# Patient Record
Sex: Female | Born: 1945 | Race: White | Hispanic: No | Marital: Married | Smoking: Former smoker
Health system: Southern US, Community
[De-identification: ages and names within clinical notes are randomized; demographics above are authoritative.]

## PROBLEM LIST (undated history)

## (undated) DIAGNOSIS — Z789 Other specified health status: Secondary | ICD-10-CM

## (undated) DIAGNOSIS — I480 Paroxysmal atrial fibrillation: Secondary | ICD-10-CM

## (undated) DIAGNOSIS — M79673 Pain in unspecified foot: Secondary | ICD-10-CM

## (undated) DIAGNOSIS — I10 Essential (primary) hypertension: Secondary | ICD-10-CM

## (undated) DIAGNOSIS — Z8669 Personal history of other diseases of the nervous system and sense organs: Secondary | ICD-10-CM

## (undated) DIAGNOSIS — I428 Other cardiomyopathies: Secondary | ICD-10-CM

## (undated) HISTORY — DX: Pain in unspecified foot: M79.673

## (undated) HISTORY — DX: Paroxysmal atrial fibrillation: I48.0

## (undated) HISTORY — DX: Other specified health status: Z78.9

## (undated) HISTORY — DX: Other cardiomyopathies: I42.8

## (undated) HISTORY — DX: Essential (primary) hypertension: I10

## (undated) HISTORY — DX: Personal history of other diseases of the nervous system and sense organs: Z86.69

## (undated) HISTORY — PX: DILATION AND CURETTAGE OF UTERUS: SHX78

---
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
## 1979-07-11 HISTORY — PX: NASAL SINUS SURGERY: SHX719

## 1979-07-11 HISTORY — PX: LAPAROSCOPIC ABDOMINAL EXPLORATION: SHX6249

## 2010-12-10 HISTORY — PX: CARDIAC CATHETERIZATION: SHX172

## 2010-12-19 ENCOUNTER — Inpatient Hospital Stay (HOSPITAL_COMMUNITY)
Admission: RE | Admit: 2010-12-19 | Discharge: 2010-12-27 | DRG: 286 | Disposition: A | Discharge status: Home / Self Care | Payer: 59 | Source: Other Acute Inpatient Hospital | Attending: Internal Medicine | Admitting: Internal Medicine

## 2010-12-19 ENCOUNTER — Ambulatory Visit (HOSPITAL_COMMUNITY): Admission: EM | Admit: 2010-12-19 | Payer: 59 | Source: Other Acute Inpatient Hospital | Admitting: Internal Medicine

## 2010-12-19 ENCOUNTER — Emergency Department (HOSPITAL_BASED_OUTPATIENT_CLINIC_OR_DEPARTMENT_OTHER)
Admission: EM | Admit: 2010-12-19 | Discharge: 2010-12-19 | Disposition: A | Discharge status: Other Acute Inpatient Hospital | Payer: 59 | Source: Home / Self Care | Attending: Emergency Medicine | Admitting: Emergency Medicine

## 2010-12-19 DIAGNOSIS — R059 Cough, unspecified: Secondary | ICD-10-CM | POA: Diagnosis present

## 2010-12-19 DIAGNOSIS — J189 Pneumonia, unspecified organism: Secondary | ICD-10-CM | POA: Diagnosis present

## 2010-12-19 DIAGNOSIS — J45909 Unspecified asthma, uncomplicated: Secondary | ICD-10-CM | POA: Diagnosis present

## 2010-12-19 DIAGNOSIS — I517 Cardiomegaly: Secondary | ICD-10-CM | POA: Diagnosis present

## 2010-12-19 DIAGNOSIS — R05 Cough: Secondary | ICD-10-CM | POA: Diagnosis present

## 2010-12-19 DIAGNOSIS — I10 Essential (primary) hypertension: Secondary | ICD-10-CM | POA: Insufficient documentation

## 2010-12-19 DIAGNOSIS — I4891 Unspecified atrial fibrillation: Secondary | ICD-10-CM | POA: Insufficient documentation

## 2010-12-19 DIAGNOSIS — I509 Heart failure, unspecified: Secondary | ICD-10-CM | POA: Diagnosis present

## 2010-12-19 DIAGNOSIS — I2589 Other forms of chronic ischemic heart disease: Secondary | ICD-10-CM | POA: Diagnosis present

## 2010-12-19 DIAGNOSIS — I5181 Takotsubo syndrome: Secondary | ICD-10-CM | POA: Diagnosis present

## 2010-12-19 DIAGNOSIS — Z7901 Long term (current) use of anticoagulants: Secondary | ICD-10-CM

## 2010-12-19 LAB — BASIC METABOLIC PANEL
BUN: 11 mg/dL (ref 6–23)
CO2: 26 mEq/L (ref 19–32)
Calcium: 9.9 mg/dL (ref 8.4–10.5)
Chloride: 106 mEq/L (ref 96–112)
Creatinine, Ser: 0.5 mg/dL (ref 0.4–1.2)
GFR calc Af Amer: >60 mL/min (ref >60)
GFR calc non Af Amer: >60 mL/min (ref >60)
Glucose, Bld: 119 mg/dL — ABNORMAL HIGH (ref 70–99)
Potassium: 4.6 mEq/L (ref 3.5–5.1)
Sodium: 145 mEq/L (ref 135–145)

## 2010-12-19 LAB — PROTIME-INR
INR: 0.98 (ref 0.00–1.49)
Prothrombin Time: 13.2 seconds (ref 11.6–15.2)

## 2010-12-19 LAB — POCT CARDIAC MARKERS
CKMB, poc: 1.9 ng/mL (ref 1.0–8.0)
Myoglobin, poc: 41.3 ng/mL (ref 12–200)
Troponin i, poc: <0.05 ng/mL (ref 0.00–0.09)

## 2010-12-19 LAB — APTT: aPTT: 29 seconds (ref 24–37)

## 2010-12-19 LAB — TSH: TSH: 1.325 u[IU]/mL (ref 0.350–4.500)

## 2010-12-19 LAB — T4, FREE: Free T4: 1.51 ng/dL (ref 0.80–1.80)

## 2010-12-19 LAB — POCT B-TYPE NATRIURETIC PEPTIDE (BNP): B Natriuretic Peptide, POC: 117 pg/mL — ABNORMAL HIGH (ref 0–100)

## 2010-12-20 ENCOUNTER — Inpatient Hospital Stay (HOSPITAL_COMMUNITY): Payer: 59

## 2010-12-20 DIAGNOSIS — I059 Rheumatic mitral valve disease, unspecified: Secondary | ICD-10-CM

## 2010-12-20 DIAGNOSIS — I4891 Unspecified atrial fibrillation: Secondary | ICD-10-CM

## 2010-12-20 LAB — HEMOGLOBIN A1C
Hgb A1c MFr Bld: 5.8 % — ABNORMAL HIGH (ref <5.7)
Mean Plasma Glucose: 120 mg/dL — ABNORMAL HIGH (ref <117)

## 2010-12-20 LAB — DIFFERENTIAL
Basophils Absolute: 0 10*3/uL (ref 0.0–0.1)
Basophils Relative: 0 % (ref 0–1)
Eosinophils Absolute: 0.1 10*3/uL (ref 0.0–0.7)
Eosinophils Relative: 1 % (ref 0–5)
Lymphocytes Relative: 25 % (ref 12–46)
Lymphs Abs: 2 10*3/uL (ref 0.7–4.0)
Monocytes Absolute: 1 10*3/uL (ref 0.1–1.0)
Monocytes Relative: 13 % — ABNORMAL HIGH (ref 3–12)
Neutro Abs: 4.9 10*3/uL (ref 1.7–7.7)
Neutrophils Relative %: 61 % (ref 43–77)

## 2010-12-20 LAB — CBC
HCT: 40.7 % (ref 36.0–46.0)
Hemoglobin: 14 g/dL (ref 12.0–15.0)
MCH: 33.4 pg (ref 26.0–34.0)
MCHC: 34.4 g/dL (ref 30.0–36.0)
MCV: 97.1 fL (ref 78.0–100.0)
Platelets: 262 10*3/uL (ref 150–400)
RBC: 4.19 MIL/uL (ref 3.87–5.11)
RDW: 12.4 % (ref 11.5–15.5)
WBC: 8.1 10*3/uL (ref 4.0–10.5)

## 2010-12-20 LAB — LIPID PANEL
Cholesterol: 124 mg/dL (ref 0–200)
HDL: 39 mg/dL — ABNORMAL LOW (ref >39)
LDL Cholesterol: 67 mg/dL (ref 0–99)
Total CHOL/HDL Ratio: 3.2 RATIO
Triglycerides: 88 mg/dL (ref <150)
VLDL: 18 mg/dL (ref 0–40)

## 2010-12-20 LAB — TSH: TSH: 1.126 u[IU]/mL (ref 0.350–4.500)

## 2010-12-20 LAB — CARDIAC PANEL(CRET KIN+CKTOT+MB+TROPI)
CK, MB: 2.5 ng/mL (ref 0.3–4.0)
CK, MB: 2.7 ng/mL (ref 0.3–4.0)
Relative Index: INVALID (ref 0.0–2.5)
Relative Index: INVALID (ref 0.0–2.5)
Total CK: 38 U/L (ref 7–177)
Total CK: 52 U/L (ref 7–177)
Troponin I: 0.02 ng/mL (ref 0.00–0.06)
Troponin I: 0.02 ng/mL (ref 0.00–0.06)

## 2010-12-20 LAB — T4, FREE: Free T4: 1.24 ng/dL (ref 0.80–1.80)

## 2010-12-20 LAB — MAGNESIUM: Magnesium: 2.5 mg/dL (ref 1.5–2.5)

## 2010-12-20 LAB — T3, FREE: T3, Free: 3.1 pg/mL (ref 2.3–4.2)

## 2010-12-21 LAB — HEPARIN LEVEL (UNFRACTIONATED)
Heparin Unfractionated: 0.2 IU/mL — ABNORMAL LOW (ref 0.30–0.70)
Heparin Unfractionated: 0.55 IU/mL (ref 0.30–0.70)

## 2010-12-21 LAB — CBC
HCT: 43.9 % (ref 36.0–46.0)
Hemoglobin: 14.8 g/dL (ref 12.0–15.0)
MCH: 32.8 pg (ref 26.0–34.0)
MCHC: 33.7 g/dL (ref 30.0–36.0)
MCV: 97.3 fL (ref 78.0–100.0)
Platelets: 291 10*3/uL (ref 150–400)
RBC: 4.51 MIL/uL (ref 3.87–5.11)
RDW: 12.5 % (ref 11.5–15.5)
WBC: 8.2 10*3/uL (ref 4.0–10.5)

## 2010-12-21 LAB — BASIC METABOLIC PANEL
BUN: 9 mg/dL (ref 6–23)
CO2: 28 mEq/L (ref 19–32)
Calcium: 8.9 mg/dL (ref 8.4–10.5)
Chloride: 109 mEq/L (ref 96–112)
Creatinine, Ser: 0.74 mg/dL (ref 0.4–1.2)
GFR calc Af Amer: >60 mL/min (ref >60)
GFR calc non Af Amer: >60 mL/min (ref >60)
Glucose, Bld: 116 mg/dL — ABNORMAL HIGH (ref 70–99)
Potassium: 5.3 mEq/L — ABNORMAL HIGH (ref 3.5–5.1)
Sodium: 143 mEq/L (ref 135–145)

## 2010-12-22 LAB — BASIC METABOLIC PANEL
BUN: 13 mg/dL (ref 6–23)
CO2: 31 mEq/L (ref 19–32)
Calcium: 9.1 mg/dL (ref 8.4–10.5)
Chloride: 109 mEq/L (ref 96–112)
Creatinine, Ser: 0.78 mg/dL (ref 0.4–1.2)
GFR calc Af Amer: >60 mL/min (ref >60)
GFR calc non Af Amer: >60 mL/min (ref >60)
Glucose, Bld: 104 mg/dL — ABNORMAL HIGH (ref 70–99)
Potassium: 5.2 mEq/L — ABNORMAL HIGH (ref 3.5–5.1)
Sodium: 145 mEq/L (ref 135–145)

## 2010-12-22 LAB — HEPARIN LEVEL (UNFRACTIONATED): Heparin Unfractionated: 0.49 IU/mL (ref 0.30–0.70)

## 2010-12-22 LAB — CBC
HCT: 45 % (ref 36.0–46.0)
Hemoglobin: 15.6 g/dL — ABNORMAL HIGH (ref 12.0–15.0)
MCH: 34.1 pg — ABNORMAL HIGH (ref 26.0–34.0)
MCHC: 34.7 g/dL (ref 30.0–36.0)
MCV: 98.5 fL (ref 78.0–100.0)
Platelets: 318 10*3/uL (ref 150–400)
RBC: 4.57 MIL/uL (ref 3.87–5.11)
RDW: 12.7 % (ref 11.5–15.5)
WBC: 9 10*3/uL (ref 4.0–10.5)

## 2010-12-23 LAB — BASIC METABOLIC PANEL
BUN: 16 mg/dL (ref 6–23)
BUN: 15 mg/dL (ref 6–23)
BUN: 16 mg/dL (ref 6–23)
CO2: 25 mEq/L (ref 19–32)
CO2: 26 mEq/L (ref 19–32)
CO2: 30 mEq/L (ref 19–32)
Calcium: 9.1 mg/dL (ref 8.4–10.5)
Calcium: 8.9 mg/dL (ref 8.4–10.5)
Calcium: 8.8 mg/dL (ref 8.4–10.5)
Chloride: 106 mEq/L (ref 96–112)
Chloride: 101 mEq/L (ref 96–112)
Chloride: 106 mEq/L (ref 96–112)
Creatinine, Ser: 0.7 mg/dL (ref 0.4–1.2)
Creatinine, Ser: 0.69 mg/dL (ref 0.4–1.2)
Creatinine, Ser: 0.89 mg/dL (ref 0.4–1.2)
GFR calc Af Amer: >60 mL/min (ref >60)
GFR calc Af Amer: >60 mL/min (ref >60)
GFR calc Af Amer: >60 mL/min (ref >60)
GFR calc non Af Amer: >60 mL/min (ref >60)
GFR calc non Af Amer: >60 mL/min (ref >60)
GFR calc non Af Amer: >60 mL/min (ref >60)
Glucose, Bld: 107 mg/dL — ABNORMAL HIGH (ref 70–99)
Glucose, Bld: 121 mg/dL — ABNORMAL HIGH (ref 70–99)
Glucose, Bld: 102 mg/dL — ABNORMAL HIGH (ref 70–99)
Potassium: 4 mEq/L (ref 3.5–5.1)
Potassium: 3.6 mEq/L (ref 3.5–5.1)
Potassium: 4.6 mEq/L (ref 3.5–5.1)
Sodium: 140 mEq/L (ref 135–145)
Sodium: 137 mEq/L (ref 135–145)
Sodium: 140 mEq/L (ref 135–145)

## 2010-12-23 LAB — CBC
HCT: 43.9 % (ref 36.0–46.0)
Hemoglobin: 15.1 g/dL — ABNORMAL HIGH (ref 12.0–15.0)
MCH: 33.1 pg (ref 26.0–34.0)
MCHC: 34.4 g/dL (ref 30.0–36.0)
MCV: 96.3 fL (ref 78.0–100.0)
Platelets: 318 10*3/uL (ref 150–400)
RBC: 4.56 MIL/uL (ref 3.87–5.11)
RDW: 12.5 % (ref 11.5–15.5)
WBC: 9.5 10*3/uL (ref 4.0–10.5)

## 2010-12-23 LAB — MAGNESIUM: Magnesium: 2.1 mg/dL (ref 1.5–2.5)

## 2010-12-23 LAB — APTT: aPTT: 32 seconds (ref 24–37)

## 2010-12-23 LAB — PROTIME-INR
INR: 1.08 (ref 0.00–1.49)
Prothrombin Time: 14.2 seconds (ref 11.6–15.2)

## 2010-12-24 LAB — CBC
HCT: 44.5 % (ref 36.0–46.0)
Hemoglobin: 15.3 g/dL — ABNORMAL HIGH (ref 12.0–15.0)
MCH: 33.9 pg (ref 26.0–34.0)
MCHC: 34.4 g/dL (ref 30.0–36.0)
MCV: 98.7 fL (ref 78.0–100.0)
Platelets: 337 10*3/uL (ref 150–400)
RBC: 4.51 MIL/uL (ref 3.87–5.11)
RDW: 12.5 % (ref 11.5–15.5)
WBC: 8.5 10*3/uL (ref 4.0–10.5)

## 2010-12-24 LAB — BASIC METABOLIC PANEL
BUN: 18 mg/dL (ref 6–23)
CO2: 26 mEq/L (ref 19–32)
Calcium: 9 mg/dL (ref 8.4–10.5)
Chloride: 106 mEq/L (ref 96–112)
Creatinine, Ser: 0.89 mg/dL (ref 0.4–1.2)
GFR calc Af Amer: >60 mL/min (ref >60)
GFR calc non Af Amer: >60 mL/min (ref >60)
Glucose, Bld: 151 mg/dL — ABNORMAL HIGH (ref 70–99)
Potassium: 4.4 mEq/L (ref 3.5–5.1)
Sodium: 139 mEq/L (ref 135–145)

## 2010-12-24 LAB — PROTIME-INR
INR: 1.09 (ref 0.00–1.49)
Prothrombin Time: 14.3 seconds (ref 11.6–15.2)

## 2010-12-24 LAB — BRAIN NATRIURETIC PEPTIDE: Pro B Natriuretic peptide (BNP): 63 pg/mL (ref 0.0–100.0)

## 2010-12-25 LAB — BASIC METABOLIC PANEL
BUN: 18 mg/dL (ref 6–23)
CO2: 29 mEq/L (ref 19–32)
Calcium: 8.8 mg/dL (ref 8.4–10.5)
Chloride: 105 mEq/L (ref 96–112)
Creatinine, Ser: 0.81 mg/dL (ref 0.4–1.2)
GFR calc Af Amer: >60 mL/min (ref >60)
GFR calc non Af Amer: >60 mL/min (ref >60)
Glucose, Bld: 103 mg/dL — ABNORMAL HIGH (ref 70–99)
Potassium: 4.4 mEq/L (ref 3.5–5.1)
Sodium: 139 mEq/L (ref 135–145)

## 2010-12-25 LAB — MAGNESIUM: Magnesium: 2.4 mg/dL (ref 1.5–2.5)

## 2010-12-25 LAB — PROTIME-INR
INR: 1.19 (ref 0.00–1.49)
Prothrombin Time: 15.3 seconds — ABNORMAL HIGH (ref 11.6–15.2)

## 2010-12-26 ENCOUNTER — Inpatient Hospital Stay (HOSPITAL_COMMUNITY): Payer: 59

## 2010-12-26 LAB — CBC
HCT: 43 % (ref 36.0–46.0)
Hemoglobin: 14.9 g/dL (ref 12.0–15.0)
MCH: 33.6 pg (ref 26.0–34.0)
MCHC: 34.7 g/dL (ref 30.0–36.0)
MCV: 96.8 fL (ref 78.0–100.0)
Platelets: 274 10*3/uL (ref 150–400)
RBC: 4.44 MIL/uL (ref 3.87–5.11)
RDW: 12.3 % (ref 11.5–15.5)
WBC: 5.8 10*3/uL (ref 4.0–10.5)

## 2010-12-26 LAB — MAGNESIUM: Magnesium: 2.1 mg/dL (ref 1.5–2.5)

## 2010-12-26 LAB — BASIC METABOLIC PANEL
BUN: 15 mg/dL (ref 6–23)
CO2: 25 mEq/L (ref 19–32)
Calcium: 8.8 mg/dL (ref 8.4–10.5)
Chloride: 107 mEq/L (ref 96–112)
Creatinine, Ser: 0.63 mg/dL (ref 0.4–1.2)
GFR calc Af Amer: >60 mL/min (ref >60)
GFR calc non Af Amer: >60 mL/min (ref >60)
Glucose, Bld: 95 mg/dL (ref 70–99)
Potassium: 4.4 mEq/L (ref 3.5–5.1)
Sodium: 141 mEq/L (ref 135–145)

## 2010-12-26 LAB — PROTIME-INR
INR: 1.19 (ref 0.00–1.49)
Prothrombin Time: 15.3 seconds — ABNORMAL HIGH (ref 11.6–15.2)

## 2010-12-27 LAB — PROTIME-INR
INR: 1.59 — ABNORMAL HIGH (ref 0.00–1.49)
Prothrombin Time: 19.1 seconds — ABNORMAL HIGH (ref 11.6–15.2)

## 2010-12-27 LAB — BASIC METABOLIC PANEL
BUN: 14 mg/dL (ref 6–23)
CO2: 30 mEq/L (ref 19–32)
Calcium: 9.2 mg/dL (ref 8.4–10.5)
Chloride: 106 mEq/L (ref 96–112)
Creatinine, Ser: 0.67 mg/dL (ref 0.4–1.2)
GFR calc Af Amer: >60 mL/min (ref >60)
GFR calc non Af Amer: >60 mL/min (ref >60)
Glucose, Bld: 100 mg/dL — ABNORMAL HIGH (ref 70–99)
Potassium: 4.8 mEq/L (ref 3.5–5.1)
Sodium: 141 mEq/L (ref 135–145)

## 2010-12-27 LAB — MAGNESIUM: Magnesium: 2.1 mg/dL (ref 1.5–2.5)

## 2010-12-29 ENCOUNTER — Encounter (INDEPENDENT_AMBULATORY_CARE_PROVIDER_SITE_OTHER): Payer: 59

## 2010-12-29 ENCOUNTER — Encounter: Payer: Self-pay | Admitting: Cardiovascular Disease

## 2010-12-29 DIAGNOSIS — Z7901 Long term (current) use of anticoagulants: Secondary | ICD-10-CM

## 2010-12-29 DIAGNOSIS — I4891 Unspecified atrial fibrillation: Secondary | ICD-10-CM

## 2010-12-29 LAB — CONVERTED CEMR LAB: POC INR: 2.3

## 2010-12-31 NOTE — H&P (Signed)
Maria Arnold, MAINWARING NO.:  [id number]  MEDICAL RECORD NO.:  [id number]           PATIENT TYPE:  LOCATION:                                 FACILITY:  PHYSICIAN:  Bevelyn Buckles. Bensimhon, MDDATE OF BIRTH:  1946-02-14  DATE OF ADMISSION: DATE OF DISCHARGE:                             HISTORY & PHYSICAL   PRIMARY CARE PHYSICIAN:  Dr. Kathrynn Running.  OB/GYN:  Dr. Lamonte Sakai.  REASON FOR ADMISSION:  Upper respiratory tract infection, atrial fibrillation with rapid ventricular response.  Maria Arnold is a delightful 65 year old woman who is the cousin of Dr. Marca Ancona who is here at the bedside during her presentation.  She has a history of hypertension.  She denies any history of known heart disease.  Early in the week, she developed upper respiratory tract infection symptoms.  She had a productive cough with yellow sputum and low-grade temperatures.  She was started on Biaxin.  She has also been under incredible amount of stress as her daughter lost pregnancy at 5 months. She has been watching her three grand kids this week in the interim.  Over the past 2 days, she has had worsening cough and low-grade temperatures and marked increase in her shortness of breath.  She has had to sleep upright with a lot of congestion.  She went to her primary care doctor today to have the cough evaluated.  She had initial chest x- ray which she as told there was no infiltrate, but there is a question of a nipple shadow.  She finally got a followup chest x-ray which looked okay except for mild cardiomegaly.  While at her primary care doctor, she also got a nebulizer which made her feel quite jittery.  She denied any frank palpitations.  Of note, she has also been taking albuterol and pro-inhalers and some antihistamines throughout the week.  She then got an EKG at her primary care doctor's office which showed atrial fibrillation with a rapid ventricular response with a heart rate  of 150. She went to the [facility] ER.  She was given diltiazem 25 mg IV and started on diltiazem drip and started on heparin.  Her initial EKG did show some mild ST depression.  Followup EKG showed a heart rate of 100 with mild T-wave inversions in V5 through V6.  On review of symptoms, she denies any chest pain.  She has not had any nausea and vomiting.  No syncope or presyncope.  Remainder review of systems are negative except for HPI and problem list.  PROBLEM LIST: 1. History of sinus disease. 2. Borderline hypertension, treated without medicines.  CURRENT MEDICATIONS:  ProAir inhaler and antihistamines.  ALLERGIES:  No known drug allergies.  SOCIAL HISTORY:  She is married.  She has three kids, former smoker but quit 35 years ago.  Social alcohol.  FAMILY HISTORY:  Denies any history of premature coronary artery disease or atrial fibrillation.  PHYSICAL EXAMINATION:  GENERAL:  She is sitting in bed no acute distress.  She does seem quite congested with occasional coughing. VITAL SIGNS:  Blood pressure is 133/65, heart  rate is 100.  She is sating 99% on room air. HEENT:  Normal. NECK:  Supple.  No JVD.  Carotids are 2+ bilaterally.  No obvious bruits.  There is no lymphadenopathy or thyromegaly. CARDIAC:  Regular.  No murmurs or gallops. LUNGS:  Significant crackles and wheezing, mostly localized in the left lower lobe, although she does have some mild generalized wheeze. ABDOMEN:  Soft, nontender, nondistended.  No hepatosplenomegaly.  No bruits.  No masses.  Good bowel sounds. EXTREMITIES:  Warm with no cyanosis, clubbing, or edema.  No rash. NEUROLOGIC:  Alert and oriented x3.  Cranial nerves are grossly intact. Moves all four extremities without difficulty. PSYCHIATRIC:  Affect is very pleasant.  EKG shows atrial fibrillation with a rate of 100.  She has some mild T- wave inversions in V4 through V6.  Chest x-ray showed some mild cardiomegaly by  report, we are repeating this.  Sodium 145, potassium 4.6, creatinine of 0.5, BUN 11.  BNP is 117.  CBC is pending.  ASSESSMENT: 1. Atrial fibrillation with rapid ventricular response, duration     unclear.  There is mild associated EKG changes.  Congestive heart     failure, hypertension, age, diabetes, prior stroke-2 score is 1 for     borderline hypertension. 2. Upper respiratory tract infection, question left lower lobe     pneumonia. 3. Borderline hypertension.  PLAN/DISCUSSION:  I suspect her AFib is likely secondary to her upper respiratory tract infection and albuterol use.  Unfortunately, the exact duration of her atrial fibrillation is unknown.  For now, we will continue Cardizem.  We will stop her heparin and start Pradaxa. Hopefully, she will cardiovert on her own.  If she does not cardiovert overnight, we would continue Pradaxa and consider cardioversion as needed in 4 weeks.  If she does convert soon, I would stop the anticoagulation as atrial fibrillation is likely transient due to her respiratory tract infection and albuterol.  We will check a TSH, echocardiogram, and rule out ischemia with serial cardiac markers.  For her upper respiratory tract infection, we will treat her with Avelox as well as Xopenex and Atrovent.  Further disposition pending the results of her workup.  We will repeat the chest x-ray in the morning.     Bevelyn Buckles. Bensimhon, MD     DRB/MEDQ  D:  12/20/2010  T:  12/20/2010  Job:  [id number]  Electronically Signed by Arvilla Meres MD on 12/31/2010 01:58:36 PM

## 2010-12-31 NOTE — Procedures (Signed)
Maria Arnold, KALAMA NO.:  [id number]  MEDICAL RECORD NO.:  [id number]           PATIENT TYPE:  I  LOCATION:  6523                         FACILITY:  [facility]  PHYSICIAN:  Maria Morton. Riley Kill, MD, FACCDATE OF BIRTH:  04/10/1946  DATE OF PROCEDURE:  12/22/2010 DATE OF DISCHARGE:                           CARDIAC CATHETERIZATION   INDICATIONS:  Maria Arnold is a very delightful 65 year old, who presents with shortness of breath, atrial fibrillation with rapid ventricular response.  Echocardiography demonstrates reduced overall LV systolic function.  The current study was done to assess coronary anatomy.  We discussed the various risks and alternatives.  She is a relative of Maria Arnold.  PROCEDURES: 1. Left heart catheterization. 2. Selective coronary arteriography. 3. Selective left ventriculography.  DESCRIPTION OF PROCEDURE:  The patient was brought to the Catheterization Laboratory after informed consent, and prepped and draped in usual fashion.  Through an anterior puncture, the right radial artery was entered and a 5-French sheath was placed.  3 mg of intra- arterial verapamil were administered.  In addition, 3500 units or 50 units per kilo of heparin was also given.  Following this, views of the right and left coronary arteries were obtained in multiple angiographic projections.  A 3.05-French guide was utilized to cannulate the left coronary.  There was good seating in this catheter.  Central aortic and left ventricular pressures were measured with pigtail.  Ventriculography was performed in the RAO projection.  I then reviewed the pictures with the patient, and subsequently her husband in the film viewing room.  She tolerated the procedure without complication.  HEMODYNAMIC DATA: 1. Central aortic pressure 133/84, mean 106. 2. LV pressure 145/22. 3. There was no gradient or pullback across the aortic valve.  ANGIOGRAPHIC DATA: 1. Ventriculography  done in the RAO projection reveals significant     reduction in overall left ventricular systolic function.  The     anterior and inferior bases move reasonably well.  There appears to     be some apical ballooning with almost near dyskinesis of the apex.     Overall ejection fraction would be estimated at around 30-35%.     There may be some mild mitral regurgitation. 2. The left main is a large-caliber vessel and free of critical     disease. 3. The left anterior descending artery courses to the apex.  There are     two major diagonal branches and septal a perforator.  There is a     smaller proximal diagonal branch all appear smooth and without     critical disease. 4. The circumflex provides tiny first marginal, and a second marginal     that goes out over the anterolateral segment.  The circumflex     appears to be smooth and without the critical narrowing.  No areas     of critical stenosis are noted.  CONCLUSIONS: 1. Moderate reduction in overall global systolic function with near     akinesis of the mid and distal anterolateral, apical, and distal     inferior wall suggestive of apical ballooning and/or takotsubo. 2. No significant  coronary obstruction.  DISPOSITION:  The patient has atrial fibrillation.  The exact sequence of events are not clear.  Certainly, the angiographic study suggests possible takotsubo, and there is a potential precipitating event.  It could also represent a cardiomyopathy from atrial fibrillation, but it is not a diffuse hypokinesis, which is present.  Treatment of atrial fibrillation will be required.  I have discussed the case with Maria Arnold.     Maria Morton. Riley Kill, MD, [facility]     TDS/MEDQ  D:  12/22/2010  T:  12/23/2010  Job:  [id number]  cc:   Maria Ancona, MD Maria Arnold [facility] CV Laboratory  Electronically Signed by Maria Pons MD [facility] on 12/31/2010 09:11:45 PM

## 2011-01-02 ENCOUNTER — Encounter (INDEPENDENT_AMBULATORY_CARE_PROVIDER_SITE_OTHER): Payer: 59

## 2011-01-02 ENCOUNTER — Telehealth: Payer: Self-pay | Admitting: Cardiology

## 2011-01-02 ENCOUNTER — Encounter: Payer: Self-pay | Admitting: Internal Medicine

## 2011-01-02 DIAGNOSIS — I4891 Unspecified atrial fibrillation: Secondary | ICD-10-CM

## 2011-01-02 DIAGNOSIS — Z7901 Long term (current) use of anticoagulants: Secondary | ICD-10-CM

## 2011-01-02 LAB — CONVERTED CEMR LAB: POC INR: 2

## 2011-01-02 NOTE — Discharge Summary (Signed)
NAMEVINETTE, CRITES NO.:  [id number]  MEDICAL RECORD NO.:  [id number]           PATIENT TYPE:  LOCATION:                                 FACILITY:  PHYSICIAN:  Marca Ancona, MD      DATE OF BIRTH:  10-Apr-1946  DATE OF ADMISSION: DATE OF DISCHARGE:                              DISCHARGE SUMMARY   DATE OF ADMISSION:  December 19, 2010 DATE OF DISCHARGE:  December 27, 2010  PRIMARY CARDIOLOGIST:  Marca Ancona, MD.  DISCHARGE DIAGNOSES: 1. Paroxysmal atrial fibrillation with rapid ventricular response.     a.     Successfully chemically converted to normal sinus rhythm, on      dofetilide.     b.     Coumadin anticoagulation initiated, with Lovenox overlap at      discharge. 2. Status post acute congestive heart failure.     a.     Question tachycardia-mediated versus Takotsubo cardiomyopathy.     b.     Normal coronary arteries by catheterization, this admission;      EF approximately 30%, with apical ballooning. 3. Asthmatic bronchitis.  SECONDARY DIAGNOSES:  1. Borderline hypertension.  REASON FOR ADMISSION:  The patient is a 65 year old female, with no prior history of heart disease, who presented to the emergency room with complaint of worsening cough and low-grade fever, as well as progressive shortness of breath.  She was found to be in new-onset atrial fibrillation  with rapid ventricular response, of unknown duration, with associated  congestive heart failure.  HOSPITAL COURSE:  The patient was initially treated with IV diltiazem for rate control, and started on Pradaxa anticoagulation.  It was felt that her dysrhythmia was most likely triggered by an underlying lung infection, and perhaps exacerbated by albuterol use.  In the meanwhile, the patient was treated for bronchitis, with question of a possible pneumonia, and placed on Atrovent nebulizer support.  A 2-D echocardiogram was done, indicating cardiomyopathy with EF 30% to 35%, but  in the setting of atrial fibrillation, with regional wall motion abnormalities and probable akinesis of the distal anteroseptal/apical myocardium.  Given this finding, recommendation was to exclude coronary artery disease, and patient underwent coronary angiography on hospital day #3, by Dr. Shawnie Pons, revealing normal coronary arteries and apical ballooning (EF approximately 30%), suggestive of Takotsubo cardiomyopathy.  The patient continued to remain in atrial fibrillation, and plan was now to attempt TEE-DC cardioversion.  This was performed on hospital day #4, by Dr. Olga Millers, resulting in transient restoration of normal sinus rhythm, after successive shocks of 120 and 150 joules.  Given this, plan was then to initiate antiarrhythmic therapy with Tikosyn.  She  successfully converted to normal sinus rhythm after the first dose, wherein  she remained by time of discharge, on hospital day #8.  The patient remained on Lovenox overlap with Coumadin, with intention to continue Lovenox overlap until she maintained an INR greater than 2.  Of note, there was some prolongation of the QT interval, requiring temporary  hold of a dofetilide dose, and then resumption at 250 mcg q.12 h.  The patient was also initially treated with Lasix for congestive heart failure, which was then discontinued, following clinical evidence of euvolemia.  On hospital day #8, the patient was cleared for discharge, by Dr. Shirlee Latch, with a discharge INR level of 1.6.  The patient will continue on Lovenox overlap through the weekend, with recommended early followup pro time on Monday, February 20, at the [facility]. Additional doses of Lovenox will be provided at that time, if she has not yet obtained therapeutic INR levels.  DISCHARGE LABORATORY DATA:  INR 1.59.  Potassium 4.8, BUN 14, creatinine 0.67.  Outstanding labs: TSH 1.13.  Lipid profile:  Total cholesterol 124,  triglyceride 80, HDL 39, and LDL 67.  Cardiac markers normal.  Hemoglobin A1c 5.8.  CBC normal on admission.  Final chest x-ray, February 17:  Mild cardiomegaly; left lower lobe scarring; no acute cardiopulmonary disease.  DISPOSITION:  Stable.  FOLLOWUP: 1. [facility] on Monday, February 20, at 12:30     p.m. 2. Dr. Marca Ancona, February 28, at 3:30 p.m. 3. Follow up with [facility] in the next 2 weeks.  Patient      to call and arrange followup.  DISCHARGE MEDICATIONS: 1. Carvedilol 3.125 mg b.i.d. 2. Vantin 200 mg b.i.d. (times 8 days). 3. Dofetilide 0.25 mg b.i.d. 4. Lovenox 65 mg subcutaneously q.12 h. (until further instructions). 5. Atrovent MDI inhaler 2 puffs q.4 h. p.r.n. 6. Lisinopril 2.5 mg b.i.d. 7. Coumadin 7.5 mg as directed.  DURATION OF DISCHARGE ENCOUNTER:  Greater than 30 minutes, including physician time.     Gene Serpe, PA-C   ______________________________ Marca Ancona, MD    GS/MEDQ  D:  12/27/2010  T:  12/27/2010  Job:  [id number]  cc:   Dr. Kathrynn Running Dr. Lamonte Sakai  Electronically Signed by Rozell Searing PA-C on 12/30/2010 10:48:08 AM Electronically Signed by Marca Ancona MD on 01/02/2011 11:02:41 AM

## 2011-01-06 ENCOUNTER — Encounter: Payer: 59 | Admitting: Cardiology

## 2011-01-06 NOTE — Progress Notes (Signed)
Summary: PHARMACIST HAVE QUESTION RE MEDS  Phone Note Refill Request Call back at [phone] Message from:  Pharmacy on January 02, 2011 3:08 PM  Refills Requested: Medication #1:  TIKOSYN 250 MCG CAPS Take one capsule by mouth twice a day PHARMACIST STATES IT WILL NEED SAMPLES FOR FIVE DAYS UNTIL THEY CAN GET THE RX IN   Method Requested: Telephone to Pharmacy Initial call taken by: Roe Coombs,  January 02, 2011 3:08 PM  Follow-up for Phone Call        PHARMACISTS AWARE WILL LEAVE TIKOSYN 250 MCG 10 TABS  AT FRONT DESK FOR PT TO PICK UP. PHARMACY TO INFORM PT OF ABOVE. Follow-up by: Scherrie Bateman, LPN,  January 02, 2011 3:27 PM

## 2011-01-06 NOTE — Medication Information (Signed)
Summary: inr check/per dayna/mclean=mj  Anticoagulant Therapy  Managed by: Cloyde Reams, RN, BSN Referring MD: Dr Shirlee Latch PCP: Dr Senaida Ores MD: Clifton James MD, Cristal Deer Indication 1: Atrial Fibrillation Lab Used: [facility] [place] Site: [address] INR POC 2.3 INR RANGE 2.0-3.0  Dietary changes: yes       Details: Pt's diet includes lots of vit K rich foods, green tea and shakes.  Pt educated on importance of dietary consistancy of vit K.   Health status changes: no    Bleeding/hemorrhagic complications: yes       Details: Educated pt to monitor for s+s of abnormal bleeding and call with problems.    Any changes in medication regimen? yes       Details: Medication list updated, advised pt to call with any medication changes. Currently on Vantin abx x 8 doses at discharge on 12/27/10.   Recent/future dental: no  Any missed doses?: yes     Details: Educated pt on importance of medication compliance, and consistancy taking medications.   Is patient compliant with meds? yes      Comments: Pt discharged on 12/27/10 INR 1.59 discharged on 7.5mg  daily and Lovenox 65mg  two times a day until INR therapeutic. Pt was given 2/13 5mg  INR 1.08, 2/14 5mg  INR 1.09, 2/15 7.5mg  INR 1.19, 2/16 7.5mg  INR 1.19, 2/17 10mg  INR 1.59  Current Medications (verified): 1)  Carvedilol 3.125 Mg Tabs (Carvedilol) .... Take One Tablet By Mouth Twice A Day 2)  Tikosyn 250 Mcg Caps (Dofetilide) .... Take One Capsule By Mouth Twice A Day 3)  Atrovent Hfa 17 Mcg/act Aers (Ipratropium Bromide Hfa) .... Inhale 2 Puffs Every 4 Hrs As Needed 4)  Lisinopril 2.5 Mg Tabs (Lisinopril) .... Take One Tablet By Mouth Two Times A Day 5)  Warfarin Sodium 7.5 Mg Tabs (Warfarin Sodium) .... Use As Directed By Anticoagulation Clinic 6)  Multivitamins  Tabs (Multiple Vitamin) .... Take 1 Tablet By Mouth Once A Day  Anticoagulation Management History:      The patient comes in today for her initial visit  for anticoagulation therapy.  Negative risk factors for bleeding include an age less than 3 years old.  The bleeding index is 'low risk'.  Negative CHADS2 values include Age > 78 years old.  Anticoagulation responsible provider: Clifton James MD, Cristal Deer.  INR POC: 2.3.    Anticoagulation Management Assessment/Plan:      The patient's current anticoagulation dose is Warfarin sodium 7.5 mg tabs: Use as directed by Anticoagulation Clinic.  The next INR is due 01/02/2011.  Results were reviewed/authorized by Cloyde Reams, RN, BSN.  She was notified by Cloyde Reams, RN, BSN.         Current Anticoagulation Instructions: INR 2.3  Discussed dosage with Kennon Rounds, PharmD called pt advised to start taking 7.5mg  daily except 3.75mg  on Mondays.  Advised to go ahead and start incorportaing normal vit K diet back into daily routine.  Recheck INR on Friday.

## 2011-01-06 NOTE — Medication Information (Signed)
Summary: coumadin  Anticoagulant Therapy  Managed by: Weston Brass, PharmD Referring MD: Dr Shirlee Latch PCP: Dr Senaida Ores MD: Tenny Craw MD, Gunnar Fusi Indication 1: Atrial Fibrillation Lab Used: [facility] [place] Site: [address] INR POC 2.0 INR RANGE 2.0-3.0  Dietary changes: no    Health status changes: no    Bleeding/hemorrhagic complications: no    Recent/future hospitalizations: no    Any changes in medication regimen? no    Recent/future dental: no  Any missed doses?: no       Is patient compliant with meds? yes       Anticoagulation Management History:      The patient is taking warfarin and comes in today for a routine follow up visit.  Negative risk factors for bleeding include an age less than 72 years old.  The bleeding index is 'low risk'.  Negative CHADS2 values include Age > 31 years old.  Anticoagulation responsible provider: Tenny Craw MD, Gunnar Fusi.  INR POC: 2.0.  Cuvette Lot#: [id number].    Anticoagulation Management Assessment/Plan:      The patient's current anticoagulation dose is Warfarin sodium 7.5 mg tabs: Use as directed by Anticoagulation Clinic.  The next INR is due 01/08/2011.  Results were reviewed/authorized by Weston Brass, PharmD.  She was notified by Margot Chimes PharmD Candidate.         Prior Anticoagulation Instructions: INR 2.3  Discussed dosage with Kennon Rounds, PharmD called pt advised to start taking 7.5mg  daily except 3.75mg  on Mondays.  Advised to go ahead and start incorportaing normal vit K diet back into daily routine.  Recheck INR on Friday.    Current Anticoagulation Instructions: INR 2.0  Continue to take 1 tablet daily except for 1/2 tablet on Mondays.  Recheck INR in 1 week.

## 2011-01-09 ENCOUNTER — Encounter: Payer: Self-pay | Admitting: Cardiovascular Disease

## 2011-01-09 ENCOUNTER — Encounter: Payer: Self-pay | Admitting: Cardiology

## 2011-01-09 ENCOUNTER — Encounter (INDEPENDENT_AMBULATORY_CARE_PROVIDER_SITE_OTHER): Payer: 59 | Admitting: Cardiology

## 2011-01-09 ENCOUNTER — Encounter (INDEPENDENT_AMBULATORY_CARE_PROVIDER_SITE_OTHER): Payer: 59

## 2011-01-09 ENCOUNTER — Other Ambulatory Visit: Payer: Self-pay | Admitting: Cardiology

## 2011-01-09 DIAGNOSIS — I4891 Unspecified atrial fibrillation: Secondary | ICD-10-CM

## 2011-01-09 DIAGNOSIS — Z7901 Long term (current) use of anticoagulants: Secondary | ICD-10-CM

## 2011-01-09 DIAGNOSIS — I5022 Chronic systolic (congestive) heart failure: Secondary | ICD-10-CM | POA: Insufficient documentation

## 2011-01-09 LAB — BASIC METABOLIC PANEL
BUN: 17 mg/dL (ref 6–23)
CO2: 27 mEq/L (ref 19–32)
Calcium: 9 mg/dL (ref 8.4–10.5)
Chloride: 106 mEq/L (ref 96–112)
Creatinine, Ser: 0.5 mg/dL (ref 0.4–1.2)
GFR: 175.44 mL/min (ref >60.00)
Glucose, Bld: 97 mg/dL (ref 70–99)
Potassium: 4.2 mEq/L (ref 3.5–5.1)
Sodium: 141 mEq/L (ref 135–145)

## 2011-01-09 LAB — CONVERTED CEMR LAB: POC INR: 1.8

## 2011-01-12 ENCOUNTER — Telehealth: Payer: Self-pay | Admitting: Cardiology

## 2011-01-15 NOTE — Medication Information (Signed)
Summary: coumadin appt with mclean at 12:30/sl  Anticoagulant Therapy  Managed by: Bethena Midget, RN, BSN Referring MD: Dr Shirlee Latch PCP: Dr Senaida Ores MD: Clifton James MD, Cristal Deer Indication 1: Atrial Fibrillation Lab Used: [facility] [place] Site: [address] INR POC 1.8 INR RANGE 2.0-3.0  Dietary changes: no    Health status changes: no    Bleeding/hemorrhagic complications: no    Recent/future hospitalizations: no    Any changes in medication regimen? yes       Details: Finished ABX- Cefpodoxime 200mg s   Recent/future dental: no  Any missed doses?: no       Is patient compliant with meds? yes       Anticoagulation Management History:      The patient is taking warfarin and comes in today for a routine follow up visit.  Negative risk factors for bleeding include an age less than 91 years old.  The bleeding index is 'low risk'.  Negative CHADS2 values include Age > 69 years old.  Anticoagulation responsible provider: Clifton James MD, Cristal Deer.  INR POC: 1.8.  Cuvette Lot#: [id number].  Exp: 11/2011.    Anticoagulation Management Assessment/Plan:      The patient's current anticoagulation dose is Warfarin sodium 7.5 mg tabs: Use as directed by Anticoagulation Clinic.  The next INR is due 01/16/2011.  Anticoagulation instructions were given to patient.  Results were reviewed/authorized by Bethena Midget, RN, BSN.  She was notified by Bethena Midget, RN, BSN.         Prior Anticoagulation Instructions: INR 2.0  Continue to take 1 tablet daily except for 1/2 tablet on Mondays.  Recheck INR in 1 week.     Current Anticoagulation Instructions: INR 1.8 Today take 1.5 tablet then change dose to 1 tablet daily.  Recheck in one week.

## 2011-01-16 ENCOUNTER — Encounter (INDEPENDENT_AMBULATORY_CARE_PROVIDER_SITE_OTHER): Payer: 59

## 2011-01-16 ENCOUNTER — Encounter: Payer: Self-pay | Admitting: Cardiology

## 2011-01-16 DIAGNOSIS — I4891 Unspecified atrial fibrillation: Secondary | ICD-10-CM

## 2011-01-16 DIAGNOSIS — Z7901 Long term (current) use of anticoagulants: Secondary | ICD-10-CM

## 2011-01-16 LAB — CONVERTED CEMR LAB: POC INR: 1.8

## 2011-01-19 ENCOUNTER — Encounter: Payer: Self-pay | Admitting: Cardiology

## 2011-01-19 ENCOUNTER — Ambulatory Visit (INDEPENDENT_AMBULATORY_CARE_PROVIDER_SITE_OTHER): Payer: 59 | Admitting: Cardiology

## 2011-01-19 DIAGNOSIS — I5022 Chronic systolic (congestive) heart failure: Secondary | ICD-10-CM

## 2011-01-19 DIAGNOSIS — I4891 Unspecified atrial fibrillation: Secondary | ICD-10-CM

## 2011-01-20 NOTE — Medication Information (Signed)
Summary: rov/tm  Anticoagulant Therapy  Managed by: Bethena Midget, RN, BSN Referring MD: Dr Shirlee Latch PCP: Dr. Senaida Ores MD: Riley Kill MD, Maisie Fus Indication 1: Atrial Fibrillation Lab Used: [facility] [place] Site: [address] INR POC 1.8 INR RANGE 2.0-3.0  Dietary changes: no    Health status changes: no    Bleeding/hemorrhagic complications: no    Recent/future hospitalizations: no    Any changes in medication regimen? no    Recent/future dental: no  Any missed doses?: no       Is patient compliant with meds? yes       Allergies: No Known Drug Allergies  Anticoagulation Management History:      The patient is taking warfarin and comes in today for a routine follow up visit.  Negative risk factors for bleeding include an age less than 79 years old.  The bleeding index is 'low risk'.  Positive CHADS2 values include History of CHF.  Negative CHADS2 values include Age > 69 years old.  Anticoagulation responsible provider: Riley Kill MD, Maisie Fus.  INR POC: 1.8.  Cuvette Lot#: [id number].  Exp: 01/2011.    Anticoagulation Management Assessment/Plan:      The patient's current anticoagulation dose is Warfarin sodium 7.5 mg tabs: Use as directed by Anticoagulation Clinic.  The next INR is due 01/22/2011.  Anticoagulation instructions were given to patient.  Results were reviewed/authorized by Bethena Midget, RN, BSN.  She was notified by Bethena Midget, RN, BSN.         Prior Anticoagulation Instructions: INR 1.8 Today take 1.5 tablet then change dose to 1 tablet daily.  Recheck in one week.   Current Anticoagulation Instructions: INR 1.8 Change dose to 1 tablet everyday except 1.5 tablets on Mondays and Fridays. Recheck in one week.

## 2011-01-20 NOTE — Assessment & Plan Note (Signed)
Summary: eph/per dayna/mclean double book=mj   Visit Type:  Follow-up Primary Provider:  Dr. Kathrynn Running  CC:  no cardiac complaints-since she has been out of the hosipital.  History of Present Illness: 65 yo with recent hospital admission for atrial fibrillation and CHF presents for followup.  Patient was admitted in 2/12.  For about a week prior to admission, she had had congestion and shortness of breath.  She thought she had a bad cold.  She was seen by her PCP and ECG showed atrial fibrillation with RVR.  She was admitted to [facility] for evaluation.  CXR did not show PNA.  She had wheezing consistent with a prior asthma diagnosis.  She was then noted by echo to have EF 30-35% with peri-apical severe hypokinesis.  LHC was done, there was no angiographic coronary disease.  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  She had suffered an emotion shock about a week prior to admission when her daughter-in-law had a miscarriage.    She underwent TEE-guided cardioversion, but this failed to convert her to NSR.  She was then started on Tikosyn, which did convert her to NSR but her QTc prolonged in the setting of using moxifloxacin for acute bronchitis treatment.  Moxifloxacin was stopped and the dofetilide dose was cut back to 250 micrograms two times a day.  At this dose, QTc was normal.  She was also diuresed for mild CHF.    Since discharge home, she has been doing well symptomatically.  Wheezing and congestion has resolved.  No exertional dyspnea or chest pain.  The night before last, she felt tachypalpitations and irregular heart rate for a couple of hours.  Today, she again noted tachypalpitations and an irregular pulse and was in atrial fibrillation with rate 101 initially today.  During the course of the appointment, she converted back to NSR in the 70s.    ECG: atrial fib at 101 with normal QTc.  She went back into NSR by the end of the  appointment.   Labs (2/12): K 4.8, creatinine 0.67  Current Medications (verified): 1)  Carvedilol 3.125 Mg Tabs (Carvedilol) .... Take One Tablet By Mouth Twice A Day 2)  Tikosyn 250 Mcg Caps (Dofetilide) .... Take One Capsule By Mouth Twice A Day 3)  Lisinopril 2.5 Mg Tabs (Lisinopril) .... Take One Tablet By Mouth Two Times A Day 4)  Warfarin Sodium 7.5 Mg Tabs (Warfarin Sodium) .... Use As Directed By Anticoagulation Clinic 5)  Multivitamins  Tabs (Multiple Vitamin) .... Take 1 Tablet By Mouth Once A Day 6)  Vit B .... Daily 7)  Vit C .... Daily  Allergies (verified): No Known Drug Allergies  Past History:  Past Medical History: 1. Paroxysmal atrial fibrillation with rapid ventricular response.  She is on coumadin and dofetilide.  2. Cardiomyopathy: Nonischemic.  LHC (2/12): No coronary disease, EF 30-35% with periapical severe hypokinesis.  Echo (2/12): EF 30-35%, periapical severe hypokinesis, normal RV, mild to moderate MR.  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  3. Asthma 4. HTN  Family History: No premature CAD  Social History: Lives in [place] with husband.  2 children, 3 grandchildren.  Nonsmoker, occasional ETOH.   Review of Systems       All systems reviewed and negative except as per HPI.   Vital Signs:  Patient profile:   65 year old female Height:      13  inches Weight:      144 pounds BMI:     26.43 Pulse rate:   101 / minute BP sitting:   119 / 61 Cuff size:   regular  Vitals Entered By: Laurance Flatten CMA (January 09, 2011 1:00 PM)  Physical Exam  General:  Well developed, well nourished, in no acute distress. Head:  normocephalic and atraumatic Nose:  no deformity, discharge, inflammation, or lesions Mouth:  Teeth, gums and palate normal. Oral mucosa normal. Neck:  Neck supple, no JVD. No masses, thyromegaly or abnormal cervical nodes. Lungs:  Clear bilaterally  to auscultation and percussion. Heart:  Non-displaced PMI, chest non-tender; regular rate and rhythm, S1, S2 without murmurs, rubs or gallops. Carotid upstroke normal, no bruit.  Pedals normal pulses. No edema, no varicosities. Abdomen:  Bowel sounds positive; abdomen soft and non-tender without masses, organomegaly, or hernias noted. No hepatosplenomegaly. Extremities:  No clubbing or cyanosis. Neurologic:  Alert and oriented x 3. Skin:  Intact without lesions or rashes. Psych:  Normal affect.   Impression & Recommendations:  Problem # 1:  ATRIAL FIBRILLATION (ICD-427.31) Paroxysmal atrial fibrillation.  Has had a couple of recent episodes noted at home.  She was initially in atrial fibrillation today then went back into sinus rhythm.  As her cardiomyopathy may be tachycardia-mediated, I would like to keep her out of atrial fibrillation.  Today, I will increase her Coreg to 6.25 mg daily to try to control the rate better when she does go into fibrillation.  She is on dofetilide 250 micrograms two times a day and getting some breakthrough.  QTc today is ok.  We could potentially try to increase her dofetilide back to 500 micrograms two times a day as I think that her QTc prolongation was due to concomitant initial use of moxifloxacin.  However, this would require re-hospitalization which she understandably is not excited about.   I recommended an event monitor for 2 weeks to see how much breakthrough she is getting and an atrial fibrillation ablation if she is having a significant number of episodes.  She wants to see how she does over the next couple of weeks before doing anything different.  I will see her back in 2 weeks.  She will continue coumadin but could switch to Pradaxa after completing 1 month coumadin post-chemical cardioversion.  BMET today.   Problem # 2:  SYSTOLIC HEART FAILURE, CHRONIC (ICD-428.22) EF 30-35% with peri-apical severe hypokinesis.  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  No coronary disease on cath.  She has minimal symptoms today (NYHA class I-II).  Will continue current dose of lisinopril.  Will try to increase Coreg to 6.25 mg two times a day though she has had some difficulty with uptitration of cardiac meds.   Other Orders: TLB-BMP (Basic Metabolic Panel-BMET) (80048-METABOL)  Patient Instructions: 1)  Your physician has recommended you make the following change in your medication:  2)  Increase Coreg(carvedilol) to 6.25mg  twice a day. You can take two 3.125mg  tablets twice a day. 3)  Lab today--BMP 427.31 4)  Appointment with Dr Magdalene Patricia March 12,2012 at 12:15pm. Prescriptions: COREG 6.25 MG TABS (CARVEDILOL) one twice a day  #60 x 6   Entered by:   Katina Dung, RN, BSN   Authorized by:   Marca Ancona, MD   Signed by:   Katina Dung, RN, BSN on 01/09/2011   Method used:   [facility]        [facility]  [address] [phone][facility] ([facility]ail[facility]       [address]       [place], [place]  [address]       Ph: [phone] or [phone]       Fax: [phone]   RxID:   [id number]

## 2011-01-20 NOTE — Progress Notes (Signed)
Summary: rtn call back  Phone Note Call from Patient Call back at Home Phone [phone]   Caller: Patient Reason for Call: Talk to Nurse, Lab or Test Results Details for Reason: rtn called back from anne.  Initial call taken by: Lorne Skeens,  January 12, 2011 8:09 AM  Follow-up for Phone Call        I talked with pt--pt states she had some lightheadedness starting Saturday after she increased her Coreg to 6.25mg  twice --her BP has been in the 120-140/57-60 range --her heart rate has been in the 47-48 range--I reviewed with Dr Patrece Tallie--pt will decrease Coreg to 3.125mg  twice a day--she will continue to monitor her BP and heart rate--she will call in the morning if still having symptoms of lightheadedness and heart rate is still in the 47-48 range    New/Updated Medications: COREG 3.125 MG TABS (CARVEDILOL) one twice a day    Current Medications (verified): 1)  Coreg 3.125 Mg Tabs (Carvedilol) .... One Twice A Day 2)  Tikosyn 250 Mcg Caps (Dofetilide) .... Take One Capsule By Mouth Twice A Day 3)  Lisinopril 2.5 Mg Tabs (Lisinopril) .... Take One Tablet By Mouth Two Times A Day 4)  Warfarin Sodium 7.5 Mg Tabs (Warfarin Sodium) .... Use As Directed By Anticoagulation Clinic 5)  Multivitamins  Tabs (Multiple Vitamin) .... Take 1 Tablet By Mouth Once A Day 6)  Vit B .... Daily 7)  Vit C .... Daily  Allergies: No Known Drug Allergies

## 2011-01-22 ENCOUNTER — Encounter: Payer: Self-pay | Admitting: Cardiology

## 2011-01-22 ENCOUNTER — Encounter: Payer: 59 | Admitting: Cardiology

## 2011-01-22 ENCOUNTER — Encounter (INDEPENDENT_AMBULATORY_CARE_PROVIDER_SITE_OTHER): Payer: 59

## 2011-01-22 ENCOUNTER — Encounter: Payer: Self-pay | Admitting: Internal Medicine

## 2011-01-22 DIAGNOSIS — Z7901 Long term (current) use of anticoagulants: Secondary | ICD-10-CM

## 2011-01-22 DIAGNOSIS — I4891 Unspecified atrial fibrillation: Secondary | ICD-10-CM

## 2011-01-22 LAB — CONVERTED CEMR LAB: POC INR: 2

## 2011-01-24 ENCOUNTER — Encounter: Payer: Self-pay | Admitting: Cardiology

## 2011-01-27 ENCOUNTER — Other Ambulatory Visit: Payer: Self-pay | Admitting: *Deleted

## 2011-01-27 DIAGNOSIS — I5022 Chronic systolic (congestive) heart failure: Secondary | ICD-10-CM

## 2011-01-27 MED ORDER — CARVEDILOL 3.125 MG PO TABS: 3.1250 mg | ORAL_TABLET | Freq: Two times a day (BID) | ORAL | Status: DC

## 2011-01-27 NOTE — Medication Information (Signed)
Summary: rov/tm  Anticoagulant Therapy  Managed by: Bethena Midget, RN, BSN Referring MD: Dr Shirlee Latch PCP: Dr. Kathrynn Running Supervising MD: Johney Frame MD, Fayrene Fearing Indication 1: Atrial Fibrillation Lab Used: [facility] [place] Site: [address] INR POC 2.0 INR RANGE 2.0-3.0  Dietary changes: no    Health status changes: no    Bleeding/hemorrhagic complications: no    Recent/future hospitalizations: no    Any changes in medication regimen? yes       Details: Lisinopril increased from 2.5mg s to 5mg  in AM  and 2.5mg s in PM   Recent/future dental: no  Any missed doses?: no       Is patient compliant with meds? yes       Allergies: No Known Drug Allergies  Anticoagulation Management History:      The patient is taking warfarin and comes in today for a routine follow up visit.  Negative risk factors for bleeding include an age less than 13 years old.  The bleeding index is 'low risk'.  Positive CHADS2 values include History of CHF.  Negative CHADS2 values include Age > 69 years old.  Anticoagulation responsible provider: Kelce Bouton MD, Fayrene Fearing.  INR POC: 2.0.  Cuvette Lot#: [id number].  Exp: 01/2011.    Anticoagulation Management Assessment/Plan:      The patient's current anticoagulation dose is Warfarin sodium 7.5 mg tabs: Use as directed by Anticoagulation Clinic.  The target INR is 2.0-3.0.  The next INR is due 02/05/2011.  Anticoagulation instructions were given to patient.  Results were reviewed/authorized by Bethena Midget, RN, BSN.  She was notified by Bethena Midget, RN, BSN.         Prior Anticoagulation Instructions: INR 1.8 Change dose to 1 tablet everyday except 1.5 tablets on Mondays and Fridays. Recheck in one week.   Current Anticoagulation Instructions: INR 2.0 Today take 1.5 pills then resume 1 pill everyday except 1.5 pills on Mondays and Fridays. Recheck in 2 weeks.

## 2011-01-27 NOTE — Assessment & Plan Note (Signed)
Summary: per Dr Shirlee Latch   Primary Provider:  Dr. Kathrynn Running  CC:  follow up. Pt having back and foot pain.  Maria Arnold  History of Present Illness: 65 yo with recent hospital admission for atrial fibrillation and CHF presents for followup.  Patient was admitted in 2/12.  For about a week prior to admission, she had had congestion and shortness of breath.  She thought she had a bad cold.  She was seen by her PCP and ECG showed atrial fibrillation with RVR.  She was admitted to [facility] for evaluation.  CXR did not show PNA.  She had wheezing consistent with a prior asthma diagnosis.  She was then noted by echo to have EF 30-35% with peri-apical severe hypokinesis.  LHC was done, there was no angiographic coronary disease.  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  She had suffered an emotional shock about a week prior to admission when her daughter-in-law had a miscarriage.    She underwent TEE-guided cardioversion, but this failed to convert her to NSR.  She was then started on Tikosyn, which did convert her to NSR but her QTc prolonged in the setting of using moxifloxacin for acute bronchitis treatment.  Moxifloxacin was stopped and the dofetilide dose was cut back to 250 micrograms two times a day.  At this dose, QTc was normal.  She was also diuresed for mild CHF.    At initial followup appointment, she reported episodes of tachypalpitations consistent with bursts of atrial fibrillation, and she was actually in atrial fibrillation when she initially arrived for office visit, going back into NSR by the end of the appointment.    Since the last time I saw her, she has had no further tachypalpitations.  She is in sinus rhythm today.  She has been feeling quite well with no exertional dyspnea.  She was unable to tolerate increasing her Coreg dose (lightheadedness).  She continues to have problems with significant foot pain from a bone spur and plans to  have a foot operation under local anesthesia.    ECG: NSR at rate 48, anterolateral T wave flattening, QTc 380 msec  Labs (2/12): K 4.8, creatinine 0.67 Labs (3/12): K 4.2, creatinine 0.5  Current Medications (verified): 1)  Coreg 3.125 Mg Tabs (Carvedilol) .... One Twice A Day-Pt Could Not Take The 6.25 2)  Tikosyn 250 Mcg Caps (Dofetilide) .... Take One Capsule By Mouth Twice A Day 3)  Lisinopril 2.5 Mg Tabs (Lisinopril) .... Take One Tablet By Mouth Two Times A Day 4)  Warfarin Sodium 7.5 Mg Tabs (Warfarin Sodium) .... Use As Directed By Anticoagulation Clinic 5)  Multivitamins  Tabs (Multiple Vitamin) .... Take 1 Tablet By Mouth Once A Day 6)  Vit B .... Daily 7)  Vit C .... Daily 8)  Advil 200 Mg Tabs (Ibuprofen) .... Use As Needed For Pain  Allergies (verified): No Known Drug Allergies  Past History:  Past Medical History: 1. Paroxysmal atrial fibrillation with rapid ventricular response.  She is on coumadin and dofetilide. She has had some breakthrough atrial fib on dofetilide.  2. Cardiomyopathy: Nonischemic.  LHC (2/12): No coronary disease, EF 30-35% with periapical severe hypokinesis.  Echo (2/12): EF 30-35%, periapical severe hypokinesis, normal RV, mild to moderate MR.  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  3. Asthma 4. HTN 5. Foot pain from bone spur 6. Low back pain  Family History: Reviewed history  from 01/09/2011 and no changes required. No premature CAD  Social History: Reviewed history from 01/09/2011 and no changes required. Lives in [place] with husband.  2 children, 3 grandchildren.  Nonsmoker, occasional ETOH.   Review of Systems       All systems reviewed and negative except as per HPI.   Vital Signs:  Patient profile:   65 year old female Height:      62 inches Weight:      143 pounds BMI:     26.25 Pulse rate:   78 / minute Pulse rhythm:   regular BP  sitting:   146 / 76  (left arm) Cuff size:   regular  Vitals Entered By: Judithe Modest CMA (January 19, 2011 12:38 PM)  Physical Exam  General:  Well developed, well nourished, in no acute distress. Neck:  Neck supple, no JVD. No masses, thyromegaly or abnormal cervical nodes. Lungs:  Clear bilaterally to auscultation and percussion. Heart:  Non-displaced PMI, chest non-tender; regular rate and rhythm, S1, S2 without murmurs, rubs or gallops. Carotid upstroke normal, no bruit.  Pedals normal pulses. No edema, no varicosities. Abdomen:  Bowel sounds positive; abdomen soft and non-tender without masses, organomegaly, or hernias noted. No hepatosplenomegaly. Extremities:  No clubbing or cyanosis. Neurologic:  Alert and oriented x 3. Psych:  Normal affect.   Impression & Recommendations:  Problem # 1:  SYSTOLIC HEART FAILURE, CHRONIC (ICD-428.22) EF 30-35% with peri-apical severe hypokinesis.  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  No coronary disease on cath.  She has minimal symptoms today (NYHA class I-II).  Unable to uptitrate Coreg, possibly due to bradycardia.  Continue current dose of Coreg.  Will increase lisinopril to 5 mg qam, 2.5 mg qpm.  Echo in 2 more months to assess for recovery of LV function.   Problem # 2:  ATRIAL FIBRILLATION (ICD-427.31) No further tachypalpitations.  She is in NSR today with normal QTc.  She has had documented breakthrough on dofetilide, but I do not think that these episodes are long.  I think that it is imperative to keep her in NSR, as her LV dysfunction could be due to tachycardia-mediated cardiomyopathy.   - I am going to have her see Dr. Johney Frame for atrial fibrillation ablation evaluation.  I think she would be a good candidate.  - We could increase her dofetilide back to 500 two times a day as I think that the prolongation of her QTc in the hospital initially was due to moxifloxacin  interaction with dofetilide.  However, I would not do this without another hospital admission and she does not want to do this.  - Continue coumadin for now.  She has a very painful bone spur on her foot and needs surgery (will be done under local anesthesia).  She cannot hold coumadin until at least 1 month post cardioversion.  At that point, she can hold coumadin for surgery.  After surgery, I would like her to restart anticoagulation with Pradaxa.  I will arrange this through our coumadin clinic.   Other Orders: Echocardiogram (Echo) EP Referral (Cardiology EP Ref )  Patient Instructions: 1)  Your physician has recommended you make the following change in your medication:  2)  Increase Lisinopril to 5mg  in the morning and 2.5mg  in the evening. This will be two 2.5mg  tablets in the morning and one 2.5mg  tablet in the evening. 3)  You have been referred to Dr Johney Frame to discuss an atrial fibrillation ablation.  ASAP 4)  Your physician recommends that you have  lab when you see Dr Allred---BMP 427.31 5)  Your physician recommends that you schedule a follow-up appointment in: 1 month with Dr Shirlee Latch. 6)  Your physician has requested that you have an echocardiogram.  Echocardiography is a painless test that uses sound waves to create images of your heart. It provides your doctor with information about the size and shape of your heart and how well your heart's chambers and valves are working.  This procedure takes approximately one hour. There are no restrictions for this procedure. IN 2 MONTHS Prescriptions: LISINOPRIL 2.5 MG TABS (LISINOPRIL) take two tablets in the morning and take one tablet in the evening  #45 x 6   Entered by:   Katina Dung, RN, BSN   Authorized by:   Marca Ancona, MD   Signed by:   Katina Dung, RN, BSN on 01/19/2011   Method used:   [id number]       [address]       [place], [place]  [address]        Ph: [phone] or [phone]       Fax: [phone]   RxID:   [id number] LISINOPRIL 5 MG TABS (LISINOPRIL) take one in the morning and one-half in the evening  #45 x 6   Entered by:   Katina Dung, RN, BSN   Authorized by:   Marca Ancona, MD   Signed by:   Katina Dung, RN, BSN on 01/19/2011   Method used:   [facility] to        [id number]       [address]       [place], [place]  [address]       Ph: [phone] or [phone]       Fax: [phone]   RxID:   [id number]

## 2011-02-05 ENCOUNTER — Ambulatory Visit (INDEPENDENT_AMBULATORY_CARE_PROVIDER_SITE_OTHER): Payer: 59 | Admitting: *Deleted

## 2011-02-05 ENCOUNTER — Other Ambulatory Visit (INDEPENDENT_AMBULATORY_CARE_PROVIDER_SITE_OTHER): Payer: 59 | Admitting: *Deleted

## 2011-02-05 ENCOUNTER — Ambulatory Visit (INDEPENDENT_AMBULATORY_CARE_PROVIDER_SITE_OTHER): Payer: 59 | Admitting: Internal Medicine

## 2011-02-05 ENCOUNTER — Encounter: Payer: Self-pay | Admitting: Internal Medicine

## 2011-02-05 VITALS — BP 130/80 | HR 59 | Ht 62.0 in | Wt 141.0 lb

## 2011-02-05 DIAGNOSIS — I5022 Chronic systolic (congestive) heart failure: Secondary | ICD-10-CM

## 2011-02-05 DIAGNOSIS — I4891 Unspecified atrial fibrillation: Secondary | ICD-10-CM

## 2011-02-05 DIAGNOSIS — Z7901 Long term (current) use of anticoagulants: Secondary | ICD-10-CM | POA: Insufficient documentation

## 2011-02-05 LAB — BASIC METABOLIC PANEL
BUN: 15 mg/dL (ref 6–23)
CO2: 31 mEq/L (ref 19–32)
Calcium: 9.1 mg/dL (ref 8.4–10.5)
Chloride: 105 mEq/L (ref 96–112)
Creatinine, Ser: 0.5 mg/dL (ref 0.4–1.2)
GFR: 179.91 mL/min (ref >60.00)
Glucose, Bld: 92 mg/dL (ref 70–99)
Potassium: 4.4 mEq/L (ref 3.5–5.1)
Sodium: 142 mEq/L (ref 135–145)

## 2011-02-05 LAB — POCT INR: INR: 2.3

## 2011-02-05 NOTE — Patient Instructions (Signed)
Your physician recommends that you schedule a follow-up appointment in: 8 weeks with Dr Johney Frame   Your physician has recommended that you wear an event monitor. Event monitors are medical devices that record the heart's electrical activity. Doctors most often [place] these monitors to diagnose arrhythmias. Arrhythmias are problems with the speed or rhythm of the heartbeat. The monitor is a small, portable device. You can wear one while you do your normal daily activities. This is usually used to diagnose what is causing palpitations/syncope (passing out). -- 427.31---Patient wants to have this next week along with ECHO that is already scheduled with Dr Shirlee Latch

## 2011-02-05 NOTE — Patient Instructions (Signed)
Continue on same dosage 1 tablet daily except 1.5 tablets on Mondays and Fridays. Recheck 3 weeks.

## 2011-02-06 ENCOUNTER — Encounter: Payer: Self-pay | Admitting: Internal Medicine

## 2011-02-06 NOTE — Assessment & Plan Note (Signed)
The patient was recently diagnosed with CHF and found to have an EF 30%.  This is felt to be possibly due to tachycardia mediated CM or Takotsubo. She presently does not have CHF on exam.  We will repeat echo upon return.

## 2011-02-06 NOTE — Assessment & Plan Note (Signed)
The patient has recently diagnosed persistant afib with breakthrough afib on Tikosyn.  She is appropriately anticoagulated with coumadin.  She does not feel that she has had afib since 01/09/11 and actually wishes to stop tikosyn today.  I suspect that she may be having episodes of asymptomatic afib with RVR.  I think that the most appropriate next step would be to place an event monitor to better characterize the frequency/ nature of her afib.  If she has no afib, then I would recommend that we maintain our current strategy with tikosyn.  If she is having further afib, then we should consider catheter ablation.  Given her cardiomyopathy which is likely tachycardia mediated, we should try to maintain sinus/ strict rate control longterm. I will see her again one the results of her echo and monitor are available.

## 2011-02-06 NOTE — Progress Notes (Signed)
 Maria Arnold is a pleasant 65 yo WF with recently diagnosed atrial fibrillation and nonischemic CM who presents today for EP consultation.  She reports having symptoms of sore throat, cough with congestion, PND, and fevers 2/12.  She was evaluated by urgent care and placed on biaxin.  She reports subsequent palpitations for which she presented to [facility].  She was found to have afib with RVR as well as CHF.  She was noted by echo to have EF 30-35% with peri-apical severe hypokinesis.  LHC was done, there was no angiographic coronary disease.  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  She had suffered an emotional shock about a week prior to admission when her daughter-in-law had a miscarriage.   She underwent TEE-guided cardioversion, but this failed to convert her to NSR.  She was then started on Tikosyn, which did convert her to NSR but her QTc prolonged in the setting of using moxifloxacin for acute bronchitis treatment.  Moxifloxacin was stopped and the dofetilide dose was cut back to 250 micrograms two times a day.  At this dose, QTc was normal.  She was also diuresed for mild CHF.   She has been followed in the outpatient setting by Dr Shirlee Latch since that time.  Upon initial follow-up 01/09/11, she presented with afib but had converted back to sinus prior to the end of the visit.  She reports that she was symptomatic with afib at that time.  She does not feel that she has had any further afib, but reports occasional palpitations with breathlessness lasting only 1-2 seconds.  She is unaware of sustained arrhythmias.  She denies CP, SOB, orthopnea, PND, presyncope, syncope, or other concerns today.   Past Medical History  Diagnosis Date  . PAF (paroxysmal atrial fibrillation)     with rapid ventricular response.  She is on coumadin and dofetilide.   . Cardiomyopathy     Nonischemic.  LHC (2/12): No coronary disease, EF 30-35% with  periapical severe  hypokinesis.  Echo (2/12): EF 30-35%, periapical severe hypokinesis, normal RV, mild to moderate MR.  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   . Asthma   . HTN (hypertension)   . Foot pain   . Back pain    History reviewed. No pertinent past surgical history.  Current outpatient prescriptions:Ascorbic Acid (VITAMIN C) 500 MG tablet, Take 500 mg by mouth daily.  , Disp: , Rfl: ;  Calcium-Magnesium-Zinc (CAL-MAG-ZINC PO), Take by mouth daily.  , Disp: , Rfl: ;  carvedilol (COREG) 3.125 MG tablet, Take 1 tablet (3.125 mg total) by mouth 2 (two) times daily with a meal., Disp: 60 tablet, Rfl: 10;  cefpodoxime (VANTIN) 200 MG tablet, , Disp: , Rfl:  Cyanocobalamin (VITAMIN B-12 CR PO), Take by mouth daily.  , Disp: , Rfl: ;  dofetilide (TIKOSYN) 250 MCG capsule, Take 250 mcg by mouth 2 (two) times daily.  , Disp: , Rfl: ;  Ibuprofen (ADVIL) 200 MG CAPS, as needed.  , Disp: , Rfl: ;  lisinopril (PRINIVIL,ZESTRIL) 2.5 MG tablet, 2.5 mg. 2 tabs po morning and 1 tab po in the afternoon , Disp: , Rfl: ;  Multiple Vitamin (MULTIVITAMIN) capsule, Take 1 capsule by mouth daily.  , Disp: , Rfl:  warfarin (COUMADIN) 7.5 MG tablet, Take by mouth as directed.  , Disp: , Rfl:   No Known Allergies  History   Social History  .  Marital Status: Married    Spouse Name: N/A    Number of Children: N/A  . Years of Education: N/A   Occupational History  . Not on file.   Social History Main Topics  . Smoking status: Former Games developer  . Smokeless tobacco: Not on file  . Alcohol Use: 0.0 oz/week     drinks occasional ETOH, none recently  . Drug Use: No  . Sexually Active: Not on file   Other Topics Concern  . Not on file   Social History Narrative   Lives in [place] with husband.  2 children, 3 grandchildren.  Nonsmoker, occasional ETOH.     History reviewed. No pertinent family history.  ROS-  All  systems are reviewed and are negative except as outlined in the HPI above   Physical Exam: Filed Vitals:   02/05/11 0956  BP: 130/80  Pulse: 59  Height: 5\' 2"  (1.575 m)  Weight: 141 lb (63.957 kg)    GEN- The patient is well appearing, alert and oriented x 3 today.   Head- normocephalic, atraumatic Eyes-  Sclera clear, conjunctiva pink Ears- hearing intact Oropharynx- clear Neck- supple, no JVP Lymph- no cervical lymphadenopathy Lungs- Clear to ausculation bilaterally, normal work of breathing Heart- Regular rate and rhythm, no murmurs, rubs or gallops, PMI not laterally displaced GI- soft, NT, ND, + BS Extremities- no clubbing, cyanosis, or edema MS- no significant deformity or atrophy Skin- no rash or lesion Psych- euthymic mood, full affect Neuro- strength and sensation are intact  EKG- sinus bradycardia 59 bpm, PR 166, QRS 92, Qtc 427, nonspecific ST/T changes

## 2011-02-17 ENCOUNTER — Other Ambulatory Visit (HOSPITAL_COMMUNITY): Payer: 59 | Admitting: Radiology

## 2011-02-24 ENCOUNTER — Other Ambulatory Visit (HOSPITAL_COMMUNITY): Payer: Self-pay | Admitting: Radiology

## 2011-02-24 DIAGNOSIS — I4891 Unspecified atrial fibrillation: Secondary | ICD-10-CM

## 2011-02-25 ENCOUNTER — Other Ambulatory Visit (HOSPITAL_COMMUNITY): Payer: 59 | Admitting: Radiology

## 2011-02-26 ENCOUNTER — Other Ambulatory Visit (HOSPITAL_COMMUNITY): Payer: 59 | Admitting: Radiology

## 2011-02-26 ENCOUNTER — Encounter: Payer: 59 | Admitting: *Deleted

## 2011-03-05 ENCOUNTER — Encounter: Payer: 59 | Admitting: *Deleted

## 2011-03-05 ENCOUNTER — Other Ambulatory Visit (HOSPITAL_COMMUNITY): Payer: 59 | Admitting: Radiology

## 2011-03-13 ENCOUNTER — Other Ambulatory Visit (HOSPITAL_COMMUNITY): Payer: 59 | Admitting: Radiology

## 2011-03-13 ENCOUNTER — Encounter: Payer: 59 | Admitting: *Deleted

## 2011-03-16 ENCOUNTER — Telehealth: Payer: Self-pay | Admitting: Cardiology

## 2011-03-16 ENCOUNTER — Ambulatory Visit (INDEPENDENT_AMBULATORY_CARE_PROVIDER_SITE_OTHER): Payer: 59 | Admitting: *Deleted

## 2011-03-16 DIAGNOSIS — I4891 Unspecified atrial fibrillation: Secondary | ICD-10-CM

## 2011-03-16 LAB — POCT INR: INR: 2.9

## 2011-03-16 NOTE — Telephone Encounter (Signed)
The pt is complaining of a cough.  The pt has a very dry cough that is keeping her awake and relates this side effect to Lisinopril.  The pt's current dose of Lisinopril is 5 mg in the morning and 2.5 mg in the evening.  [facility] [phone] (30 day supply). Pt's cell [phone].  I will forward this message to Dr Shirlee Latch to review and make further recommendations.

## 2011-03-16 NOTE — Telephone Encounter (Signed)
Pt states she having a dry cough due to the lisinopril. Pt would like to talk to a nurse re her med.

## 2011-03-17 MED ORDER — LOSARTAN POTASSIUM 25 MG PO TABS: 25.0000 mg | ORAL_TABLET | Freq: Every day | ORAL | Status: DC

## 2011-03-17 NOTE — Telephone Encounter (Signed)
Stop lisinopril;  Start losartan 25 mg daily.

## 2011-03-17 NOTE — Telephone Encounter (Signed)
Pt had PT yesterday and complained of dry cough.  Has gotten much worse over the last one month.  She thinks it is coming from her medication.  Should she take her morning Lisinipril this am.  Would like pres. Cefpodoxine 200 mg.  Having echo end this week.  Does she need antibodic.  Father is also ill.

## 2011-03-17 NOTE — Telephone Encounter (Signed)
I talked with pt. She will stop Lisinopril and start Losartan 25mg  daily.

## 2011-03-17 NOTE — Telephone Encounter (Signed)
LMTCB-pt's cell #

## 2011-03-18 ENCOUNTER — Telehealth: Payer: Self-pay | Admitting: Cardiology

## 2011-03-18 MED ORDER — CEFPODOXIME PROXETIL 200 MG PO TABS: ORAL_TABLET | ORAL | Status: DC

## 2011-03-18 NOTE — Telephone Encounter (Signed)
I reviewed with Dr Shirlee Latch. He will prescribe Cefpodoxime 200mg  bid for 7 days. Pt is aware.

## 2011-03-18 NOTE — Telephone Encounter (Signed)
Pt wants to talk about b/p medication and it is good but she still has a cough and she is sick and she wants to ask about taking another medication for the cough

## 2011-03-18 NOTE — Telephone Encounter (Signed)
I talked with pt. Pt states she thinks her dry cough is better since changing to Losartan. She also feels her BP is under better control on Losartan. Pt states she feels sick. She has a deeper cough, is coughing up green phlegm, and has a temperature of 99. She is requesting an antibiotic. I will review with Dr Shirlee Latch.

## 2011-03-20 ENCOUNTER — Encounter: Payer: 59 | Admitting: *Deleted

## 2011-03-20 ENCOUNTER — Other Ambulatory Visit (HOSPITAL_COMMUNITY): Payer: 59 | Admitting: Radiology

## 2011-03-25 ENCOUNTER — Ambulatory Visit: Payer: 59 | Admitting: Internal Medicine

## 2011-03-26 ENCOUNTER — Telehealth: Payer: Self-pay | Admitting: Cardiology

## 2011-03-26 MED ORDER — CEFPODOXIME PROXETIL 200 MG PO TABS: 200.0000 mg | ORAL_TABLET | Freq: Two times a day (BID) | ORAL | Status: AC

## 2011-03-26 NOTE — Telephone Encounter (Signed)
Dr Shirlee Latch will prescribe Cefpodoxime 200mg  bid for 7 more days. Pt is aware.

## 2011-03-26 NOTE — Telephone Encounter (Signed)
I talked with pt. Pt states she has finished antibiotic prescribed 03/18/11. Pt states she is feeling much better but feels like she needs a few more days of antibiotic. She states her cough has improved and her bronchitis is better. She feels the infection has moved to her head and she needs a few more days of antibiotic. Over all she is feeling better. I will review with Dr Shirlee Latch.

## 2011-03-26 NOTE — Telephone Encounter (Signed)
Pt wants Maria Arnold to call in more antibiotic for few more days. Pt states she still not feeling well. Pt also has question re her test on Monday. Pt wants know if she able to do the test since she still not feeling well.

## 2011-03-30 ENCOUNTER — Other Ambulatory Visit (HOSPITAL_COMMUNITY): Payer: 59

## 2011-04-08 ENCOUNTER — Telehealth: Payer: Self-pay | Admitting: Cardiology

## 2011-04-08 NOTE — Telephone Encounter (Signed)
Pt wants has been extremely hoarseness and no voice. Pt wants to know if this is due to her meds.

## 2011-04-08 NOTE — Telephone Encounter (Signed)
Per pt wants discuss am medication  losartan . If she doesn't take the losartan this am would she be o.k. Pt is out of town.

## 2011-04-08 NOTE — Telephone Encounter (Signed)
Patient states she is worried because she was taken her medication this morning, and she is not sure if she drop the losartan on the floor or if she took it. RN advice pt. Not to repeat medication dose because she may have taken it. It will be fine to  wait till next dose to take the medication. Patient states also she finished taken antibiotics for  Bronchitis. She is horse again and having drainage in her throat for the last week. She would like to know if it could be the medication side effects:Losartan, Carvedilol or Tikosyn. Pt. Was made aware that hoarseness is not a side effects of these medications. Patient needs to see her PCP for that. Patient verbalized understanding.

## 2011-04-10 ENCOUNTER — Ambulatory Visit (INDEPENDENT_AMBULATORY_CARE_PROVIDER_SITE_OTHER): Payer: 59 | Admitting: *Deleted

## 2011-04-10 DIAGNOSIS — I4891 Unspecified atrial fibrillation: Secondary | ICD-10-CM

## 2011-04-10 LAB — POCT INR: INR: 3.3

## 2011-05-04 ENCOUNTER — Telehealth: Payer: Self-pay | Admitting: Internal Medicine

## 2011-05-04 ENCOUNTER — Ambulatory Visit: Payer: 59 | Admitting: Internal Medicine

## 2011-05-04 NOTE — Telephone Encounter (Signed)
This is a patient of Dr Shirlee Latch and discussed this with Dr Johney Frame do not see a problem with this  lmom for patient

## 2011-05-04 NOTE — Telephone Encounter (Signed)
Pt on carvedilol and has been coughing, read on the internet that people with asthma and/or respiratory problems should not take this med-wants to know if she should be changed  [phone] or [phone]

## 2011-05-07 ENCOUNTER — Encounter: Payer: 59 | Admitting: *Deleted

## 2011-05-08 ENCOUNTER — Telehealth: Payer: Self-pay | Admitting: *Deleted

## 2011-05-08 NOTE — Telephone Encounter (Signed)
Pt needs Tikosyn refilled pharmacy is calling in regards to dose

## 2011-05-12 NOTE — Telephone Encounter (Signed)
Called pharmacy and issue with dose had already been addressed.  Judithe Modest, CMA

## 2011-05-21 ENCOUNTER — Ambulatory Visit (INDEPENDENT_AMBULATORY_CARE_PROVIDER_SITE_OTHER): Payer: 59 | Admitting: *Deleted

## 2011-05-21 ENCOUNTER — Ambulatory Visit (HOSPITAL_COMMUNITY): Payer: 59 | Attending: Internal Medicine

## 2011-05-21 DIAGNOSIS — I079 Rheumatic tricuspid valve disease, unspecified: Secondary | ICD-10-CM | POA: Insufficient documentation

## 2011-05-21 DIAGNOSIS — I4891 Unspecified atrial fibrillation: Secondary | ICD-10-CM

## 2011-05-21 DIAGNOSIS — I428 Other cardiomyopathies: Secondary | ICD-10-CM | POA: Insufficient documentation

## 2011-05-21 LAB — POCT INR: INR: 2.6

## 2011-05-22 ENCOUNTER — Telehealth: Payer: Self-pay | Admitting: Pharmacist

## 2011-05-22 NOTE — Telephone Encounter (Signed)
Copy of clearance faxed to Dr. Tawni Pummel office at [phone] attn: Lynwood Dawley

## 2011-05-22 NOTE — Telephone Encounter (Signed)
Message copied by Velda Shell on Fri May 22, 2011 10:47 AM ------      Message from: Laurey Morale      Created: Thu May 21, 2011 10:42 PM       OK to hold coumadin for procedure.       ----- Message -----         From: Mariane Masters, PHARMD         Sent: 05/21/2011   4:55 PM           To: Marca Ancona, MD            Pt needing to have surgery on L foot spur by Dr. Candis Shine at [place] [phone]).  They need clearance to hold Coumadin prior to scheduling surgery.  Please advise.

## 2011-06-04 ENCOUNTER — Ambulatory Visit: Payer: 59 | Admitting: Cardiology

## 2011-06-05 ENCOUNTER — Telehealth: Payer: Self-pay | Admitting: Internal Medicine

## 2011-06-05 ENCOUNTER — Telehealth: Payer: Self-pay | Admitting: Cardiology

## 2011-06-05 MED ORDER — AMLODIPINE BESYLATE 2.5 MG PO TABS: 2.5000 mg | ORAL_TABLET | Freq: Every day | ORAL | Status: DC

## 2011-06-05 NOTE — Telephone Encounter (Signed)
Patient informed of her echo results

## 2011-06-05 NOTE — Telephone Encounter (Signed)
Pt has questions re medication °

## 2011-06-05 NOTE — Telephone Encounter (Signed)
Pt rtn kelly's call

## 2011-06-05 NOTE — Telephone Encounter (Signed)
Pt. States she is take Losartan 25 mg daily and Carvedilol 3.125 mg two times daily. Pt states the Losartan is not working her B/P runs at 160, 165, and 170/80 in between medications, when she takes the Carvedilol the B/P goes down soon after taken. Pt would like to change the Losartan to some other medication. Pt also states Dr. Shirlee Latch suggested to order another medication in place of  coumadin .

## 2011-06-08 NOTE — Telephone Encounter (Signed)
I talked with pt. Pt has decided to continue Losartan. Her BP has been better. She will continue Losartan and bring log of BP readings to appt with Dr Shirlee Latch 06/17/11.

## 2011-06-08 NOTE — Telephone Encounter (Signed)
I talked with pt. Pt states she has continued Losartan. She states her BP has been better on the Losartan. She will bring BP readings to appt with Dr Shirlee Latch 06/17/11.

## 2011-06-08 NOTE — Telephone Encounter (Signed)
Addended by: Jacqlyn Krauss on: 06/08/2011 01:34 PM   Modules accepted: Orders

## 2011-06-09 ENCOUNTER — Encounter: Payer: Self-pay | Admitting: Cardiology

## 2011-06-16 ENCOUNTER — Ambulatory Visit: Payer: 59 | Admitting: Cardiology

## 2011-06-16 ENCOUNTER — Other Ambulatory Visit: Payer: 59 | Admitting: *Deleted

## 2011-06-18 ENCOUNTER — Ambulatory Visit (INDEPENDENT_AMBULATORY_CARE_PROVIDER_SITE_OTHER): Payer: 59 | Admitting: *Deleted

## 2011-06-18 DIAGNOSIS — I4891 Unspecified atrial fibrillation: Secondary | ICD-10-CM

## 2011-06-18 LAB — POCT INR: INR: 3.7

## 2011-07-02 ENCOUNTER — Encounter: Payer: 59 | Admitting: *Deleted

## 2011-07-02 ENCOUNTER — Other Ambulatory Visit: Payer: Self-pay | Admitting: Pharmacist

## 2011-07-02 MED ORDER — WARFARIN SODIUM 7.5 MG PO TABS: ORAL_TABLET | ORAL | Status: DC

## 2011-07-06 ENCOUNTER — Encounter: Payer: 59 | Admitting: *Deleted

## 2011-07-08 ENCOUNTER — Telehealth: Payer: Self-pay | Admitting: Internal Medicine

## 2011-07-08 DIAGNOSIS — I4891 Unspecified atrial fibrillation: Secondary | ICD-10-CM

## 2011-07-08 MED ORDER — DOFETILIDE 250 MCG PO CAPS: 250.0000 ug | ORAL_CAPSULE | Freq: Two times a day (BID) | ORAL | Status: DC

## 2011-07-08 NOTE — Telephone Encounter (Signed)
Pharmacist states pt needs [facility] if not pt will need to go to the hospital for her dosage.

## 2011-07-08 NOTE — Telephone Encounter (Signed)
Tikosyn  250 MCG one tablet by mouth twice a day [facility] as [facility]. Pharmacist aware.

## 2011-07-14 ENCOUNTER — Ambulatory Visit: Payer: 59 | Admitting: Cardiology

## 2011-07-14 ENCOUNTER — Other Ambulatory Visit: Payer: 59 | Admitting: *Deleted

## 2011-07-15 ENCOUNTER — Encounter: Payer: Self-pay | Admitting: Cardiology

## 2011-07-23 ENCOUNTER — Encounter: Payer: 59 | Admitting: *Deleted

## 2011-07-23 ENCOUNTER — Ambulatory Visit (INDEPENDENT_AMBULATORY_CARE_PROVIDER_SITE_OTHER): Payer: 59 | Admitting: *Deleted

## 2011-07-23 DIAGNOSIS — I4891 Unspecified atrial fibrillation: Secondary | ICD-10-CM

## 2011-07-23 LAB — POCT INR: INR: 2.8

## 2011-08-06 ENCOUNTER — Telehealth: Payer: Self-pay | Admitting: Cardiology

## 2011-08-06 NOTE — Telephone Encounter (Signed)
Pt requesting refill of abx that was prescribed back in May by [place], she's getting a new pcp appt not till Nov, [facility] [address] on [address]

## 2011-08-06 NOTE — Telephone Encounter (Signed)
Pt calling back asking about antibx, cefpodoxime (vantin) 200mg . Pt said she took this before and it knocked out her symptoms. Pt would like a RX of this medicine called into pharmacy.   Please return pt call with any questions.

## 2011-08-07 ENCOUNTER — Other Ambulatory Visit: Payer: Self-pay

## 2011-08-07 MED ORDER — CEFPODOXIME PROXETIL 200 MG PO TABS: 200.0000 mg | ORAL_TABLET | Freq: Two times a day (BID) | ORAL | Status: AC

## 2011-08-13 ENCOUNTER — Ambulatory Visit (INDEPENDENT_AMBULATORY_CARE_PROVIDER_SITE_OTHER): Payer: 59 | Admitting: Cardiology

## 2011-08-13 ENCOUNTER — Ambulatory Visit (INDEPENDENT_AMBULATORY_CARE_PROVIDER_SITE_OTHER): Payer: 59 | Admitting: *Deleted

## 2011-08-13 ENCOUNTER — Encounter: Payer: Self-pay | Admitting: Cardiology

## 2011-08-13 VITALS — BP 124/72 | HR 66 | Ht 62.0 in | Wt 157.0 lb

## 2011-08-13 DIAGNOSIS — I4891 Unspecified atrial fibrillation: Secondary | ICD-10-CM

## 2011-08-13 DIAGNOSIS — I5022 Chronic systolic (congestive) heart failure: Secondary | ICD-10-CM

## 2011-08-13 LAB — POCT INR: INR: 3.2

## 2011-08-13 NOTE — Assessment & Plan Note (Signed)
EF initially in 2/12 was 30-35% with peri-apical severe hypokinesis. 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 No coronary disease on cath. She has minimal symptoms today (NYHA class I-II).  Repeat echo in sinus rhythm in 7/12 showed that EF had returned to normal, 55-60%.  I would like her to continue on Coreg and losartan at current doses.  She has an indication for use of these medications even in the absence of her cardiomyopathy (hypertension).

## 2011-08-13 NOTE — Progress Notes (Signed)
65 yo with hospital admission in 2/12 for atrial fibrillation and CHF presents for followup.  For about a week prior to admission, she had had congestion and shortness of breath. She thought she had a bad cold. She was seen by her PCP and ECG showed atrial fibrillation with RVR. She was admitted to [facility] for evaluation. CXR did not show PNA. She had wheezing consistent with a prior asthma diagnosis. She was then noted by echo to have EF 30-35% with peri-apical severe hypokinesis. LHC was done, there was no angiographic coronary disease. 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 She had suffered an emotional shock about a week prior to admission when her daughter-in-law had a miscarriage.   She underwent TEE-guided cardioversion, but this failed to convert her to NSR. She was then started on Tikosyn, which did convert her to NSR but her QTc prolonged in the setting of using moxifloxacin for acute bronchitis treatment. Moxifloxacin was stopped and the dofetilide dose was cut back to 250 micrograms two times a day. At this dose, QTc was normal. She was also diuresed for mild CHF.   At initial followup appointment, she reported episodes of tachypalpitations consistent with bursts of atrial fibrillation, and she was actually in atrial fibrillation when she initially arrived for office visit, going back into NSR by the end of the appointment.   Since the initial outpatient visit, she has had no further tachypalpitations. She is in sinus rhythm today. She has been feeling quite well with no exertional dyspnea.  She continues to have problems with significant foot pain from a bone spur and plans to have a foot operation under local anesthesia.  She has been curtailing exercise due to concerns that the strain could hurt her heart and she has gained weight.  She is eager to stop all medications, especially coumadin and dofetilide.   Repeat echo done in  7/12 showed EF improved to 55-60% with normal RV and valves.  ECG: NSR, LAE, lateral T wave flattening, normal QT interval  Labs (2/12): K 4.8, creatinine 0.67  Labs (3/12): K 4.2, creatinine 0.5   Allergies (verified):  No Known Drug Allergies   Past Medical History:  1. Paroxysmal atrial fibrillation with rapid ventricular response. She is on coumadin and dofetilide. She has had some breakthrough atrial fib on dofetilide.  2. Cardiomyopathy: Nonischemic. LHC (2/12): No coronary disease, EF 30-35% with periapical severe hypokinesis. Echo (2/12): EF 30-35%, periapical severe hypokinesis, normal RV, mild to moderate MR. 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  Repeat echo in 7/12 showed EF 55-60%, mild biatrial enlargement, normal RV, normal valves.  3. Asthma  4. HTN  5. Foot pain from bone spur  6. Low back pain   Family History:  No premature CAD   Social History:  Lives in [place] with husband. 2 children, 3 grandchildren. Nonsmoker, occasional ETOH.   Review of Systems  All systems reviewed and negative except as per HPI.   Current Outpatient Prescriptions  Medication Sig Dispense Refill  . Ascorbic Acid (VITAMIN C) 500 MG tablet Take 500 mg by mouth daily.        . Calcium-Magnesium-Zinc (CAL-MAG-ZINC PO) Take by mouth daily.        . carvedilol (COREG) 3.125 MG tablet Take 1 tablet (3.125 mg total) by mouth 2 (two) times daily with a meal.  60 tablet  10  . cefpodoxime (VANTIN) 200  MG tablet Take 1 tablet (200 mg total) by mouth 2 (two) times daily.  14 tablet  1  . Cyanocobalamin (VITAMIN B-12 CR PO) Take by mouth daily.        Marland Kitchen dofetilide (TIKOSYN) 250 MCG capsule Take 1 capsule (250 mcg total) by mouth 2 (two) times daily.  60 capsule  11  . losartan (COZAAR) 25 MG tablet Take 1 tablet (25 mg total) by mouth daily.      . Multiple Vitamin (MULTIVITAMIN) capsule Take 1 capsule by mouth  daily.        Marland Kitchen warfarin (COUMADIN) 7.5 MG tablet Take as directed by Anticoagulation clinic   40 tablet  2    BP 124/72  Pulse 66  Ht 5\' 2"  (1.575 m)  Wt 157 lb (71.215 kg)  BMI 28.72 kg/m2 General: NAD Neck: No JVD, no thyromegaly or thyroid nodule.  Lungs: Clear to auscultation bilaterally with normal respiratory effort. CV: Nondisplaced PMI.  Heart regular S1/S2, no S3/S4, no murmur.  No peripheral edema.  No carotid bruit.  Normal pedal pulses.  Abdomen: Soft, nontender, no hepatosplenomegaly, no distention.  Neurologic: Alert and oriented x 3.  Psych: Normal affect. Extremities: No clubbing or cyanosis.

## 2011-08-13 NOTE — Patient Instructions (Signed)
Your physician recommends that you schedule a follow-up appointment with Dr Johney Frame next week--any day except Tuesday.  Your physician recommends that you have lab work today--BMP  Your physician wants you to follow-up in: 3 months with Dr Shirlee Latch.(January 2013).You will receive a reminder letter in the mail two months in advance. If you don't receive a letter, please call our office to schedule the follow-up appointment.

## 2011-08-14 LAB — BASIC METABOLIC PANEL
BUN: 23 mg/dL (ref 6–23)
CO2: 29 mEq/L (ref 19–32)
Calcium: 9 mg/dL (ref 8.4–10.5)
Chloride: 103 mEq/L (ref 96–112)
Creatinine, Ser: 0.5 mg/dL (ref 0.4–1.2)
GFR: 145.54 mL/min (ref >60.00)
Glucose, Bld: 97 mg/dL (ref 70–99)
Potassium: 4.1 mEq/L (ref 3.5–5.1)
Sodium: 139 mEq/L (ref 135–145)

## 2011-08-14 NOTE — Assessment & Plan Note (Signed)
It is hard to know if atrial fibrillation was the cause of her cardiomyopathy (tachycardia-mediated) or a consequence of a Takotsubo-type cardiomyopathy.  Regardless, I think that she needs to remain in sinus rhythm.  She did have some breakthrough fibrillation initially on dofetilide but has had no recent tachypalpitations.  We had a long discussion today about dofetilide and warfarin.  She wants to stop both.  Her CHADSVASC score is 24 (age, gender, HTN), so she has an indication for anticoagulation.  I offfered her Xarelto instead of warfarin today, but she says she has gotten used to warfarin and would prefer to stay on it if she has to be on anything.  She is very interested in pursuing atrial fibrillation ablation.  I do think that this would be reasonable as it could potentially allow her to come off of dofetilide safely.  I think that it is imperative to keep her out of atrial fibrillation (given possible tachycardia-mediated cardiomyopathy), either by antiarrhythmic medication or ablation.  She wants to get off the medication, so I think that ablation would be a good option.  I explained to her that even if she has an ablation, I cannot tell her that it is safe to come off anticoagulation.  - I will have her go back to see Dr. Johney Frame to discuss atrial fibrillation ablation.  I think that she could come off dofetilide if this is successfully done.  We will have to continue discussion about anticoagulation.  - BMET and magnesium today (on dofetilide).  QT was ok on ECG.   I told Mrs Dannenberg today that it would be fine for her to exercise.  Her EF is back to normal.  She really has no limitations other than avoiding situations with high risk for trauma (given anticoagulation).

## 2011-08-24 ENCOUNTER — Encounter (HOSPITAL_COMMUNITY): Admission: RE | Payer: Self-pay | Source: Ambulatory Visit

## 2011-08-24 ENCOUNTER — Encounter (HOSPITAL_COMMUNITY): Payer: Self-pay

## 2011-08-24 ENCOUNTER — Ambulatory Visit (HOSPITAL_COMMUNITY): Admit: 2011-08-24 | Payer: Self-pay | Admitting: Internal Medicine

## 2011-08-24 ENCOUNTER — Encounter: Payer: Self-pay | Admitting: Internal Medicine

## 2011-08-24 ENCOUNTER — Encounter: Payer: Self-pay | Admitting: *Deleted

## 2011-08-24 ENCOUNTER — Ambulatory Visit (INDEPENDENT_AMBULATORY_CARE_PROVIDER_SITE_OTHER): Payer: 59 | Admitting: Internal Medicine

## 2011-08-24 DIAGNOSIS — I5022 Chronic systolic (congestive) heart failure: Secondary | ICD-10-CM

## 2011-08-24 DIAGNOSIS — I10 Essential (primary) hypertension: Secondary | ICD-10-CM

## 2011-08-24 DIAGNOSIS — I4891 Unspecified atrial fibrillation: Secondary | ICD-10-CM

## 2011-08-24 SURGERY — ATRIAL FIBRILLATION ABLATION
Anesthesia: Choice

## 2011-08-24 MED ORDER — LOSARTAN POTASSIUM 25 MG PO TABS: 50.0000 mg | ORAL_TABLET | Freq: Every day | ORAL | Status: DC

## 2011-08-24 NOTE — Assessment & Plan Note (Signed)
She finds that her BP is frequently elevated Increase losartan to 50mg  daily

## 2011-08-24 NOTE — Progress Notes (Signed)
 Maria Arnold is a pleasant 65 yo WF with paroxysmal atrial fibrillation and nonischemic CM who presents today for EP follow-up.  She reports having symptoms of sore throat, cough with congestion, PND, and fevers 2/12.  She was evaluated by urgent care and placed on biaxin.  She reports subsequent palpitations for which she presented to [facility].  She was found to have afib with RVR as well as CHF.  She was noted by echo to have EF 30-35% with peri-apical severe hypokinesis.  LHC was done, there was no angiographic coronary disease.  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  She underwent TEE-guided cardioversion, but this failed to convert her to NSR.  She was then started on Tikosyn, which did convert her to NSR but her QTc prolonged in the setting of using moxifloxacin for acute bronchitis treatment.  Moxifloxacin was stopped and the dofetilide dose was cut back to 250 micrograms two times a day.  At this dose, QTc was normal.  She was also diuresed for mild CHF.   She has been followed in the outpatient setting by Dr Shirlee Latch since that time.  She continues to have intermittent episodes of atrial fibrillation despite tikosyn.  She finds that these episodes occur after eating or when anxious.   She denies CP, SOB, orthopnea, PND, presyncope, syncope, or other concerns today.   Past Medical History  Diagnosis Date  . Cardiomyopathy     Nonischemic.  LHC (2/12): No coronary disease, EF 30-35% with periapical severe  hypokinesis.  Echo (2/12): EF 30-35%, periapical severe hypokinesis, normal RV, mild to moderate MR.  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   . Asthma   . HTN (hypertension)   . Foot pain     From bone spur  . Back pain     Low back  . PAF (paroxysmal atrial fibrillation)     with rapid ventricular response.  She is on coumadin and  dofetilide.  She has had some breakthrough atrial fib on dofetilide.   No past surgical history on file.  Current outpatient prescriptions:Ascorbic Acid (VITAMIN C) 500 MG tablet, Take 500 mg by mouth daily.  , Disp: , Rfl: ;  Calcium-Magnesium-Zinc (CAL-MAG-ZINC PO), Take by mouth daily.  , Disp: , Rfl: ;  carvedilol (COREG) 3.125 MG tablet, Take 1 tablet (3.125 mg total) by mouth 2 (two) times daily with a meal., Disp: 60 tablet, Rfl: 10;  Cyanocobalamin (VITAMIN B-12 CR PO), Take by mouth daily.  , Disp: , Rfl:  diazepam (VALIUM) 5 MG tablet, as needed., Disp: , Rfl: ;  dofetilide (TIKOSYN) 250 MCG capsule, Take 1 capsule (250 mcg total) by mouth 2 (two) times daily., Disp: 60 capsule, Rfl: 11;  losartan (COZAAR) 25 MG tablet, Take 2 tablets (50 mg total) by mouth daily., Disp: 60 tablet, Rfl: 11;  Multiple Vitamin (MULTIVITAMIN) capsule, Take 1 capsule by mouth daily.  , Disp: , Rfl:  warfarin (COUMADIN) 7.5 MG tablet, Take as directed by Anticoagulation clinic , Disp: 40 tablet, Rfl: 2  Allergies  Allergen Reactions  . Lisinopril Cough    History   Social History  . Marital Status: Married    Spouse Name: N/A    Number of Children: 2  . Years of Education: N/A   Occupational History  .  Other   Social History Main Topics  . Smoking status: Former Games developer  . Smokeless tobacco:  Not on file  . Alcohol Use: 0.0 oz/week     drinks occasional ETOH, none recently  . Drug Use: No  . Sexually Active: Not on file   Other Topics Concern  . Not on file   Social History Narrative   Lives in [place] with husband.  2 children, 3 grandchildren.  Nonsmoker, occasional ETOH.     Family History  Problem Relation Age of Onset  . Coronary artery disease Neg Hx     Premature    ROS-  All systems are reviewed and are negative except as outlined in the HPI above   Physical Exam: Filed Vitals:   08/24/11 1205  BP: 144/81  Pulse: 58  Height: 5\' 2"  (1.575 m)  Weight: 155 lb (70.308  kg)    GEN- The patient is well appearing, alert and oriented x 3 today.   Head- normocephalic, atraumatic Eyes-  Sclera clear, conjunctiva pink Ears- hearing intact Oropharynx- clear Neck- supple, no JVP Lymph- no cervical lymphadenopathy Lungs- Clear to ausculation bilaterally, normal work of breathing Heart- Regular rate and rhythm, no murmurs, rubs or gallops, PMI not laterally displaced GI- soft, NT, ND, + BS Extremities- no clubbing, cyanosis, or edema MS- no significant deformity or atrophy Skin- no rash or lesion Psych- anxious mood, full affect Neuro- strength and sensation are intact

## 2011-08-24 NOTE — Assessment & Plan Note (Signed)
She continues to have intermittent episodes of afib despite tikosyn.  She also reports being "scared" of using tikosyn long term. Therapeutic strategies for afib including medicine and ablation were discussed in detail with the patient today. Risk, benefits, and alternatives to EP study and radiofrequency ablation for afib were also discussed in detail today. These risks include but are not limited to stroke, bleeding, vascular damage, tamponade, perforation, damage to the esophagus, lungs, and other structures, pulmonary vein stenosis, worsening renal function, and death. The patient understands these risk and wishes to proceed.  We will therefore proceed with catheter ablation at the next available time.

## 2011-08-24 NOTE — Patient Instructions (Addendum)
Your physician recommends that you return for lab work on Monday, November 19th in anticipation of procedure.  Your physician has recommended that you have an ablation. Catheter ablation is a medical procedure used to treat some cardiac arrhythmias (irregular heartbeats). During catheter ablation, a long, thin, flexible tube is put into a blood vessel in your groin (upper thigh), or neck. This tube is called an ablation catheter. It is then guided to your heart through the blood vessel. Radio frequency waves destroy small areas of heart tissue where abnormal heartbeats may cause an arrhythmia to start. Please see the instruction sheet given to you today.  Your physician has recommended you make the following change in your medication: Increase Losartan to 50mg  daily.

## 2011-08-24 NOTE — Assessment & Plan Note (Signed)
Increase losartan as above She will continue to follow closely with Dr Shirlee Latch.

## 2011-09-03 ENCOUNTER — Encounter: Payer: 59 | Admitting: *Deleted

## 2011-09-22 ENCOUNTER — Encounter (HOSPITAL_COMMUNITY): Payer: Self-pay | Admitting: Pharmacy Technician

## 2011-09-25 ENCOUNTER — Telehealth: Payer: Self-pay | Admitting: Internal Medicine

## 2011-09-25 ENCOUNTER — Ambulatory Visit: Admit: 2011-09-25 | Payer: Self-pay | Admitting: Internal Medicine

## 2011-09-25 DIAGNOSIS — I5022 Chronic systolic (congestive) heart failure: Secondary | ICD-10-CM

## 2011-09-25 DIAGNOSIS — I4891 Unspecified atrial fibrillation: Secondary | ICD-10-CM

## 2011-09-25 SURGERY — ABLATION, ARRHYTHMOGENIC FOCUS, FOR ATRIAL FLUTTER
Anesthesia: General

## 2011-09-25 NOTE — Telephone Encounter (Addendum)
Pt needs to rs cath ablation and lab work, pls call her father is critically ill

## 2011-09-25 NOTE — Telephone Encounter (Signed)
Spoke with patient and let her know if she canceled it will be Jan 2013 before we can reschedule  She verbalized the understanding

## 2011-09-28 ENCOUNTER — Other Ambulatory Visit: Payer: Self-pay | Admitting: Nurse Practitioner

## 2011-09-29 ENCOUNTER — Telehealth: Payer: Self-pay | Admitting: Internal Medicine

## 2011-09-29 NOTE — Telephone Encounter (Signed)
Pt returning your call she wanted to reschedule her ablation she has a terriable cold

## 2011-09-29 NOTE — Telephone Encounter (Signed)
lmom for patient to call me back regarding her procedure

## 2011-09-29 NOTE — Telephone Encounter (Signed)
Spoke with patient and let her know she really needed to proceed with the procedure  She still wants to cancel and i will do so  However I have asked that she call Dr Shirlee Latch first

## 2011-09-29 NOTE — Telephone Encounter (Signed)
Pt calling re ablation next week, needs to rs, post op is tomorrow

## 2011-09-30 ENCOUNTER — Other Ambulatory Visit: Payer: 59 | Admitting: *Deleted

## 2011-09-30 NOTE — Telephone Encounter (Signed)
Canceled patient and she can call back to reschedule  Let her know it will be Jan before she can reschedule

## 2011-10-04 MED ORDER — SODIUM CHLORIDE 0.9 % IV SOLN: INTRAVENOUS | Status: DC

## 2011-10-05 ENCOUNTER — Ambulatory Visit (HOSPITAL_COMMUNITY): Admission: RE | Admit: 2011-10-05 | Payer: 59 | Source: Ambulatory Visit | Admitting: Cardiovascular Disease

## 2011-10-05 ENCOUNTER — Telehealth: Payer: Self-pay | Admitting: Internal Medicine

## 2011-10-05 ENCOUNTER — Encounter (HOSPITAL_COMMUNITY): Admission: RE | Payer: Self-pay | Source: Ambulatory Visit

## 2011-10-05 SURGERY — ECHOCARDIOGRAM, TRANSESOPHAGEAL
Anesthesia: Moderate Sedation

## 2011-10-05 NOTE — Telephone Encounter (Signed)
Pt said hospital called to find out why she didn't show up for her procedure, she says it was to be cancelled, she told them that and wants to rs to the third week of January

## 2011-10-05 NOTE — Telephone Encounter (Signed)
Will look 12/01/11 for ablation and 11/30/11 for TEE and have lab work done on 11/24/11

## 2011-10-06 ENCOUNTER — Ambulatory Visit (HOSPITAL_COMMUNITY): Admission: RE | Admit: 2011-10-06 | Payer: 59 | Source: Ambulatory Visit | Admitting: Internal Medicine

## 2011-10-06 ENCOUNTER — Encounter (HOSPITAL_COMMUNITY): Admission: RE | Payer: Self-pay | Source: Ambulatory Visit

## 2011-10-06 ENCOUNTER — Encounter: Payer: Self-pay | Admitting: *Deleted

## 2011-10-06 SURGERY — ATRIAL FIBRILLATION ABLATION
Anesthesia: General

## 2011-10-06 SURGERY — ABLATION, ARRHYTHMOGENIC FOCUS, FOR ATRIAL FLUTTER
Anesthesia: General

## 2011-10-06 NOTE — Telephone Encounter (Signed)
afib ablation 12/01/11 TEE with Dr Gala Romney on 11/30/11 at 2pm Labs 11/24/11--at 11:00am

## 2011-10-29 ENCOUNTER — Encounter: Payer: 59 | Admitting: *Deleted

## 2011-10-29 ENCOUNTER — Telehealth: Payer: Self-pay | Admitting: Cardiology

## 2011-10-29 ENCOUNTER — Ambulatory Visit (INDEPENDENT_AMBULATORY_CARE_PROVIDER_SITE_OTHER): Payer: 59 | Admitting: *Deleted

## 2011-10-29 DIAGNOSIS — I4891 Unspecified atrial fibrillation: Secondary | ICD-10-CM

## 2011-10-29 DIAGNOSIS — Z7901 Long term (current) use of anticoagulants: Secondary | ICD-10-CM

## 2011-10-29 LAB — POCT INR: INR: 2.2

## 2011-10-29 MED ORDER — CEFPODOXIME PROXETIL 200 MG PO TABS: 200.0000 mg | ORAL_TABLET | Freq: Two times a day (BID) | ORAL | Status: AC

## 2011-10-29 NOTE — Telephone Encounter (Signed)
Talked with pt. She will let [facility] know at her appt this afternoon that Dr Shirlee Latch has given her Vantin 200mg  bid x 14 days

## 2011-10-29 NOTE — Telephone Encounter (Signed)
Per Dr Zara Council 200mg  bid for 14 days. LMTCB for pt.

## 2011-10-29 NOTE — Telephone Encounter (Addendum)
[phone], pt requesting rx for abx for a cold, cell [phone]

## 2011-10-30 ENCOUNTER — Telehealth: Payer: Self-pay | Admitting: Cardiology

## 2011-10-30 NOTE — Telephone Encounter (Signed)
New msg Pt is taking antibiotic fof cold and she wants to know if she can take some advil please call

## 2011-10-30 NOTE — Telephone Encounter (Signed)
Spoke with pt encouraged her to use Tylenol for her fever but if she has to use the Advil please ensure that she takes with food, use sparingly. The aspirin content of the Advil can cause over time irritate the GI mucosa. Pt verbalized understanding.

## 2011-11-13 ENCOUNTER — Telehealth: Payer: Self-pay | Admitting: Cardiology

## 2011-11-13 MED ORDER — CEFPODOXIME PROXETIL 200 MG PO TABS: ORAL_TABLET | ORAL | Status: DC

## 2011-11-13 NOTE — Telephone Encounter (Signed)
PT   STILL  C/O COUGHING HUSBAND HAS BRONCHITIS, DEATH IN  FAMILY , AND  LITTLE ONES  HAVE BEEN AROUND .  WOULD LIKE TO  HAVE ANTIBIOTIC THERAPY  EXTENDED  NEED TO  GET BETTER PRIOR TO ABLATION .OKAY TO FILL AS DIRECTED PREVIOUS PER LORI GEHRHARDT NP  NEEDS COUMADIN CLINIC APPT ON MON PT  TRANSFERRED TO COUMADIN CLINIC TO GET APPT TIME./CY

## 2011-11-13 NOTE — Telephone Encounter (Signed)
Pt is to have an ablation done and she got sick and Dr. Shirlee Latch gave her some medication and she is not well yet and was wondering if she could get more medication

## 2011-11-24 ENCOUNTER — Other Ambulatory Visit: Payer: 59 | Admitting: *Deleted

## 2011-11-25 ENCOUNTER — Ambulatory Visit (INDEPENDENT_AMBULATORY_CARE_PROVIDER_SITE_OTHER): Payer: BC Managed Care – PPO | Admitting: *Deleted

## 2011-11-25 DIAGNOSIS — I4891 Unspecified atrial fibrillation: Secondary | ICD-10-CM

## 2011-11-25 DIAGNOSIS — Z7901 Long term (current) use of anticoagulants: Secondary | ICD-10-CM

## 2011-11-25 LAB — POCT INR: INR: 2.5

## 2011-11-26 ENCOUNTER — Telehealth: Payer: Self-pay | Admitting: Internal Medicine

## 2011-11-26 ENCOUNTER — Encounter: Payer: 59 | Admitting: *Deleted

## 2011-11-26 NOTE — Telephone Encounter (Signed)
Walk in pt Form " Pt Needs to Discuss Future Procedure" sent to [facility] 11/26/11/KM

## 2011-11-30 ENCOUNTER — Ambulatory Visit (HOSPITAL_COMMUNITY)
Admission: RE | Admit: 2011-11-30 | Payer: BC Managed Care – PPO | Source: Ambulatory Visit | Admitting: Internal Medicine

## 2011-11-30 ENCOUNTER — Other Ambulatory Visit: Payer: Self-pay | Admitting: Cardiology

## 2011-11-30 ENCOUNTER — Other Ambulatory Visit: Payer: Self-pay | Admitting: *Deleted

## 2011-11-30 DIAGNOSIS — I4891 Unspecified atrial fibrillation: Secondary | ICD-10-CM

## 2011-11-30 SURGERY — ECHOCARDIOGRAM, TRANSESOPHAGEAL
Anesthesia: Moderate Sedation

## 2011-12-01 ENCOUNTER — Encounter (HOSPITAL_COMMUNITY): Payer: Self-pay

## 2011-12-01 ENCOUNTER — Ambulatory Visit (HOSPITAL_COMMUNITY): Admit: 2011-12-01 | Payer: Self-pay | Admitting: Internal Medicine

## 2011-12-01 SURGERY — ATRIAL FIBRILLATION ABLATION
Anesthesia: General

## 2011-12-02 ENCOUNTER — Ambulatory Visit (INDEPENDENT_AMBULATORY_CARE_PROVIDER_SITE_OTHER): Payer: BC Managed Care – PPO | Admitting: *Deleted

## 2011-12-02 DIAGNOSIS — Z7901 Long term (current) use of anticoagulants: Secondary | ICD-10-CM

## 2011-12-02 DIAGNOSIS — I4891 Unspecified atrial fibrillation: Secondary | ICD-10-CM

## 2011-12-02 LAB — POCT INR: INR: 3.3

## 2011-12-09 ENCOUNTER — Encounter: Payer: BC Managed Care – PPO | Admitting: *Deleted

## 2011-12-11 ENCOUNTER — Ambulatory Visit (INDEPENDENT_AMBULATORY_CARE_PROVIDER_SITE_OTHER): Payer: BC Managed Care – PPO

## 2011-12-11 DIAGNOSIS — I4891 Unspecified atrial fibrillation: Secondary | ICD-10-CM

## 2011-12-11 DIAGNOSIS — Z7901 Long term (current) use of anticoagulants: Secondary | ICD-10-CM

## 2011-12-11 LAB — POCT INR: INR: 2.6

## 2011-12-16 ENCOUNTER — Encounter: Payer: BC Managed Care – PPO | Admitting: *Deleted

## 2011-12-21 ENCOUNTER — Encounter: Payer: BC Managed Care – PPO | Admitting: *Deleted

## 2011-12-22 ENCOUNTER — Other Ambulatory Visit: Payer: Self-pay | Admitting: Cardiology

## 2011-12-30 ENCOUNTER — Telehealth: Payer: Self-pay | Admitting: Internal Medicine

## 2011-12-30 NOTE — Telephone Encounter (Signed)
New Msg: Pharmacy calling wanting to speak with MD regarding needing to stress importance of pt tikosyn. Pharmacy stated that tikosyn needs to be monitored closesly and pt needs to get refills at same pharmacy consistently. Pharmacy wants to know if we can discuss this with pt. Please reutrn pharmacy call to discuss further if necessary.

## 2011-12-30 NOTE — Telephone Encounter (Signed)
This patient has been followed by Dr. Johney Frame and Dr. Shirlee Latch. Will forward to Wade since the patient last saw Dr. Johney Frame.

## 2012-01-01 ENCOUNTER — Ambulatory Visit (INDEPENDENT_AMBULATORY_CARE_PROVIDER_SITE_OTHER): Payer: BC Managed Care – PPO | Admitting: Pharmacist

## 2012-01-01 ENCOUNTER — Other Ambulatory Visit: Payer: Self-pay | Admitting: Internal Medicine

## 2012-01-01 DIAGNOSIS — I4891 Unspecified atrial fibrillation: Secondary | ICD-10-CM

## 2012-01-01 DIAGNOSIS — Z7901 Long term (current) use of anticoagulants: Secondary | ICD-10-CM

## 2012-01-01 LAB — POCT INR: INR: 2.3

## 2012-01-06 NOTE — Telephone Encounter (Signed)
Attempted to contact pt. Mailbox full--unable to leave a message.

## 2012-01-07 NOTE — Telephone Encounter (Signed)
LMTCB

## 2012-01-07 NOTE — Telephone Encounter (Signed)
I talked with pt. Pt states she has had to get Tikosyn  filled at a different pharmacy since she was in and  out of town taking care of her father. Pt states she has not missed any doses of Tikosyn. She understands the importance of taking it on time and not missing any doses.

## 2012-01-11 ENCOUNTER — Telehealth: Payer: Self-pay | Admitting: *Deleted

## 2012-01-11 DIAGNOSIS — I4891 Unspecified atrial fibrillation: Secondary | ICD-10-CM

## 2012-01-11 NOTE — Telephone Encounter (Signed)
Per Dr Shirlee Latch. Pt needs BMET and Magnesium. I talked with pt. Pt does not have any insurance at the current time. She will let me know when she has insurance and I will schedule an appt for BMET and Magnesium. Dr Shirlee Latch aware.

## 2012-02-02 ENCOUNTER — Other Ambulatory Visit: Payer: Self-pay | Admitting: Cardiology

## 2012-02-16 ENCOUNTER — Other Ambulatory Visit: Payer: BC Managed Care – PPO

## 2012-02-17 ENCOUNTER — Ambulatory Visit (INDEPENDENT_AMBULATORY_CARE_PROVIDER_SITE_OTHER): Payer: BC Managed Care – PPO | Admitting: *Deleted

## 2012-02-17 ENCOUNTER — Other Ambulatory Visit (INDEPENDENT_AMBULATORY_CARE_PROVIDER_SITE_OTHER): Payer: BC Managed Care – PPO

## 2012-02-17 DIAGNOSIS — Z7901 Long term (current) use of anticoagulants: Secondary | ICD-10-CM

## 2012-02-17 DIAGNOSIS — I4891 Unspecified atrial fibrillation: Secondary | ICD-10-CM

## 2012-02-17 LAB — POCT INR: INR: 2.9

## 2012-02-18 LAB — BASIC METABOLIC PANEL
BUN: 22 mg/dL (ref 6–23)
CO2: 28 mEq/L (ref 19–32)
Calcium: 8.9 mg/dL (ref 8.4–10.5)
Chloride: 102 mEq/L (ref 96–112)
Creatinine, Ser: 0.6 mg/dL (ref 0.4–1.2)
GFR: 121.63 mL/min (ref >60.00)
Glucose, Bld: 97 mg/dL (ref 70–99)
Potassium: 4.3 mEq/L (ref 3.5–5.1)
Sodium: 137 mEq/L (ref 135–145)

## 2012-02-18 LAB — MAGNESIUM: Magnesium: 2 mg/dL (ref 1.5–2.5)

## 2012-02-26 ENCOUNTER — Telehealth: Payer: Self-pay | Admitting: Cardiology

## 2012-02-26 NOTE — Telephone Encounter (Signed)
Phoned pt and LM that i would inform coumadin clinic  that pt unable to come for appoint--also stated pt should call PCP for antibiotic --nt

## 2012-02-26 NOTE — Telephone Encounter (Signed)
Mailbox full, unable to leave message

## 2012-02-26 NOTE — Telephone Encounter (Signed)
New msg Pt said she has bronchitis and called to cancel coumadin appt today. She wanted to know if she could get an antibiotic. Please let her know

## 2012-02-28 DIAGNOSIS — H1130 Conjunctival hemorrhage, unspecified eye: Secondary | ICD-10-CM | POA: Insufficient documentation

## 2012-03-07 ENCOUNTER — Ambulatory Visit (INDEPENDENT_AMBULATORY_CARE_PROVIDER_SITE_OTHER): Payer: BC Managed Care – PPO | Admitting: Pharmacist

## 2012-03-07 DIAGNOSIS — Z7901 Long term (current) use of anticoagulants: Secondary | ICD-10-CM

## 2012-03-07 DIAGNOSIS — I4891 Unspecified atrial fibrillation: Secondary | ICD-10-CM

## 2012-03-07 LAB — POCT INR: INR: 3.9

## 2012-03-16 ENCOUNTER — Ambulatory Visit (INDEPENDENT_AMBULATORY_CARE_PROVIDER_SITE_OTHER): Payer: BC Managed Care – PPO | Admitting: Pharmacist

## 2012-03-16 DIAGNOSIS — I4891 Unspecified atrial fibrillation: Secondary | ICD-10-CM

## 2012-03-16 DIAGNOSIS — Z7901 Long term (current) use of anticoagulants: Secondary | ICD-10-CM

## 2012-03-16 LAB — POCT INR: INR: 3.4

## 2012-04-26 ENCOUNTER — Other Ambulatory Visit: Payer: Self-pay | Admitting: Cardiology

## 2012-04-28 ENCOUNTER — Other Ambulatory Visit: Payer: Self-pay | Admitting: Cardiology

## 2012-04-28 MED ORDER — CARVEDILOL 3.125 MG PO TABS: 3.1250 mg | ORAL_TABLET | Freq: Two times a day (BID) | ORAL | Status: DC

## 2012-04-29 ENCOUNTER — Ambulatory Visit (INDEPENDENT_AMBULATORY_CARE_PROVIDER_SITE_OTHER): Payer: BC Managed Care – PPO | Admitting: Pharmacist

## 2012-04-29 DIAGNOSIS — I4891 Unspecified atrial fibrillation: Secondary | ICD-10-CM

## 2012-04-29 DIAGNOSIS — Z7901 Long term (current) use of anticoagulants: Secondary | ICD-10-CM

## 2012-04-29 LAB — POCT INR: INR: 3.5

## 2012-05-16 ENCOUNTER — Other Ambulatory Visit: Payer: Self-pay | Admitting: Cardiology

## 2012-05-16 NOTE — Telephone Encounter (Signed)
New Problem:    Patient called in needing a refill of her warfarin (COUMADIN) 7.5 MG tablet [facility] (Phone-[phone]).  Please call back once the orders have been placed.

## 2012-05-17 ENCOUNTER — Other Ambulatory Visit: Payer: Self-pay | Admitting: *Deleted

## 2012-05-17 MED ORDER — WARFARIN SODIUM 7.5 MG PO TABS: 7.5000 mg | ORAL_TABLET | Freq: Every day | ORAL | Status: DC

## 2012-05-17 NOTE — Telephone Encounter (Signed)
F/u   Please return call to patient  Regarding warfarin med refill.  Verified preferred correct.

## 2012-05-17 NOTE — Telephone Encounter (Signed)
Rx called to pharmacy.  LMOM for pt to let her know refill had been done.

## 2012-05-19 ENCOUNTER — Telehealth: Payer: Self-pay | Admitting: *Deleted

## 2012-05-19 NOTE — Telephone Encounter (Signed)
Pt called to report that she did not get her refill of coumadin at  [facility]  But did get it from [facility] and also [place]

## 2012-05-23 ENCOUNTER — Ambulatory Visit (INDEPENDENT_AMBULATORY_CARE_PROVIDER_SITE_OTHER): Payer: Medicare Other | Admitting: *Deleted

## 2012-05-23 DIAGNOSIS — Z7901 Long term (current) use of anticoagulants: Secondary | ICD-10-CM

## 2012-05-23 DIAGNOSIS — I4891 Unspecified atrial fibrillation: Secondary | ICD-10-CM

## 2012-05-23 LAB — POCT INR: INR: 3.2

## 2012-05-27 ENCOUNTER — Ambulatory Visit (INDEPENDENT_AMBULATORY_CARE_PROVIDER_SITE_OTHER): Payer: Medicare Other | Admitting: *Deleted

## 2012-05-27 DIAGNOSIS — Z7901 Long term (current) use of anticoagulants: Secondary | ICD-10-CM

## 2012-05-27 DIAGNOSIS — I4891 Unspecified atrial fibrillation: Secondary | ICD-10-CM

## 2012-05-27 LAB — POCT INR: INR: 3.3

## 2012-06-23 ENCOUNTER — Other Ambulatory Visit: Payer: Self-pay | Admitting: Internal Medicine

## 2012-06-23 ENCOUNTER — Other Ambulatory Visit: Payer: Self-pay | Admitting: *Deleted

## 2012-06-23 DIAGNOSIS — I4891 Unspecified atrial fibrillation: Secondary | ICD-10-CM

## 2012-06-23 MED ORDER — DOFETILIDE 250 MCG PO CAPS: 250.0000 ug | ORAL_CAPSULE | Freq: Two times a day (BID) | ORAL | Status: DC

## 2012-06-23 NOTE — Telephone Encounter (Signed)
Pt called in wanting her Tikosyn called in to the pharmacy. Called Pharmacy and spoke to [employer] and she states [facility] [place] [facility] due to them not having it in stock. Called patient back and she is aware of which [facility] to pick up her T[facility]osyn from.

## 2012-06-24 ENCOUNTER — Ambulatory Visit (INDEPENDENT_AMBULATORY_CARE_PROVIDER_SITE_OTHER): Payer: Medicare Other | Admitting: Pharmacist

## 2012-06-24 DIAGNOSIS — Z7901 Long term (current) use of anticoagulants: Secondary | ICD-10-CM

## 2012-06-24 DIAGNOSIS — I4891 Unspecified atrial fibrillation: Secondary | ICD-10-CM

## 2012-06-24 LAB — POCT INR: INR: 2.2

## 2012-07-01 ENCOUNTER — Ambulatory Visit (INDEPENDENT_AMBULATORY_CARE_PROVIDER_SITE_OTHER): Payer: Medicare Other | Admitting: Pharmacist

## 2012-07-01 DIAGNOSIS — I4891 Unspecified atrial fibrillation: Secondary | ICD-10-CM

## 2012-07-01 DIAGNOSIS — Z7901 Long term (current) use of anticoagulants: Secondary | ICD-10-CM

## 2012-07-01 LAB — POCT INR: INR: 2.5

## 2012-07-08 ENCOUNTER — Ambulatory Visit (INDEPENDENT_AMBULATORY_CARE_PROVIDER_SITE_OTHER): Payer: Medicare Other | Admitting: Pharmacist

## 2012-07-08 DIAGNOSIS — Z7901 Long term (current) use of anticoagulants: Secondary | ICD-10-CM

## 2012-07-08 DIAGNOSIS — I4891 Unspecified atrial fibrillation: Secondary | ICD-10-CM

## 2012-07-08 LAB — POCT INR: INR: 3.7

## 2012-07-15 ENCOUNTER — Ambulatory Visit (INDEPENDENT_AMBULATORY_CARE_PROVIDER_SITE_OTHER): Payer: Medicare Other | Admitting: *Deleted

## 2012-07-15 DIAGNOSIS — I4891 Unspecified atrial fibrillation: Secondary | ICD-10-CM

## 2012-07-15 DIAGNOSIS — Z7901 Long term (current) use of anticoagulants: Secondary | ICD-10-CM

## 2012-07-15 LAB — POCT INR: INR: 2

## 2012-07-22 ENCOUNTER — Other Ambulatory Visit: Payer: Self-pay | Admitting: Cardiology

## 2012-07-28 ENCOUNTER — Ambulatory Visit: Payer: Medicare Other | Admitting: Internal Medicine

## 2012-08-10 ENCOUNTER — Encounter: Payer: Self-pay | Admitting: Internal Medicine

## 2012-08-10 ENCOUNTER — Ambulatory Visit (INDEPENDENT_AMBULATORY_CARE_PROVIDER_SITE_OTHER): Payer: Medicare Other | Admitting: Pharmacist

## 2012-08-10 ENCOUNTER — Ambulatory Visit (INDEPENDENT_AMBULATORY_CARE_PROVIDER_SITE_OTHER): Payer: Medicare Other | Admitting: Internal Medicine

## 2012-08-10 ENCOUNTER — Telehealth: Payer: Self-pay | Admitting: Internal Medicine

## 2012-08-10 VITALS — BP 130/80 | HR 54 | Ht 62.0 in | Wt 159.0 lb

## 2012-08-10 DIAGNOSIS — I10 Essential (primary) hypertension: Secondary | ICD-10-CM

## 2012-08-10 DIAGNOSIS — I4891 Unspecified atrial fibrillation: Secondary | ICD-10-CM

## 2012-08-10 DIAGNOSIS — I5022 Chronic systolic (congestive) heart failure: Secondary | ICD-10-CM

## 2012-08-10 DIAGNOSIS — Z7901 Long term (current) use of anticoagulants: Secondary | ICD-10-CM

## 2012-08-10 LAB — CBC WITH DIFFERENTIAL/PLATELET
Basophils Absolute: 0 10*3/uL (ref 0.0–0.1)
Basophils Relative: 0.5 % (ref 0.0–3.0)
Eosinophils Absolute: 0.1 10*3/uL (ref 0.0–0.7)
Eosinophils Relative: 1.2 % (ref 0.0–5.0)
HCT: 40.7 % (ref 36.0–46.0)
Hemoglobin: 13.7 g/dL (ref 12.0–15.0)
Lymphocytes Relative: 29.9 % (ref 12.0–46.0)
Lymphs Abs: 1.6 10*3/uL (ref 0.7–4.0)
MCHC: 33.8 g/dL (ref 30.0–36.0)
MCV: 99.4 fl (ref 78.0–100.0)
Monocytes Absolute: 0.6 10*3/uL (ref 0.1–1.0)
Monocytes Relative: 10.2 % (ref 3.0–12.0)
Neutro Abs: 3.2 10*3/uL (ref 1.4–7.7)
Neutrophils Relative %: 58.2 % (ref 43.0–77.0)
Platelets: 250 10*3/uL (ref 150.0–400.0)
RBC: 4.09 Mil/uL (ref 3.87–5.11)
RDW: 13.2 % (ref 11.5–14.6)
WBC: 5.5 10*3/uL (ref 4.5–10.5)

## 2012-08-10 LAB — BASIC METABOLIC PANEL
BUN: 14 mg/dL (ref 6–23)
CO2: 29 mEq/L (ref 19–32)
Calcium: 9.2 mg/dL (ref 8.4–10.5)
Chloride: 102 mEq/L (ref 96–112)
Creatinine, Ser: 0.5 mg/dL (ref 0.4–1.2)
GFR: 158.57 mL/min (ref >60.00)
Glucose, Bld: 116 mg/dL — ABNORMAL HIGH (ref 70–99)
Potassium: 3.9 mEq/L (ref 3.5–5.1)
Sodium: 139 mEq/L (ref 135–145)

## 2012-08-10 LAB — MAGNESIUM: Magnesium: 1.9 mg/dL (ref 1.5–2.5)

## 2012-08-10 LAB — POCT INR: INR: 2.8

## 2012-08-10 NOTE — Progress Notes (Signed)
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  She presents today for EP follow-up.  She was last seen by me a year ago and scheduled for ablation.  She reports that her father died and she was unable to keep the scheduled time for the procedure.  She now returns for further consideration of ablation.  She continues to have intermittent episodes of atrial fibrillation despite tikosyn.  She reports fatigue and bradycardia with her medical therapy.   She denies CP, SOB, orthopnea, PND, presyncope, syncope, or other concerns today.   Past Medical History  Diagnosis Date  . Cardiomyopathy     Nonischemic.  LHC (2/12): No coronary disease, EF 30-35% with periapical severe  hypokinesis.  Echo (2/12): EF 30-35%, periapical severe hypokinesis, normal RV, mild to moderate MR.  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   . Asthma   . HTN (hypertension)   . Foot pain     From bone spur  . Back pain     Low back  . PAF (paroxysmal atrial fibrillation)     with rapid ventricular response.  She is on coumadin and dofetilide.  She has had some breakthrough atrial fib on dofetilide.   No past surgical history on file.  Current outpatient prescriptions:Ascorbic Acid (VITAMIN C) 500 MG tablet, Take 500 mg by mouth daily.  , Disp: , Rfl: ;  Calcium-Magnesium-Zinc (CAL-MAG-ZINC PO), Take 1 tablet by mouth daily. , Disp: , Rfl: ;  carvedilol (COREG) 3.125 MG tablet, Take 1 tablet (3.125 mg total) by mouth 2 (two) times daily with a meal., Disp: 60 tablet, Rfl: 3;  cefpodoxime (VANTIN) 200 MG tablet, FOR 14 DAYS ./CY, Disp: 28 tablet, Rfl: 1 Cyanocobalamin (VITAMIN B-12 CR PO), Take 3 tablets by mouth daily. , Disp: , Rfl: ;  diazepam (VALIUM) 5 MG tablet, Take 5 mg by mouth at bedtime as needed. For sleep., Disp: , Rfl: ;  dofetilide (TIKOSYN)  250 MCG capsule, Take 1 capsule (250 mcg total) by mouth 2 (two) times daily., Disp: 60 capsule, Rfl: 11;  losartan (COZAAR) 25 MG tablet, TAKE 1 TABLET EVERY DAY, Disp: 30 tablet, Rfl: 6 Multiple Vitamin (MULTIVITAMIN) capsule, Take 1 capsule by mouth daily.  , Disp: , Rfl: ;  warfarin (COUMADIN) 7.5 MG tablet, Take 1 tablet (7.5 mg total) by mouth daily. Take 1 tablet daily except 1.5 tablets on Mondays and Fridays OR As Directed, Disp: 35 tablet, Rfl: 1;  warfarin (COUMADIN) 7.5 MG tablet, Take as directed by anticoagulation clinic, Disp: 40 tablet, Rfl: 3  Allergies  Allergen Reactions  . Lisinopril Cough    History   Social History  . Marital Status: Married    Spouse Name: N/A    Number of Children: 2  . Years of Education: N/A   Occupational History  .  Other   Social History Main Topics  . Smoking status: Former Games developer  . Smokeless tobacco: Not on file  . Alcohol Use: 0.0 oz/week     drinks occasional ETOH, none recently  . Drug Use: No  . Sexually Active: Not on file   Other Topics Concern  . Not on file   Social History Narrative   Lives in [place] with husband.  2 children, 3 grandchildren.  Nonsmoker, occasional ETOH.     Family History  Problem Relation Age of Onset  . Coronary artery disease  Neg Hx     Premature    ROS-  All systems are reviewed and are negative except as outlined in the HPI above   Physical Exam: Filed Vitals:   08/10/12 1425  BP: 130/80  Pulse: 54  Height: 5\' 2"  (1.575 m)  Weight: 159 lb (72.122 kg)    GEN- The patient is well appearing, alert and oriented x 3 today.   Head- normocephalic, atraumatic Eyes-  Sclera clear, conjunctiva pink Ears- hearing intact Oropharynx- clear Neck- supple, no JVP Lymph- no cervical lymphadenopathy Lungs- Clear to ausculation bilaterally, normal work of breathing Heart- Regular rate and rhythm, no murmurs, rubs or gallops, PMI not laterally displaced GI- soft, NT, ND, + BS Extremities- no  clubbing, cyanosis, or edema   ekg today reveals sinus rhythm 44 bpm, Qtc 338

## 2012-08-10 NOTE — Assessment & Plan Note (Signed)
She continues to have intermittent episodes of afib despite tikosyn.  Given intermittent compliance, she is a poor candidate for tikosyn long term. Therapeutic strategies for afib including medicine and ablation were discussed in detail with the patient today. Risk, benefits, and alternatives to EP study and radiofrequency ablation for afib were also discussed in detail today. These risks include but are not limited to stroke, bleeding, vascular damage, tamponade, perforation, damage to the esophagus, lungs, and other structures, pulmonary vein stenosis, worsening renal function, and death. The patient understands these risk and wishes to proceed.  We will therefore proceed with catheter ablation at the next available time.

## 2012-08-10 NOTE — Addendum Note (Signed)
Addended by: Dennis Bast F on: 08/10/2012 03:01 PM   Modules accepted: Orders

## 2012-08-10 NOTE — Assessment & Plan Note (Signed)
Stable No change required today  

## 2012-08-10 NOTE — Patient Instructions (Signed)
Your physician has recommended that you have an ablation. Catheter ablation is a medical procedure used to treat some cardiac arrhythmias (irregular heartbeats). During catheter ablation, a long, thin, flexible tube is put into a blood vessel in your groin (upper thigh), or neck. This tube is called an ablation catheter. It is then guided to your heart through the blood vessel. Radio frequency waves destroy small areas of heart tissue where abnormal heartbeats may cause an arrhythmia to start. Please see the instruction sheet given to you today.  Gave patient dates  She is going to call me back with a date   Your physician recommends that you return for lab work today

## 2012-08-10 NOTE — Assessment & Plan Note (Signed)
EF previously normalized Continue current medicine therapy

## 2012-08-10 NOTE — Telephone Encounter (Signed)
New problem:  1.  Date given 10/29.    2.  Has some question regarding recovery    3. Lab results.

## 2012-08-10 NOTE — Telephone Encounter (Signed)
Spoke with her and let her know I will sch for 09/06/12 and call her back

## 2012-08-11 NOTE — Telephone Encounter (Signed)
Tried to call her about  Scheduling TEE with no answer, will set up on Monday or Tuesday and call her then.

## 2012-08-16 ENCOUNTER — Encounter: Payer: Self-pay | Admitting: *Deleted

## 2012-08-16 ENCOUNTER — Other Ambulatory Visit: Payer: Self-pay | Admitting: *Deleted

## 2012-08-16 DIAGNOSIS — I4891 Unspecified atrial fibrillation: Secondary | ICD-10-CM

## 2012-08-16 NOTE — Progress Notes (Signed)
This encounter was created in error - please disregard.

## 2012-08-16 NOTE — Telephone Encounter (Signed)
Left message on machine -- Labs for upcoming procedure will be scheduled for the 22nd  If there is a need for a different day then they can call us and also if they don't receive a letter from [place] (which I am going to put in mail today) then let us know on this day and we can give another copy to her then.

## 2012-08-17 NOTE — Telephone Encounter (Signed)
Spoke with pt labs good and gave instructions  She is very nervous

## 2012-08-23 ENCOUNTER — Ambulatory Visit (INDEPENDENT_AMBULATORY_CARE_PROVIDER_SITE_OTHER): Payer: Medicare Other

## 2012-08-23 ENCOUNTER — Telehealth: Payer: Self-pay | Admitting: Internal Medicine

## 2012-08-23 DIAGNOSIS — Z7901 Long term (current) use of anticoagulants: Secondary | ICD-10-CM

## 2012-08-23 DIAGNOSIS — I4891 Unspecified atrial fibrillation: Secondary | ICD-10-CM

## 2012-08-23 LAB — POCT INR: INR: 3.7

## 2012-08-23 NOTE — Telephone Encounter (Signed)
New problem:  Has question regarding am medication & recovery time

## 2012-08-23 NOTE — Telephone Encounter (Signed)
lmom for patient to call me back. 

## 2012-08-23 NOTE — Telephone Encounter (Signed)
Called patient and left

## 2012-08-24 NOTE — Telephone Encounter (Signed)
lmom for pt to return my call if she still has questions

## 2012-08-29 ENCOUNTER — Encounter (HOSPITAL_COMMUNITY): Payer: Self-pay | Admitting: Pharmacy Technician

## 2012-08-30 ENCOUNTER — Other Ambulatory Visit (INDEPENDENT_AMBULATORY_CARE_PROVIDER_SITE_OTHER): Payer: Medicare Other

## 2012-08-30 ENCOUNTER — Other Ambulatory Visit: Payer: Self-pay | Admitting: Cardiology

## 2012-08-30 ENCOUNTER — Ambulatory Visit (INDEPENDENT_AMBULATORY_CARE_PROVIDER_SITE_OTHER): Payer: Medicare Other | Admitting: *Deleted

## 2012-08-30 DIAGNOSIS — I5022 Chronic systolic (congestive) heart failure: Secondary | ICD-10-CM

## 2012-08-30 DIAGNOSIS — Z7901 Long term (current) use of anticoagulants: Secondary | ICD-10-CM

## 2012-08-30 DIAGNOSIS — I4891 Unspecified atrial fibrillation: Secondary | ICD-10-CM

## 2012-08-30 LAB — CBC WITH DIFFERENTIAL/PLATELET
Basophils Absolute: 0 10*3/uL (ref 0.0–0.1)
Basophils Relative: 0.8 % (ref 0.0–3.0)
Eosinophils Absolute: 0.1 10*3/uL (ref 0.0–0.7)
Eosinophils Relative: 1 % (ref 0.0–5.0)
HCT: 44.4 % (ref 36.0–46.0)
Hemoglobin: 14.7 g/dL (ref 12.0–15.0)
Lymphocytes Relative: 26.6 % (ref 12.0–46.0)
Lymphs Abs: 1.6 10*3/uL (ref 0.7–4.0)
MCHC: 33.1 g/dL (ref 30.0–36.0)
MCV: 100.2 fl — ABNORMAL HIGH (ref 78.0–100.0)
Monocytes Absolute: 0.6 10*3/uL (ref 0.1–1.0)
Monocytes Relative: 9.2 % (ref 3.0–12.0)
Neutro Abs: 3.7 10*3/uL (ref 1.4–7.7)
Neutrophils Relative %: 62.4 % (ref 43.0–77.0)
Platelets: 292 10*3/uL (ref 150.0–400.0)
RBC: 4.43 Mil/uL (ref 3.87–5.11)
RDW: 13.1 % (ref 11.5–14.6)
WBC: 6 10*3/uL (ref 4.5–10.5)

## 2012-08-30 LAB — BASIC METABOLIC PANEL
BUN: 13 mg/dL (ref 6–23)
CO2: 31 mEq/L (ref 19–32)
Calcium: 9.6 mg/dL (ref 8.4–10.5)
Chloride: 101 mEq/L (ref 96–112)
Creatinine, Ser: 0.5 mg/dL (ref 0.4–1.2)
GFR: 158.54 mL/min (ref >60.00)
Glucose, Bld: 101 mg/dL — ABNORMAL HIGH (ref 70–99)
Potassium: 4.6 mEq/L (ref 3.5–5.1)
Sodium: 139 mEq/L (ref 135–145)

## 2012-08-30 LAB — POCT INR: INR: 2

## 2012-08-30 LAB — PROTIME-INR
INR: 2.1 ratio — ABNORMAL HIGH (ref 0.8–1.0)
Prothrombin Time: 21.8 s — ABNORMAL HIGH (ref 10.2–12.4)

## 2012-09-01 ENCOUNTER — Other Ambulatory Visit: Payer: Self-pay | Admitting: Cardiology

## 2012-09-01 NOTE — Telephone Encounter (Signed)
Fax Received. Refill Completed. Aubreyana Saltz Chowoe (R.M.A)   

## 2012-09-05 ENCOUNTER — Telehealth: Payer: Self-pay | Admitting: Internal Medicine

## 2012-09-05 NOTE — Telephone Encounter (Signed)
New problem:  Has 3 questions to ask .   1.Clarification:   On instruction letter stating:   Direction do not take these medication . In all CAPS take all med's as normals.   2. If the INR is not in range will the procedure continue or not.    3. Test results - lab .

## 2012-09-05 NOTE — Telephone Encounter (Signed)
lmom for patient that her labs looked good  Her INR was 2.1  Keep taking her medications and okay to take her medications the morning of her procedure with a small sip of water.  Procedure is on as schedules

## 2012-09-06 ENCOUNTER — Encounter: Payer: Self-pay | Admitting: *Deleted

## 2012-09-06 ENCOUNTER — Ambulatory Visit (INDEPENDENT_AMBULATORY_CARE_PROVIDER_SITE_OTHER): Payer: Medicare Other | Admitting: *Deleted

## 2012-09-06 DIAGNOSIS — I4891 Unspecified atrial fibrillation: Secondary | ICD-10-CM

## 2012-09-06 DIAGNOSIS — Z7901 Long term (current) use of anticoagulants: Secondary | ICD-10-CM

## 2012-09-06 LAB — POCT INR: INR: 3.4

## 2012-09-13 ENCOUNTER — Ambulatory Visit (INDEPENDENT_AMBULATORY_CARE_PROVIDER_SITE_OTHER): Payer: Medicare Other

## 2012-09-13 DIAGNOSIS — Z7901 Long term (current) use of anticoagulants: Secondary | ICD-10-CM

## 2012-09-13 DIAGNOSIS — I4891 Unspecified atrial fibrillation: Secondary | ICD-10-CM

## 2012-09-13 LAB — POCT INR: INR: 3.6

## 2012-09-16 ENCOUNTER — Ambulatory Visit (INDEPENDENT_AMBULATORY_CARE_PROVIDER_SITE_OTHER): Payer: Medicare Other | Admitting: *Deleted

## 2012-09-16 ENCOUNTER — Other Ambulatory Visit: Payer: Self-pay | Admitting: *Deleted

## 2012-09-16 DIAGNOSIS — R0602 Shortness of breath: Secondary | ICD-10-CM

## 2012-09-16 DIAGNOSIS — I4891 Unspecified atrial fibrillation: Secondary | ICD-10-CM

## 2012-09-16 DIAGNOSIS — Z7901 Long term (current) use of anticoagulants: Secondary | ICD-10-CM

## 2012-09-16 DIAGNOSIS — I10 Essential (primary) hypertension: Secondary | ICD-10-CM

## 2012-09-16 DIAGNOSIS — I5022 Chronic systolic (congestive) heart failure: Secondary | ICD-10-CM

## 2012-09-16 LAB — CBC WITH DIFFERENTIAL/PLATELET
Basophils Absolute: 0 10*3/uL (ref 0.0–0.1)
Basophils Relative: 0 % (ref 0–1)
Eosinophils Absolute: 0.1 10*3/uL (ref 0.0–0.7)
Eosinophils Relative: 1 % (ref 0–5)
HCT: 40.9 % (ref 36.0–46.0)
Hemoglobin: 14.2 g/dL (ref 12.0–15.0)
Lymphocytes Relative: 27 % (ref 12–46)
Lymphs Abs: 1.9 10*3/uL (ref 0.7–4.0)
MCH: 33.6 pg (ref 26.0–34.0)
MCHC: 34.7 g/dL (ref 30.0–36.0)
MCV: 96.7 fL (ref 78.0–100.0)
Monocytes Absolute: 0.7 10*3/uL (ref 0.1–1.0)
Monocytes Relative: 10 % (ref 3–12)
Neutro Abs: 4.3 10*3/uL (ref 1.7–7.7)
Neutrophils Relative %: 62 % (ref 43–77)
Platelets: 288 10*3/uL (ref 150–400)
RBC: 4.23 MIL/uL (ref 3.87–5.11)
RDW: 13.1 % (ref 11.5–15.5)
WBC: 7 10*3/uL (ref 4.0–10.5)

## 2012-09-16 LAB — BASIC METABOLIC PANEL
BUN: 17 mg/dL (ref 6–23)
CO2: 25 mEq/L (ref 19–32)
Calcium: 9.3 mg/dL (ref 8.4–10.5)
Chloride: 101 mEq/L (ref 96–112)
Creat: 0.59 mg/dL (ref 0.50–1.10)
Glucose, Bld: 93 mg/dL (ref 70–99)
Potassium: 4.6 mEq/L (ref 3.5–5.3)
Sodium: 136 mEq/L (ref 135–145)

## 2012-09-16 LAB — PROTIME-INR

## 2012-09-16 LAB — BRAIN NATRIURETIC PEPTIDE: Brain Natriuretic Peptide: 18 pg/mL (ref 0.0–100.0)

## 2012-09-16 LAB — POCT INR: INR: 2.2

## 2012-09-19 ENCOUNTER — Telehealth: Payer: Self-pay | Admitting: Internal Medicine

## 2012-09-19 NOTE — Telephone Encounter (Signed)
Spoke with pt. Pt in for labs on Friday 09/16/12. Pt states she had labs done that she does not think she should have had done. It looks like pt had BMET/BNP/CBC/PT/INR/Liver profile/ Lipid profile 09/16/12. Pt would like this information corrected in her medical record. Pt requesting that I forward this to Dr Jenel Lucks nurse, Tresa Endo for review and follow up 09/20/12. Pt also had concerns about information about her coumadin dosing. I will forward to [facility] to follow-up with pt about coumadin documentation and information. I will also forward this to Gina L.

## 2012-09-19 NOTE — Telephone Encounter (Signed)
Pt calling re some information in chart is incorrect, her coumadin level, her diagnosis's, her lab orders, she wants to talk to a nurse and get this info corrected, she has a procedure coming up and needs corrected asap

## 2012-09-19 NOTE — Telephone Encounter (Signed)
Spoke with patient.  She took the wrong dose of Coumadin on Friday ( 1 1/2 tablets versus 1 tablet) and wants to know what to do.  She is scheduled to only take 1 1/2 tablets one day of the week (monday).  Will have her take 1 tablet today then recheck INR tomorrow to make sure it is not too high/low.  Pt also discussed labs issues on Friday with me.  Pt was scheduled to just have pre-op labs done for ablation on Thursday but instead had several extra labs done.  She is concerned because the sheet she received from the lab had SOB listed on it.  Pt had talked to [place] on Friday after her visit, and Tresa Endo verified that a mistake had been make and those labs should not have been drawn.  I reassured patient that SOB was not listed as a problem in her chart; it was just entered as a diagnosis for one of the incorrect labs (BNP).  I have talked Doylene Bode, our clinical manager and she is going to research why the problem occurred further.   Pt is agreeable.

## 2012-09-20 NOTE — Telephone Encounter (Signed)
Called patient and lmom letting her know that because she took the incorrect dose of Coumadin and  Dr Johney Frame feels it is warranted to get another INR.  Also let her know I have spoken to my nurse manager Doylene Bode, RN and she will not be charged for the labs ordered in error.  She will only be charged for the CBC and BMP.  She can call me back if she has further questions

## 2012-09-20 NOTE — Telephone Encounter (Signed)
Pt calling back to talk with kelly re some questions she has, also was told to come in for inr check tomorrow, I rs appt from today to tomorrow, however she doesn't feel she need to even come in at all, pls call [phone]

## 2012-09-21 ENCOUNTER — Ambulatory Visit (HOSPITAL_COMMUNITY)
Admission: RE | Admit: 2012-09-21 | Discharge: 2012-09-21 | Disposition: A | Discharge status: Home / Self Care | Payer: Medicare Other | Source: Ambulatory Visit | Attending: Internal Medicine | Admitting: Internal Medicine

## 2012-09-21 ENCOUNTER — Encounter (HOSPITAL_COMMUNITY)
Admission: RE | Disposition: A | Discharge status: Home / Self Care | Payer: Self-pay | Source: Ambulatory Visit | Attending: Internal Medicine

## 2012-09-21 ENCOUNTER — Encounter (HOSPITAL_COMMUNITY): Payer: Self-pay | Admitting: *Deleted

## 2012-09-21 ENCOUNTER — Telehealth: Payer: Self-pay | Admitting: Internal Medicine

## 2012-09-21 ENCOUNTER — Ambulatory Visit (INDEPENDENT_AMBULATORY_CARE_PROVIDER_SITE_OTHER): Payer: Medicare Other | Admitting: *Deleted

## 2012-09-21 DIAGNOSIS — I10 Essential (primary) hypertension: Secondary | ICD-10-CM | POA: Insufficient documentation

## 2012-09-21 DIAGNOSIS — Z7901 Long term (current) use of anticoagulants: Secondary | ICD-10-CM

## 2012-09-21 DIAGNOSIS — I4891 Unspecified atrial fibrillation: Secondary | ICD-10-CM

## 2012-09-21 DIAGNOSIS — I428 Other cardiomyopathies: Secondary | ICD-10-CM | POA: Insufficient documentation

## 2012-09-21 HISTORY — PX: TEE WITHOUT CARDIOVERSION: SHX5443

## 2012-09-21 LAB — POCT INR: INR: 2.6

## 2012-09-21 SURGERY — ECHOCARDIOGRAM, TRANSESOPHAGEAL
Anesthesia: Moderate Sedation

## 2012-09-21 MED ORDER — SODIUM CHLORIDE 0.9 % IV SOLN
INTRAVENOUS | Status: DC
  Administered 2012-09-21: 500 mL via INTRAVENOUS

## 2012-09-21 MED ORDER — FENTANYL CITRATE 0.05 MG/ML IJ SOLN
INTRAMUSCULAR | Status: DC | PRN
  Administered 2012-09-21 (×2): 25 ug via INTRAVENOUS

## 2012-09-21 MED ORDER — BUTAMBEN-TETRACAINE-BENZOCAINE 2-2-14 % EX AERO
INHALATION_SPRAY | CUTANEOUS | Status: DC | PRN
  Administered 2012-09-21: 2 via TOPICAL

## 2012-09-21 MED ORDER — MIDAZOLAM HCL 5 MG/ML IJ SOLN
INTRAMUSCULAR | Status: AC
  Filled 2012-09-21: qty 2

## 2012-09-21 MED ORDER — FENTANYL CITRATE 0.05 MG/ML IJ SOLN
INTRAMUSCULAR | Status: AC
  Filled 2012-09-21: qty 2

## 2012-09-21 MED ORDER — MIDAZOLAM HCL 10 MG/2ML IJ SOLN
INTRAMUSCULAR | Status: DC | PRN
  Administered 2012-09-21: 2 mg via INTRAVENOUS
  Administered 2012-09-21 (×4): 1 mg via INTRAVENOUS

## 2012-09-21 NOTE — Progress Notes (Signed)
*  PRELIMINARY RESULTS* Echocardiogram Echocardiogram Transesophageal has been performed.  Maria Arnold 09/21/2012, 11:59 AM

## 2012-09-21 NOTE — H&P (Signed)
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 She presents today for EP follow-up. She was last seen by me a year ago and scheduled for ablation. She reports that her father died and she was unable to keep the scheduled time for the procedure. She now returns for further consideration of ablation. She continues to have intermittent episodes of atrial fibrillation despite tikosyn. She reports fatigue and bradycardia with her medical therapy. She denies CP, SOB, orthopnea, PND, presyncope, syncope, or other concerns today.  Past Medical History   Diagnosis  Date   .  Cardiomyopathy      Nonischemic. LHC (2/12): No coronary disease, EF 30-35% with periapical severe hypokinesis. Echo (2/12): EF 30-35%, periapical severe hypokinesis, normal RV, mild to moderate MR. 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   .  Asthma    .  HTN (hypertension)    .  Foot pain      From bone spur   .  Back pain      Low back   .  PAF (paroxysmal atrial fibrillation)      with rapid ventricular response. She is on coumadin and dofetilide. She has had some breakthrough atrial fib on dofetilide.    No past surgical history on file.  Current outpatient prescriptions:Ascorbic Acid (VITAMIN C) 500 MG tablet, Take 500 mg by mouth daily. , Disp: , Rfl: ; Calcium-Magnesium-Zinc (CAL-MAG-ZINC PO), Take 1 tablet by mouth daily. , Disp: , Rfl: ; carvedilol (COREG) 3.125 MG tablet, Take 1 tablet (3.125 mg total) by mouth 2 (two) times daily with a meal., Disp: 60 tablet, Rfl: 3; cefpodoxime (VANTIN) 200 MG tablet, FOR 14 DAYS ./CY, Disp: 28 tablet, Rfl: 1  Cyanocobalamin (VITAMIN B-12 CR PO), Take 3 tablets by mouth daily. , Disp: , Rfl: ; diazepam (VALIUM) 5 MG tablet, Take 5 mg by mouth at bedtime as needed. For sleep., Disp: , Rfl: ; dofetilide (TIKOSYN) 250 MCG  capsule, Take 1 capsule (250 mcg total) by mouth 2 (two) times daily., Disp: 60 capsule, Rfl: 11; losartan (COZAAR) 25 MG tablet, TAKE 1 TABLET EVERY DAY, Disp: 30 tablet, Rfl: 6  Multiple Vitamin (MULTIVITAMIN) capsule, Take 1 capsule by mouth daily. , Disp: , Rfl: ; warfarin (COUMADIN) 7.5 MG tablet, Take 1 tablet (7.5 mg total) by mouth daily. Take 1 tablet daily except 1.5 tablets on Mondays and Fridays OR As Directed, Disp: 35 tablet, Rfl: 1; warfarin (COUMADIN) 7.5 MG tablet, Take as directed by anticoagulation clinic, Disp: 40 tablet, Rfl: 3  Allergies   Allergen  Reactions   .  Lisinopril  Cough    History    Social History   .  Marital Status:  Married     Spouse Name:  N/A     Number of Children:  2   .  Years of Education:  N/A    Occupational History   .   Other    Social History Main Topics   .  Smoking status:  Former Games developer   .  Smokeless tobacco:  Not on file   .  Alcohol Use:  0.0 oz/week      drinks occasional ETOH, none recently   .  Drug Use:  No   .  Sexually Active:  Not on file    Other Topics  Concern   .  Not on file    Social History  Narrative    Lives in [place] with husband. 2 children, 3 grandchildren. Nonsmoker, occasional ETOH.    Family History   Problem  Relation  Age of Onset   .  Coronary artery disease  Neg Hx       Premature    ROS- All systems are reviewed and are negative except as outlined in the HPI above  Physical Exam:  Filed Vitals:    08/10/12 1425   BP:  130/80   Pulse:  54   Height:  5\' 2"  (1.575 m)   Weight:  159 lb (72.122 kg)    GEN- The patient is well appearing, alert and oriented x 3 today.  Head- normocephalic, atraumatic  Eyes- Sclera clear, conjunctiva pink  Ears- hearing intact  Oropharynx- clear  Neck- supple, no JVP  Lymph- no cervical lymphadenopathy  Lungs- Clear to ausculation bilaterally, normal work of breathing  Heart- Regular rate and rhythm, no murmurs, rubs or gallops, PMI not laterally  displaced  GI- soft, NT, ND, + BS  Extremities- no clubbing, cyanosis, or edema  ekg today reveals sinus rhythm 44 bpm, Qtc 338   ATRIAL FIBRILLATION -  She continues to have intermittent episodes of afib despite tikosyn. Given intermittent compliance, she is a poor candidate for tikosyn long term.  Therapeutic strategies for afib including medicine and ablation were discussed in detail with the patient today. Risk, benefits, and alternatives to EP study and radiofrequency ablation for afib were also discussed in detail today. These risks include but are not limited to stroke, bleeding, vascular damage, tamponade, perforation, damage to the esophagus, lungs, and other structures, pulmonary vein stenosis, worsening renal function, and death. The patient understands these risk and wishes to proceed. We will therefore proceed with catheter ablation at the next available time.   Will proceed with TEE prior to RF ablation.  Hypertension -   Stable  No change required today   Vesta Mixer, Montez Hageman., MD, [facility] 09/21/2012, 10:11 AM Office - [phone] Pager [phone]

## 2012-09-21 NOTE — Telephone Encounter (Signed)
plz return call to pt at [phone] regarding labs and medical documentation

## 2012-09-21 NOTE — Telephone Encounter (Signed)
I have spoken with [facility] labs and the patient will not be charged for the BNP that was inadvertently ordered.

## 2012-09-21 NOTE — Interval H&P Note (Signed)
History and Physical Interval Note:  09/21/2012 10:12 AM  Maria Arnold  has presented today for surgery, with the diagnosis of ablation at 1200  The various methods of treatment have been discussed with the patient and family. After consideration of risks, benefits and other options for treatment, the patient has consented to  Procedure(s) (LRB) with comments: TRANSESOPHAGEAL ECHOCARDIOGRAM (TEE) (N/A) as a surgical intervention .  The patient's history has been reviewed, patient examined, no change in status, stable for surgery.  I have reviewed the patient's chart and labs.  Questions were answered to the patient's satisfaction.     Elyn Aquas.

## 2012-09-21 NOTE — CV Procedure (Signed)
    Transesophageal Echocardiogram Note  Karon Cotterill [id number] December 14, 1945  Procedure: Transesophageal Echocardiogram Indications: atrial fib  Procedure Details Consent: Obtained Time Out: Verified patient identification, verified procedure, site/side was marked, verified correct patient position, special equipment/implants available, Radiology Safety Procedures followed,  medications/allergies/relevent history reviewed, required imaging and test results available.  Performed  Medications: Fentanyl: 50 mcg IV Versed: 6 mg IV  Left Ventrical:  Low normal LV function.  EF 50%  Mitral Valve: trace MR  Aortic Valve: normal AV  Tricuspid Valve: trace - mild TR  Pulmonic Valve: normal  Left Atrium/ Left atrial appendage: normal, no thrombus  Atrial septum: normal , no PFO or ASD by color doppler  Aorta: mild plaque   Complications: No apparent complications Patient did tolerate procedure well.     Vesta Mixer, Montez Hageman., MD, [facility] 09/21/2012, 10:34 AM

## 2012-09-22 ENCOUNTER — Ambulatory Visit (HOSPITAL_COMMUNITY): Payer: Medicare Other | Admitting: Certified Registered Nurse Anesthetist

## 2012-09-22 ENCOUNTER — Encounter (HOSPITAL_COMMUNITY): Payer: Self-pay | Admitting: Certified Registered Nurse Anesthetist

## 2012-09-22 ENCOUNTER — Ambulatory Visit (HOSPITAL_COMMUNITY)
Admission: RE | Admit: 2012-09-22 | Discharge: 2012-09-23 | Disposition: A | Discharge status: Home / Self Care | Payer: Medicare Other | Source: Ambulatory Visit | Attending: Internal Medicine | Admitting: Internal Medicine

## 2012-09-22 ENCOUNTER — Encounter (HOSPITAL_COMMUNITY)
Admission: RE | Disposition: A | Discharge status: Home / Self Care | Payer: Self-pay | Source: Ambulatory Visit | Attending: Internal Medicine

## 2012-09-22 ENCOUNTER — Encounter (HOSPITAL_COMMUNITY): Payer: Self-pay | Admitting: *Deleted

## 2012-09-22 DIAGNOSIS — I4891 Unspecified atrial fibrillation: Secondary | ICD-10-CM

## 2012-09-22 DIAGNOSIS — I5022 Chronic systolic (congestive) heart failure: Secondary | ICD-10-CM | POA: Diagnosis present

## 2012-09-22 DIAGNOSIS — Z7901 Long term (current) use of anticoagulants: Secondary | ICD-10-CM

## 2012-09-22 DIAGNOSIS — Z79899 Other long term (current) drug therapy: Secondary | ICD-10-CM | POA: Insufficient documentation

## 2012-09-22 DIAGNOSIS — I428 Other cardiomyopathies: Secondary | ICD-10-CM | POA: Insufficient documentation

## 2012-09-22 DIAGNOSIS — I10 Essential (primary) hypertension: Secondary | ICD-10-CM | POA: Insufficient documentation

## 2012-09-22 HISTORY — PX: ATRIAL FIBRILLATION ABLATION: SHX5456

## 2012-09-22 LAB — PROTIME-INR
INR: 2.13 — ABNORMAL HIGH (ref 0.00–1.49)
Prothrombin Time: 22.9 seconds — ABNORMAL HIGH (ref 11.6–15.2)

## 2012-09-22 LAB — MRSA PCR SCREENING: MRSA by PCR: NEGATIVE

## 2012-09-22 LAB — POCT ACTIVATED CLOTTING TIME
Activated Clotting Time: 304 seconds
Activated Clotting Time: 314 seconds
Activated Clotting Time: 309 seconds
Activated Clotting Time: 309 seconds
Activated Clotting Time: 155 seconds

## 2012-09-22 SURGERY — ATRIAL FIBRILLATION ABLATION
Anesthesia: Monitor Anesthesia Care

## 2012-09-22 MED ORDER — WARFARIN SODIUM 7.5 MG PO TABS
7.5000 mg | ORAL_TABLET | ORAL | Status: DC
  Administered 2012-09-22: 7.5 mg via ORAL
  Filled 2012-09-22: qty 1

## 2012-09-22 MED ORDER — SODIUM CHLORIDE 0.9 % IJ SOLN
3.0000 mL | Freq: Two times a day (BID) | INTRAMUSCULAR | Status: DC
  Administered 2012-09-22 – 2012-09-23 (×3): 3 mL via INTRAVENOUS

## 2012-09-22 MED ORDER — MIDAZOLAM HCL 5 MG/5ML IJ SOLN
INTRAMUSCULAR | Status: DC | PRN
  Administered 2012-09-22: 0.5 mg via INTRAVENOUS
  Administered 2012-09-22: 2 mg via INTRAVENOUS
  Administered 2012-09-22 (×2): 0.5 mg via INTRAVENOUS

## 2012-09-22 MED ORDER — HYDROXYUREA 500 MG PO CAPS
ORAL_CAPSULE | ORAL | Status: AC
  Filled 2012-09-22: qty 1

## 2012-09-22 MED ORDER — ONDANSETRON HCL 4 MG/2ML IJ SOLN: 4.0000 mg | Freq: Four times a day (QID) | INTRAMUSCULAR | Status: DC | PRN

## 2012-09-22 MED ORDER — HEPARIN SODIUM (PORCINE) 1000 UNIT/ML IJ SOLN
INTRAMUSCULAR | Status: AC
  Filled 2012-09-22: qty 1

## 2012-09-22 MED ORDER — SODIUM CHLORIDE 0.9 % IV SOLN: 250.0000 mL | INTRAVENOUS | Status: DC | PRN

## 2012-09-22 MED ORDER — PROTAMINE SULFATE 10 MG/ML IV SOLN
INTRAVENOUS | Status: DC | PRN
  Administered 2012-09-22 (×3): 10 mg via INTRAVENOUS

## 2012-09-22 MED ORDER — BUPIVACAINE HCL (PF) 0.25 % IJ SOLN
INTRAMUSCULAR | Status: AC
  Filled 2012-09-22: qty 30

## 2012-09-22 MED ORDER — LACTATED RINGERS IV SOLN
INTRAVENOUS | Status: DC | PRN
  Administered 2012-09-22: 08:00:00 via INTRAVENOUS

## 2012-09-22 MED ORDER — WARFARIN SODIUM 10 MG PO TABS
11.2500 mg | ORAL_TABLET | ORAL | Status: DC
  Filled 2012-09-22: qty 1

## 2012-09-22 MED ORDER — SODIUM CHLORIDE 0.9 % IJ SOLN: 3.0000 mL | INTRAMUSCULAR | Status: DC | PRN

## 2012-09-22 MED ORDER — WARFARIN - PHYSICIAN DOSING INPATIENT: Freq: Every day | Status: DC

## 2012-09-22 MED ORDER — DOFETILIDE 250 MCG PO CAPS
250.0000 ug | ORAL_CAPSULE | Freq: Two times a day (BID) | ORAL | Status: DC
  Administered 2012-09-22: 250 ug via ORAL
  Filled 2012-09-22 (×3): qty 1

## 2012-09-22 MED ORDER — HEPARIN SODIUM (PORCINE) 1000 UNIT/ML IJ SOLN
INTRAMUSCULAR | Status: DC | PRN
  Administered 2012-09-22: 1000 [IU] via INTRAVENOUS
  Administered 2012-09-22: 2000 [IU] via INTRAVENOUS
  Administered 2012-09-22: 8000 [IU] via INTRAVENOUS
  Administered 2012-09-22: 1000 [IU] via INTRAVENOUS

## 2012-09-22 MED ORDER — LIDOCAINE HCL (CARDIAC) 20 MG/ML IV SOLN: INTRAVENOUS | Status: DC | PRN

## 2012-09-22 MED ORDER — HYDROCODONE-ACETAMINOPHEN 5-325 MG PO TABS: 1.0000 | ORAL_TABLET | ORAL | Status: DC | PRN

## 2012-09-22 MED ORDER — WARFARIN SODIUM 7.5 MG PO TABS
7.5000 mg | ORAL_TABLET | Freq: Every day | ORAL | Status: DC
Start: 2012-09-22 — End: 2012-09-22

## 2012-09-22 MED ORDER — PROPOFOL INFUSION 10 MG/ML OPTIME
INTRAVENOUS | Status: DC | PRN
  Administered 2012-09-22: 50 ug/kg/min via INTRAVENOUS

## 2012-09-22 MED ORDER — DIAZEPAM 5 MG PO TABS
5.0000 mg | ORAL_TABLET | Freq: Every evening | ORAL | Status: DC | PRN
  Administered 2012-09-23: 5 mg via ORAL
  Filled 2012-09-22: qty 1

## 2012-09-22 MED ORDER — FENTANYL CITRATE 0.05 MG/ML IJ SOLN
INTRAMUSCULAR | Status: DC | PRN
  Administered 2012-09-22 (×2): 25 ug via INTRAVENOUS
  Administered 2012-09-22: 100 ug via INTRAVENOUS
  Administered 2012-09-22 (×2): 25 ug via INTRAVENOUS

## 2012-09-22 MED ORDER — DEXTROSE 5 % IV SOLN
INTRAVENOUS | Status: AC
  Filled 2012-09-22: qty 250

## 2012-09-22 MED ORDER — ACETAMINOPHEN 325 MG PO TABS
650.0000 mg | ORAL_TABLET | ORAL | Status: DC | PRN
  Filled 2012-09-22: qty 2

## 2012-09-22 MED ORDER — CARVEDILOL 3.125 MG PO TABS
3.1250 mg | ORAL_TABLET | Freq: Two times a day (BID) | ORAL | Status: DC
Start: 2012-09-22 — End: 2012-09-23
  Administered 2012-09-22 – 2012-09-23 (×2): 3.125 mg via ORAL
  Filled 2012-09-22 (×4): qty 1

## 2012-09-22 MED ORDER — LOSARTAN POTASSIUM 25 MG PO TABS: 25.0000 mg | ORAL_TABLET | Freq: Every day | ORAL | Status: DC

## 2012-09-22 NOTE — Anesthesia Postprocedure Evaluation (Signed)
  Anesthesia Post-op Note  Patient: Maria Arnold  Procedure(s) Performed: Procedure(s) (LRB) with comments: ATRIAL FIBRILLATION ABLATION (N/A)  Patient Location: Cath Lab  Anesthesia Type:MAC  Level of Consciousness: awake  Airway and Oxygen Therapy: Patient Spontanous Breathing  Post-op Pain: mild  Post-op Assessment: Post-op Vital signs reviewed  Post-op Vital Signs: Reviewed  Complications: No apparent anesthesia complications

## 2012-09-22 NOTE — Anesthesia Preprocedure Evaluation (Signed)
Anesthesia Evaluation  Patient identified by MRN, date of birth, ID band Patient awake    Reviewed: Allergy & Precautions, H&P , NPO status , Patient's Chart, lab work & pertinent test results  Airway Mallampati: II  Neck ROM: full    Dental   Pulmonary asthma ,          Cardiovascular hypertension, + dysrhythmias Atrial Fibrillation     Neuro/Psych    GI/Hepatic   Endo/Other    Renal/GU      Musculoskeletal   Abdominal   Peds  Hematology   Anesthesia Other Findings   Reproductive/Obstetrics                           Anesthesia Physical Anesthesia Plan  ASA: II  Anesthesia Plan: MAC   Post-op Pain Management:    Induction: Intravenous  Airway Management Planned: Simple Face Mask  Additional Equipment:   Intra-op Plan:   Post-operative Plan:   Informed Consent: I have reviewed the patients History and Physical, chart, labs and discussed the procedure including the risks, benefits and alternatives for the proposed anesthesia with the patient or authorized representative who has indicated his/her understanding and acceptance.     Plan Discussed with: CRNA and Surgeon  Anesthesia Plan Comments:         Anesthesia Quick Evaluation

## 2012-09-22 NOTE — H&P (Signed)
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 She presents today for EP follow-up. She was last seen by me a year ago and scheduled for ablation. She reports that her father died and she was unable to keep the scheduled time for the procedure. She now returns for further consideration of ablation. She continues to have intermittent episodes of atrial fibrillation despite tikosyn. She reports fatigue and bradycardia with her medical therapy. She denies CP, SOB, orthopnea, PND, presyncope, syncope, or other concerns today.   Past Medical History   Diagnosis  Date   .  Cardiomyopathy      Nonischemic. LHC (2/12): No coronary disease, EF 30-35% with periapical severe hypokinesis. Echo (2/12): EF 30-35%, periapical severe hypokinesis, normal RV, mild to moderate MR. 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   .  Asthma    .  HTN (hypertension)    .  Foot pain      From bone spur   .  Back pain      Low back   .  PAF (paroxysmal atrial fibrillation)      with rapid ventricular response. She is on coumadin and dofetilide. She has had some breakthrough atrial fib on dofetilide.   Current outpatient prescriptions:Ascorbic Acid (VITAMIN C) 500 MG tablet, Take 500 mg by mouth daily. , Disp: , Rfl: ; Calcium-Magnesium-Zinc (CAL-MAG-ZINC PO), Take 1 tablet by mouth daily. , Disp: , Rfl: ; carvedilol (COREG) 3.125 MG tablet, Take 1 tablet (3.125 mg total) by mouth 2 (two) times daily with a meal., Disp: 60 tablet, Rfl: 3; cefpodoxime (VANTIN) 200 MG tablet, FOR 14 DAYS ./CY, Disp: 28 tablet, Rfl: 1  Cyanocobalamin (VITAMIN B-12 CR PO), Take 3 tablets by mouth daily. , Disp: , Rfl: ; diazepam (VALIUM) 5 MG tablet, Take 5 mg by mouth at bedtime as needed. For sleep., Disp: , Rfl: ; dofetilide (TIKOSYN) 250 MCG capsule, Take 1 capsule (250 mcg  total) by mouth 2 (two) times daily., Disp: 60 capsule, Rfl: 11; losartan (COZAAR) 25 MG tablet, TAKE 1 TABLET EVERY DAY, Disp: 30 tablet, Rfl: 6  Multiple Vitamin (MULTIVITAMIN) capsule, Take 1 capsule by mouth daily. , Disp: , Rfl: ; warfarin (COUMADIN) 7.5 MG tablet, Take 1 tablet (7.5 mg total) by mouth daily. Take 1 tablet daily except 1.5 tablets on Mondays and Fridays OR As Directed, Disp: 35 tablet, Rfl: 1; warfarin (COUMADIN) 7.5 MG tablet, Take as directed by anticoagulation clinic, Disp: 40 tablet, Rfl: 3    Allergies   Allergen  Reactions   .  Lisinopril  Cough    History    Social History   .  Marital Status:  Married     Spouse Name:  N/A     Number of Children:  2   .  Years of Education:  N/A    Occupational History   .   Other    Social History Main Topics   .  Smoking status:  Former Games developer   .  Smokeless tobacco:  Not on file   .  Alcohol Use:  0.0 oz/week      drinks occasional ETOH, none recently   .  Drug Use:  No   .  Sexually Active:  Not on file    Other Topics  Concern   .  Not on file    Social History Narrative    Lives  in [facility] with husband. 2 children, 3 grandchildren. Nonsmoker, occasional ETOH.    Family History   Problem  Relation  Age of Onset   .  Coronary artery disease  Neg Hx       Premature    ROS- All systems are reviewed and are negative except as outlined in the HPI above  Physical Exam:  Filed Vitals:    08/10/12 1425   BP:  130/80   Pulse:  54   Height:  5\' 2"  (1.575 m)   Weight:  159 lb (72.122 kg)    GEN- The patient is well appearing, alert and oriented x 3 today.  Head- normocephalic, atraumatic  Eyes- Sclera clear, conjunctiva pink  Ears- hearing intact  Oropharynx- clear  Neck- supple, no JVP  Lymph- no cervical lymphadenopathy  Lungs- Clear to ausculation bilaterally, normal work of breathing  Heart- Regular rate and rhythm, no murmurs, rubs or gallops, PMI not laterally displaced  GI- soft, NT, ND, + BS    Extremities- no clubbing, cyanosis, or edema    ATRIAL FIBRILLATION  She continues to have intermittent episodes of afib despite tikosyn. Given intermittent compliance, she is a poor candidate for tikosyn long term.  Therapeutic strategies for afib including medicine and ablation were discussed in detail with the patient today. Risk, benefits, and alternatives to EP study and radiofrequency ablation for afib were also discussed in detail today. These risks include but are not limited to stroke, bleeding, vascular damage, tamponade, perforation, damage to the esophagus, lungs, and other structures, pulmonary vein stenosis, worsening renal function, and death. The patient understands these risk and wishes to proceed. We will therefore proceed with catheter ablation at this time. TEE from 09/21/12 is reviewed.

## 2012-09-22 NOTE — Op Note (Signed)
SURGEON:  Hillis Range, MD  PREPROCEDURE DIAGNOSES: 1. Paroxysmal atrial fibrillation.  POSTPROCEDURE DIAGNOSES: 1. Paroxysmal  atrial fibrillation.  PROCEDURES: 1. Comprehensive electrophysiologic study. 2. Coronary sinus pacing and recording. 3. Three-dimensional mapping of atrial fibrillation with additional mapping and ablation of a second discrete focus within the right atrium 4. Ablation of atrial fibrillation with additional mapping and ablation of a second discrete focus within the right atrium. 5. Intracardiac echocardiography. 6. Transseptal puncture of an intact septum. 7. Rotational Angiography with processing at an independent workstation 8. Arrhythmia induction with pacing with isuprel infusion  INTRODUCTION:  Maria Arnold is a 66 y.o. female with a history of paroxysmal atrial fibrillation who now presents for EP study and radiofrequency ablation.  The patient reports initially being diagnosed with atrial fibrillation after presenting with symptomatic palpitations and fatgiue.  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 The patient reports increasing frequency and duration of atrial fibrillation since that time.  The patient has failed medical therapy with Joice Lofts and coreg.  The patient therefore presents today for catheter ablation of atrial fibrillation.  DESCRIPTION OF PROCEDURE:  Informed written consent was obtained, and the patient was brought to the electrophysiology lab in a fasting state.  The patient was adequately sedated with intravenous medications as outlined in the anesthesia report.  The patient's left and right groins were prepped and draped in the usual sterile fashion by the EP lab staff.  Using a percutaneous Seldinger technique, two 7-French and one 11-French hemostasis sheaths were placed into the right common femoral vein.   3 Dimensional Rotational Angiography: A 5 french pigtail catheter was introduced through the right common femoral  vein and advanced into the inferior venocava.  3 demential rotational angiography was then performed by power injection of 100cc of nonionic contrast.  Reprocessing at an independent work station was then performed.   This demonstrated a moderate sized left atrium with 4 separate pulmonary veins which were also moderate in size.  There were no anomalous veins or significant abnormalities.  A 3 dimensional rendering of the left atrium was then merged using [employer] onto the Well[employer] system and registered with intracardiac echo (see below).  The pigtail catheter was then removed.  Catheter Placement:  A 7-French Biosense Webster Decapolar coronary sinus catheter was introduced through the right common femoral vein and advanced into the coronary sinus for recording and pacing from this location.  A 6-French quadripolar Josephson catheter was introduced through the right common femoral vein and advanced into the right ventricle for recording and pacing.  This catheter was then pulled back to the His bundle location.    Initial Measurements: The patient presented to the electrophysiology lab in sinus rhythm.  Her PR interval measured 168 msec with a QRS duringation of and a QT interval of 420 msec.  The AH interval measured 90 and the HV interval measured 47 msec.     Intracardiac Echocardiography: A 10-French Biosense Webster AcuNav intracardiac echocardiography catheter was introduced through the left common femoral vein and advanced into the right atrium. Intracardiac echocardiography was performed of the left atrium, and a three-dimensional anatomical rendering of the left atrium was performed using CARTO sound technology.  The patient was noted to have a moderate sized left atrium.  The interatrial septum was prominent a small PFO was noted. All 4 pulmonary veins were visualized and noted to have separate ostia.  The pulmonary veins were moderate in size.  The left atrial  appendage  was visualized and did not reveal thrombus.   There was no evidence of pulmonary vein stenosis.   Transseptal Puncture: The middle right common femoral vein sheath was exchanged for an 8.5 [place] SL2 transseptal sheath and transseptal access was achieved in a standard fashion using a Brockenbrough needle under biplane fluoroscopy with intracardiac echocardiography confirmation of the transseptal puncture.  Once transseptal access had been achieved, heparin was administered intravenously and intra- arterially in order to maintain an ACT of greater than 350 seconds throughout the procedure.   3D Mapping and Ablation: The His bundle catheter was removed and in its place a 3.5 mm Biosense Webster EZ [employer] ablation catheter was advanced into the right atrium.  The transseptal sheath was pulled back into the IVC over a guidewire.  The ablation catheter was advanced across the transseptal hole using the wire as a guide.  The transseptal sheath was then re-advanced over the guidewire into the left atrium.  A duodecapolar Biosense Webster circular mapping catheter was introduced through the transseptal sheath and positioned over the mouth of all 4 pulmonary veins.  Three-dimensional electroanatomical mapping was performed using CARTO technology.  This demonstrated electrical activity within all four pulmonary veins at baseline. The patient underwent successful sequential electrical isolation and anatomical encircling of all four pulmonary veins using radiofrequency current with a circular mapping catheter as a guide.  There was initially prodigious conduction within the left superior and left inferior pulmonary veins.  She has frequent paroxysms of atrial fibrillation observed initially.  Upon isolating the left sided pulmonary veins, afib became quiescent.   Measurements Following Ablation: Following ablation, Isuprel was infused up to 20 mcg/min with no inducible atrial fibrillation, atrial  tachycardia, atrial flutter, or sustained PACs. In sinus rhythm with RR interval was 882, with PR , QRS 89 msec, and Qtc 395 msec.  Following ablation the AH interval measured with an HV interval of 46 msec. Ventricular pacing was performed, which revealed midline decremental VA conduction with a VA Wenckebach cycle length of 400 msec.  Rapid atrial pacing was performed, which revealed an AV Wenckebach cycle length of 340 msec.    Additional 3D Mapping and ablation of a second discrete focus: The circular mapping catheter was pulled back into the right atrium and 3D mapping was performed at the junction of the superior vena cava and right atrium.  Electrical activity was observed within the SVC.  I therefore elected to perform right atrial ablation in this area.  A series of radiofrequency applications were delivered in a circular fashion around the ostium of the SVC.  Prior to each ablation lesion, pacing was performed from the distal ablation electrode to insure that diaphragmatic stimulation was not observed to avoid phrenic nerve injury.  Diaphragmatic excursion was also observed during ablation.    The procedure was therefore considered completed.  All catheters were removed, and the sheaths were aspirated and flushed.  The patient was transferred to the recovery area for sheath removal per protocol.  A limited bedside transthoracic echocardiogram revealed no pericardial effusion.  There were no early apparent complications.  CONCLUSIONS: 1. Sinus rhythm upon presentation.   2. Rotational Angiography reveals a moderate sized left atrium with four separate pulmonary veins without evidence of pulmonary vein stenosis. 3. Successful electrical isolation and anatomical encircling of all four pulmonary veins with radiofrequency current.  The superior vena cava was also electrically isolated today 4. No inducible arrhythmias following ablation both on and off of Isuprel 5. No early  apparent  complications.   Fayrene Fearing Diella Gillingham,MD 11:37 AM 09/22/2012

## 2012-09-22 NOTE — Transfer of Care (Signed)
Immediate Anesthesia Transfer of Care Note  Patient: Maria Arnold  Procedure(s) Performed: Procedure(s) (LRB) with comments: ATRIAL FIBRILLATION ABLATION (N/A)  Patient Location: PACU  Anesthesia Type:MAC  Level of Consciousness: awake and alert   Airway & Oxygen Therapy: Patient Spontanous Breathing and Patient connected to nasal cannula oxygen  Post-op Assessment: Report given to PACU RN and Post -op Vital signs reviewed and stable  Post vital signs: Reviewed and stable  Complications: No apparent anesthesia complications

## 2012-09-22 NOTE — Preoperative (Signed)
Beta Blockers   Reason not to administer Beta Blockers:Not Applicable 

## 2012-09-23 ENCOUNTER — Telehealth: Payer: Self-pay | Admitting: Cardiology

## 2012-09-23 DIAGNOSIS — I4891 Unspecified atrial fibrillation: Secondary | ICD-10-CM

## 2012-09-23 LAB — BASIC METABOLIC PANEL
BUN: 10 mg/dL (ref 6–23)
CO2: 29 mEq/L (ref 19–32)
Calcium: 8.2 mg/dL — ABNORMAL LOW (ref 8.4–10.5)
Chloride: 100 mEq/L (ref 96–112)
Creatinine, Ser: 0.51 mg/dL (ref 0.50–1.10)
GFR calc Af Amer: >90 mL/min (ref >90)
GFR calc non Af Amer: >90 mL/min (ref >90)
Glucose, Bld: 113 mg/dL — ABNORMAL HIGH (ref 70–99)
Potassium: 3.8 mEq/L (ref 3.5–5.1)
Sodium: 135 mEq/L (ref 135–145)

## 2012-09-23 LAB — PROTIME-INR
INR: 2.05 — ABNORMAL HIGH (ref 0.00–1.49)
Prothrombin Time: 22.3 seconds — ABNORMAL HIGH (ref 11.6–15.2)

## 2012-09-23 MED ORDER — OXYCODONE HCL 5 MG PO TABS: 5.0000 mg | ORAL_TABLET | Freq: Once | ORAL | Status: DC | PRN

## 2012-09-23 MED ORDER — ONDANSETRON HCL 4 MG/2ML IJ SOLN: 4.0000 mg | Freq: Four times a day (QID) | INTRAMUSCULAR | Status: DC | PRN

## 2012-09-23 MED ORDER — PANTOPRAZOLE SODIUM 40 MG PO TBEC: 40.0000 mg | DELAYED_RELEASE_TABLET | Freq: Every day | ORAL | Status: DC

## 2012-09-23 MED ORDER — OXYCODONE HCL 5 MG/5ML PO SOLN: 5.0000 mg | Freq: Once | ORAL | Status: DC | PRN

## 2012-09-23 MED ORDER — DOFETILIDE 250 MCG PO CAPS
250.0000 ug | ORAL_CAPSULE | Freq: Two times a day (BID) | ORAL | Status: DC
  Administered 2012-09-23: 250 ug via ORAL
  Filled 2012-09-23 (×3): qty 1

## 2012-09-23 MED ORDER — FENTANYL CITRATE 0.05 MG/ML IJ SOLN: 25.0000 ug | INTRAMUSCULAR | Status: DC | PRN

## 2012-09-23 NOTE — Telephone Encounter (Signed)
LMTCB

## 2012-09-23 NOTE — Telephone Encounter (Signed)
plz return call to patient [phone] to discuss medication post ablation.

## 2012-09-23 NOTE — Progress Notes (Signed)
     Patient: Maria Arnold Date of Encounter: 09/23/2012, 7:45 AM Admit date: 09/22/2012     Subjective  Maria Arnold denies CP or SOB. She has had intermittent palpitations since 3 AM.   Objective  Physical Exam: Vitals: BP 152/80  Pulse 61  Temp 98 F (36.7 C) (Oral)  Resp 19  Ht 5\' 2"  (1.575 m)  Wt 169 lb 1.5 oz (76.7 kg)  BMI 30.93 kg/m2  SpO2 95% General: Well developed, well appearing 66 year old female in no acute distress. Neck: Supple. JVD not elevated. Lungs: Clear bilaterally to auscultation without wheezes, rales, or rhonchi. Breathing is unlabored. Heart: Regular S1 S2 without murmur, rub or gallop.  Abdomen: Soft, non-distended. Extremities: No clubbing or cyanosis. No edema.  Distal pedal pulses are 2+ and equal bilaterally. Groin site intact without bleeding or hematoma. Neuro: Alert and oriented X 3. Moves all extremities spontaneously. No focal deficits.  Intake/Output:  Intake/Output Summary (Last 24 hours) at 09/23/12 0745 Last data filed at 09/22/12 2200  Gross per 24 hour  Intake   1150 ml  Output    500 ml  Net    650 ml    Inpatient Medications:     . carvedilol  3.125 mg Oral BID WC  . [COMPLETED] dextrose      . dofetilide  250 mcg Oral BID  . [COMPLETED] hydroxyurea      . sodium chloride  3 mL Intravenous Q12H  . warfarin  11.25 mg Oral Custom  . warfarin  7.5 mg Oral Custom  . Warfarin - Physician Dosing Inpatient   Does not apply q1800  . [DISCONTINUED] dofetilide  250 mcg Oral BID  . [DISCONTINUED] losartan  25 mg Oral Daily  . [DISCONTINUED] warfarin  7.5 mg Oral Daily    Labs:  [facility] 09/23/12 0545  NA 135  K 3.8  CL 100  CO2 29  GLUCOSE 113*  BUN 10  CREATININE 0.51  CALCIUM 8.2*  MG --  PHOS --    INR 2.05   Radiology/Studies: No results found.  Telemetry: sinus rhythm, sinus bradycardia with occasional PACs/PVCs; intermittent atrial fibrillation this AM   Assessment and Plan  1. Atrial fibrillation s/p  RF ablation 2. NICM 3. HTN - mildly elevated this AM; ? add back losartan  As above. Maria Arnold is doing well post AFib ablation yesterday. She has experienced brief, nonsustained episodes of AFib this AM which are minimally symptomatic. She was provided reassurance that this is expected and common in the weeks following AFib ablation. She will continue Tikosyn and warfarin. She will continue carvedilol.  Signed, EDMISTEN, BROOKE PA-C  I have seen, examined the patient, and reviewed the above assessment and plan.  Changes to above are made where necessary.  Will discharge to home protonix x 6 weeks Routine instructions She would like to see me in 4-6 weeks for follow-up  Co Sign: Hillis Range, MD 09/23/2012 10:24 AM

## 2012-09-23 NOTE — Telephone Encounter (Signed)
Spoke with pt. Pt states she will continue taking losartan and not stop it right now as discussed earlier. Pt has a follow up appt scheduled with Dr Johney Frame 10/20/12. Pt was told she may be changed from coumadin to Eliquis at post hospital appt. Can this be handled by Dr Johney Frame  or does she need to schedule an appt with Dr Shirlee Latch? I will forward to Dr Shirlee Latch for review.

## 2012-09-23 NOTE — Telephone Encounter (Signed)
She should continue losartan.  She can see me in 2 months.  Suspect Dr. Johney Frame will change her over to Eliquis at 1 month.

## 2012-09-23 NOTE — Telephone Encounter (Signed)
NA-voice mail 

## 2012-09-23 NOTE — Discharge Summary (Signed)
ELECTROPHYSIOLOGY DISCHARGE SUMMARY    Patient IDYtzel Arnold,  MRN: [id number], DOB/AGE: 66-Jan-1947 66 y.o.  Admit date: 09/22/2012 Discharge date: 09/23/2012  Primary Care Physician: Aleatha Borer, MD Primary Cardiologist: Marca Ancona, MD  Primary Discharge Diagnosis:  1. Atrial fibrillation s/p RF ablation 2. HTN  Secondary Discharge Diagnoses:  1. NICM, felt to be tachycardia-mediated; LVEF now normalized 2. Asthma  Procedures This Admission:  Procedures: 1. Comprehensive electrophysiologic study.  2. Coronary sinus pacing and recording.  3. Three-dimensional mapping of atrial fibrillation with additional mapping and ablation of a second discrete focus within the right atrium  4. Ablation of atrial fibrillation with additional mapping and ablation of a second discrete focus within the right atrium.  5. Intracardiac echocardiography.  6. Transseptal puncture of an intact septum.  7. Rotational Angiography with processing at an independent workstation  8. Arrhythmia induction with pacing with isuprel infusion Conclusions:  1. Sinus rhythm upon presentation.  2. Rotational Angiography reveals a moderate sized left atrium with four separate pulmonary veins without evidence of pulmonary vein stenosis.  3. Successful electrical isolation and anatomical encircling of all four pulmonary veins with radiofrequency current. The superior vena cava was also electrically isolated today  4. No inducible arrhythmias following ablation both on and off of Isuprel  5. No early apparent complications.  History and Hospital Course:  [name] is a pleasant 66 year old woman with HTN, symptomatic atrial fibrillation, NICM, now with LVEF normalized who presented yesterday for AFib ablation. She tolerated this procedure well without any immediate complication. She remains hemodynamically stable and afebrile. Her groin site is intact without significant bleeding or hematoma. She has been  given discharge instructions including wound care and activity restrictions. She will follow-up in clinic in 4-6 weeks. She has been seen, examined and deemed stable for discharge today by Dr. Hillis Range.  Discharge Vitals: Blood pressure 152/68, pulse 76, temperature 99.4 F (37.4 C), temperature source Oral, resp. rate 18, height 5\' 2"  (1.575 m), weight 169 lb 1.5 oz (76.7 kg), SpO2 95.00%.   Labs: Lab Results  Component Value Date   WBC 7.0 09/16/2012   HGB 14.2 09/16/2012   HCT 40.9 09/16/2012   MCV 96.7 09/16/2012   PLT 288 09/16/2012     Lab 09/23/12 0545  NA 135  K 3.8  CL 100  CO2 29  BUN 10  CREATININE 0.51  CALCIUM 8.2*  PROT --  BILITOT --  ALKPHOS --  ALT --  AST --  GLUCOSE 113*    Basename 09/23/12 0545  INR 2.05*    Disposition:  The patient is being discharged in stable condition.  Follow-up:     Follow-up Information    Follow up with Hillis Range, MD. On 10/20/2012. (At 10:45 AM)    Contact information:   [address] [place] [address] [phone]      Follow up with [facility]. On 09/29/2012. (At 4:00 PM for Coumadin follow-up (INR check))    Contact information:   [address] [place] [address] [phone]       Discharge Medications:    Medication List     As of 09/23/2012 10:53 AM    TAKE these medications         CAL-MAG-ZINC PO   Take 1 tablet by mouth daily.      carvedilol 3.125 MG tablet   Commonly known as: COREG   Take 3.125 mg by mouth 2 (  two) times daily with a meal.      diazepam 5 MG tablet   Commonly known as: VALIUM   Take 5 mg by mouth at bedtime as needed. For sleep.      dofetilide 250 MCG capsule   Commonly known as: TIKOSYN   Take 1 capsule (250 mcg total) by mouth 2 (two) times daily.      losartan 25 MG tablet   Commonly known as: COZAAR   Take 25 mg by mouth every morning.      multivitamin with minerals Tabs   Take 1 tablet by  mouth daily.      pantoprazole 40 MG tablet   Commonly known as: PROTONIX   Take 1 tablet (40 mg total) by mouth daily.      VITAMIN B-12 CR PO   Take 3 tablets by mouth daily.      vitamin C 500 MG tablet   Commonly known as: ASCORBIC ACID   Take 500 mg by mouth daily.      warfarin 7.5 MG tablet   Commonly known as: COUMADIN   Take 7.5 mg by mouth See admin instructions. Every evening at 6pm:  1 1/2 tablets (11.25 mg) on Mondays and 1 tablet (7.5 mg) on all other days       Duration of Discharge Encounter: Greater than 30 minutes including physician time.  Limmie Patricia, PA-C 09/23/2012, 10:53 AM    Hillis Range, MD

## 2012-09-26 NOTE — Telephone Encounter (Signed)
Pt advised,verbalized understanding. 

## 2012-09-30 ENCOUNTER — Ambulatory Visit (INDEPENDENT_AMBULATORY_CARE_PROVIDER_SITE_OTHER): Payer: Medicare Other | Admitting: *Deleted

## 2012-09-30 DIAGNOSIS — Z7901 Long term (current) use of anticoagulants: Secondary | ICD-10-CM

## 2012-09-30 DIAGNOSIS — I4891 Unspecified atrial fibrillation: Secondary | ICD-10-CM

## 2012-09-30 LAB — POCT INR: INR: 2.1

## 2012-10-05 ENCOUNTER — Telehealth: Payer: Self-pay | Admitting: Internal Medicine

## 2012-10-05 NOTE — Telephone Encounter (Signed)
Called her back and answered her questions.  She wanted to know if the small knot in her groin was normal.  I explained to her that it was.  There is no pain or swelling

## 2012-10-05 NOTE — Telephone Encounter (Signed)
Or [phone] pt has questions

## 2012-10-20 ENCOUNTER — Encounter: Payer: Medicare Other | Admitting: Internal Medicine

## 2012-10-28 ENCOUNTER — Other Ambulatory Visit: Payer: Self-pay | Admitting: *Deleted

## 2012-10-28 ENCOUNTER — Other Ambulatory Visit: Payer: Self-pay | Admitting: Cardiology

## 2012-11-14 ENCOUNTER — Encounter: Payer: Medicare Other | Admitting: Internal Medicine

## 2012-11-15 ENCOUNTER — Telehealth: Payer: Self-pay | Admitting: Cardiology

## 2012-11-15 NOTE — Telephone Encounter (Signed)
Pt advised.

## 2012-11-15 NOTE — Telephone Encounter (Signed)
To Dr Shirlee Latch for recommendations.

## 2012-11-15 NOTE — Telephone Encounter (Signed)
Pt calling re having a sick grandchild with the flu, she was on 2 week of doxycycline took last dose last night, is it ok to get flu shot??

## 2012-11-15 NOTE — Telephone Encounter (Signed)
As long as she is sick herself, can have flu shot.

## 2012-11-17 ENCOUNTER — Telehealth: Payer: Self-pay | Admitting: Internal Medicine

## 2012-11-17 NOTE — Telephone Encounter (Signed)
Spoke with pt, her appt was rescheduled to 12-14-12. She is anxious to be seen because dr allred had told her if she was doing fine she maybe able to stop some of her meds. She anxious to stop any meds she can. Will forward for dr allred review.

## 2012-11-17 NOTE — Telephone Encounter (Signed)
Pt can't make appt with allred 11-23-12 , needs to see him, can she be worked in?

## 2012-11-18 ENCOUNTER — Ambulatory Visit (INDEPENDENT_AMBULATORY_CARE_PROVIDER_SITE_OTHER): Payer: Medicare Other

## 2012-11-18 DIAGNOSIS — Z7901 Long term (current) use of anticoagulants: Secondary | ICD-10-CM

## 2012-11-18 DIAGNOSIS — I4891 Unspecified atrial fibrillation: Secondary | ICD-10-CM

## 2012-11-18 LAB — POCT INR: INR: 3.4

## 2012-11-21 ENCOUNTER — Ambulatory Visit: Payer: Medicare Other | Admitting: Cardiology

## 2012-11-21 NOTE — Telephone Encounter (Signed)
Continue current medicines Arrange routine follow-up at next available time.  Does not need to overbook.

## 2012-11-21 NOTE — Telephone Encounter (Signed)
lmom for patient that Dr Johney Frame wants her to continue all medications until she has a follow up  Which is scheduled for 12/14/12.

## 2012-11-23 ENCOUNTER — Encounter: Payer: Medicare Other | Admitting: Internal Medicine

## 2012-12-05 ENCOUNTER — Emergency Department (HOSPITAL_COMMUNITY): Payer: Medicare Other

## 2012-12-05 ENCOUNTER — Encounter (HOSPITAL_COMMUNITY): Payer: Self-pay | Admitting: *Deleted

## 2012-12-05 ENCOUNTER — Emergency Department (HOSPITAL_COMMUNITY)
Admission: EM | Admit: 2012-12-05 | Discharge: 2012-12-05 | Disposition: A | Discharge status: Home / Self Care | Payer: Medicare Other | Attending: Emergency Medicine | Admitting: Emergency Medicine

## 2012-12-05 ENCOUNTER — Encounter: Payer: Medicare Other | Admitting: Internal Medicine

## 2012-12-05 DIAGNOSIS — S0121XA Laceration without foreign body of nose, initial encounter: Secondary | ICD-10-CM

## 2012-12-05 DIAGNOSIS — Y9229 Other specified public building as the place of occurrence of the external cause: Secondary | ICD-10-CM | POA: Insufficient documentation

## 2012-12-05 DIAGNOSIS — I10 Essential (primary) hypertension: Secondary | ICD-10-CM | POA: Insufficient documentation

## 2012-12-05 DIAGNOSIS — Z8739 Personal history of other diseases of the musculoskeletal system and connective tissue: Secondary | ICD-10-CM | POA: Insufficient documentation

## 2012-12-05 DIAGNOSIS — S022XXA Fracture of nasal bones, initial encounter for closed fracture: Secondary | ICD-10-CM | POA: Insufficient documentation

## 2012-12-05 DIAGNOSIS — W268XXA Contact with other sharp object(s), not elsewhere classified, initial encounter: Secondary | ICD-10-CM | POA: Insufficient documentation

## 2012-12-05 DIAGNOSIS — Z23 Encounter for immunization: Secondary | ICD-10-CM | POA: Insufficient documentation

## 2012-12-05 DIAGNOSIS — H1130 Conjunctival hemorrhage, unspecified eye: Secondary | ICD-10-CM | POA: Insufficient documentation

## 2012-12-05 DIAGNOSIS — S0992XA Unspecified injury of nose, initial encounter: Secondary | ICD-10-CM

## 2012-12-05 DIAGNOSIS — Z79899 Other long term (current) drug therapy: Secondary | ICD-10-CM | POA: Insufficient documentation

## 2012-12-05 DIAGNOSIS — Y9301 Activity, walking, marching and hiking: Secondary | ICD-10-CM | POA: Insufficient documentation

## 2012-12-05 DIAGNOSIS — Z9889 Other specified postprocedural states: Secondary | ICD-10-CM | POA: Insufficient documentation

## 2012-12-05 DIAGNOSIS — I428 Other cardiomyopathies: Secondary | ICD-10-CM | POA: Insufficient documentation

## 2012-12-05 DIAGNOSIS — J45909 Unspecified asthma, uncomplicated: Secondary | ICD-10-CM | POA: Insufficient documentation

## 2012-12-05 DIAGNOSIS — S0120XA Unspecified open wound of nose, initial encounter: Secondary | ICD-10-CM | POA: Insufficient documentation

## 2012-12-05 DIAGNOSIS — Z87891 Personal history of nicotine dependence: Secondary | ICD-10-CM | POA: Insufficient documentation

## 2012-12-05 DIAGNOSIS — H109 Unspecified conjunctivitis: Secondary | ICD-10-CM | POA: Insufficient documentation

## 2012-12-05 DIAGNOSIS — Z7901 Long term (current) use of anticoagulants: Secondary | ICD-10-CM | POA: Insufficient documentation

## 2012-12-05 DIAGNOSIS — I4891 Unspecified atrial fibrillation: Secondary | ICD-10-CM | POA: Insufficient documentation

## 2012-12-05 MED ORDER — BACITRACIN-POLYMYXIN B OP OINT
TOPICAL_OINTMENT | Freq: Three times a day (TID) | OPHTHALMIC | Status: DC
  Filled 2012-12-05: qty 3.5

## 2012-12-05 MED ORDER — TETANUS-DIPHTHERIA TOXOIDS TD 5-2 LFU IM INJ
0.5000 mL | INJECTION | Freq: Once | INTRAMUSCULAR | Status: AC
  Administered 2012-12-05: 0.5 mL via INTRAMUSCULAR
  Filled 2012-12-05: qty 0.5

## 2012-12-05 MED ORDER — PROPARACAINE HCL 0.5 % OP SOLN
1.0000 [drp] | Freq: Once | OPHTHALMIC | Status: AC
  Administered 2012-12-05: 1 [drp] via OPHTHALMIC
  Filled 2012-12-05: qty 15

## 2012-12-05 MED ORDER — FLUORESCEIN SODIUM 1 MG OP STRP
1.0000 | ORAL_STRIP | Freq: Once | OPHTHALMIC | Status: AC
  Administered 2012-12-05: 1 via OPHTHALMIC
  Filled 2012-12-05: qty 1

## 2012-12-05 NOTE — ED Notes (Signed)
Pt states that [place] the airport [place] 1800 1[place]26 and ran face first into glass.  Pt states that her glasses hit her nose and created a .5 inch laceration.  Pt L eye is red and inflamed.  Pt states that something blew into her eye and she scratched her eye attempting to remove the irritant.  Pt is on coumadin.

## 2012-12-05 NOTE — ED Notes (Addendum)
Pt waiting for eye cream, pharmacy notified.

## 2012-12-05 NOTE — ED Provider Notes (Addendum)
History     CSN: [id number]  Arrival date & time 12/05/12  0001   First MD Initiated Contact with Patient 12/05/12 [phone]      Chief Complaint  Patient presents with  . Facial Injury  . Conjunctivitis  . Eye Injury  . Facial Laceration    (Consider location/radiation/quality/duration/timing/severity/associated sxs/prior treatment) HPILyl Arnold is a 67 y.o. female with a past medical history significant for paroxysmal atrial fibrillation being treated with Coumadin, presents with an injury to the bridge of her nose. Earlier this evening, patient was in the [place] airport and was browsing gift shops when she walked into a plate glass window. Patient was wearing her glasses and the glasses cut into her nose, she had some immediate bleeding, was attended to by airport paramedics. Patient called her cousin who is a cardiologist who advised her to get on the plane and to come home. Bleeding was hemostatic, she presents with a 1 cm laceration to the bridge of her nose. Patient's pain is minimal, it is sharp it is well localized. She's having no difficulty breathing, she had no epistaxis. Patient does have a history of having had a deviated septum has been surgically corrected.  Patient is complaining about a mild frontal headache at this time.  Patient also had some left eye irritation the other day, she was rubbing it it became red. She's not having a foreign body sensation at this time, she says this has happened to her in the past. Patient is unchanged  Tetanus is not up-to-date.   Past Medical History  Diagnosis Date  . Cardiomyopathy     Nonischemic.  LHC (2/12): No coronary disease, EF 30-35% with periapical severe  hypokinesis.  Echo (2/12): EF 30-35%, periapical severe hypokinesis, normal RV, mild to moderate MR.  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   . Asthma   . HTN (hypertension)   . Foot  pain     From bone spur  . Back pain     Low back  . PAF (paroxysmal atrial fibrillation)     with rapid ventricular response.  She is on coumadin and dofetilide.  She has had some breakthrough atrial fib on dofetilide.    Past Surgical History  Procedure Date  . Nasal sinus surgery   . Dilation and curettage of uterus   . Laparoscopic abdominal exploration   . Tee without cardioversion 09/21/2012    Procedure: TRANSESOPHAGEAL ECHOCARDIOGRAM (TEE);  Surgeon: Vesta Mixer, MD;  Location: [facility] ENDOSCOPY;  Service: Cardiovascular;  Laterality: N/A;    Family History  Problem Relation Age of Onset  . Coronary artery disease Neg Hx     Premature    History  Substance Use Topics  . Smoking status: Former Games developer  . Smokeless tobacco: Not on file  . Alcohol Use: 0.0 oz/week     Comment: drinks occasional ETOH, none recently    OB History    Grav Para Term Preterm Abortions TAB SAB Ect Mult Living                  Review of Systems At least 10pt or greater review of systems completed and are negative except where specified in the HPI.  Allergies  Lisinopril  Home Medications   Current Outpatient Rx  Name  Route  Sig  Dispense  Refill  . VITAMIN C 500 MG PO TABS   Oral   Take 500 mg by  mouth daily.           Marland Kitchen CAL-MAG-ZINC PO   Oral   Take 1 tablet by mouth daily.          Marland Kitchen CARVEDILOL 3.125 MG PO TABS   Oral   Take 3.125 mg by mouth 2 (two) times daily with a meal.         . DOFETILIDE 250 MCG PO CAPS   Oral   Take 1 capsule (250 mcg total) by mouth 2 (two) times daily.   60 capsule   11   . LOSARTAN POTASSIUM 25 MG PO TABS   Oral   Take 25 mg by mouth every morning.         . ADULT MULTIVITAMIN W/MINERALS CH   Oral   Take 1 tablet by mouth daily.         Marland Kitchen PANTOPRAZOLE SODIUM 40 MG PO TBEC   Oral   Take 1 tablet (40 mg total) by mouth daily.   30 tablet   3   . WARFARIN SODIUM 7.5 MG PO TABS   Oral   Take 7.5 mg by mouth every  evening.         Marland Kitchen VITAMIN B-12 CR PO   Oral   Take 3 tablets by mouth daily.          Marland Kitchen DIAZEPAM 5 MG PO TABS   Oral   Take 5 mg by mouth at bedtime as needed. For sleep.           BP 185/81  Temp 98.3 F (36.8 C) (Oral)  Resp 22  SpO2 95%  Physical Exam  HENT:  Head:      Nursing notes reviewed.  Electronic medical record reviewed. VITAL SIGNS:   Filed Vitals:   12/05/12 0013  BP: 185/81  Temp: 98.3 F (36.8 C)  TempSrc: Oral  Resp: 22  SpO2: 95%   CONSTITUTIONAL: Awake, oriented, appears non-toxic HENT: 1 cm laceration to the bridge of the nose., normocephalic, oral mucosa pink and moist, airway patent. Nares patent without drainage. External ears normal. EYES: Conjunctiva clear on the right, inferior subconjunctival hemorrhage on the left. With some mild conjunctival injection. No scratch is seen with fluorescein staining. EOMI, PERRLA NECK: Trachea midline, non-tender, supple CARDIOVASCULAR: Normal heart rate, Normal rhythm, No murmurs, rubs, gallops PULMONARY/CHEST: Clear to auscultation, no rhonchi, wheezes, or rales. Symmetrical breath sounds. Non-tender. ABDOMINAL: Non-distended, soft, non-tender - no rebound or guarding.  BS normal. NEUROLOGIC: Non-focal, moving all four extremities, no gross sensory or motor deficits. EXTREMITIES: No clubbing, cyanosis, or edema SKIN: Warm, Dry, No erythema, No rash  ED Course  LACERATION REPAIR Date/Time: 12/05/2012 2:02 AM Performed by: Jones Skene Authorized by: Jones Skene Consent: Verbal consent obtained. Consent given by: patient Patient identity confirmed: verbally with patient Time out: Immediately prior to procedure a "time out" was called to verify the correct patient, procedure, equipment, support staff and site/side marked as required. Body area: head/neck Location details: nose Laceration length: 1 cm Foreign bodies: no foreign bodies Tendon involvement: none Nerve involvement:  none Vascular damage: no Anesthesia: local infiltration Local anesthetic: lidocaine 2% without epinephrine Patient sedated: no Irrigation solution: saline Irrigation method: syringe Amount of cleaning: standard Debridement: none Degree of undermining: none Wound skin closure material used: 7-0 Prolene. Number of sutures: 3 Technique: simple Approximation: close Approximation difficulty: simple Dressing: antibiotic ointment and 4x4 sterile gauze Patient tolerance: Patient tolerated the procedure well with no immediate complications.   (including critical  care time)  Labs Reviewed - No data to display Dg Nasal Bones  12/05/2012  *RADIOLOGY REPORT*  Clinical Data: Facial injury  NASAL BONES - 3+ VIEW  Comparison: None.  Findings: Nondisplaced nasal bone fractures are suggested bilaterally and there may be nondisplaced nasal process of the maxilla fractures as well.  Visualized paranasal sinuses are clear. Nasal septum midline.  No displaced orbital wall abnormality identified.  IMPRESSION: Bilateral nondisplaced nasal bone/nasal process of the maxilla fractures are suggested.   Original Report Authenticated By: Jearld Lesch, M.D.      1. Laceration of nose without complication   2. Blunt trauma of nose   3. Subconjunctival hemorrhage   4. Conjunctivitis of left eye   5. Nasal bone fracture       MDM  Maria Arnold is a 67 y.o. female presenting with a small laceration if she walked into a plate glass window-check for nasal bone fractures.  X-ray shows bilateral likely nondisplaced nasal bone fractures. Repair laceration and update tetanus. Patient is on Coumadin, I do not think with the wound hemostatic, we need to check her Coumadin level. Patient has a mild headache which she does not want medicine for I do not think this is representative of a intracranial hemorrhage at this time. Do not think any further labs or imaging is needed at this time.  Wound is repaired with 3  interrupted sutures, she tolerated the procedure well. Do not think this laceration and then fractures represent a true open fracture, do not think she needs antibiotics at this time. Patient will followup on Thursday evening of Friday morning with her primary care physician for suture removal. Patient is instructed carefully to return to the emergency department should any signs of infection of the wound develop or should she have any fevers, chills facial pain, worsening swelling or any other concerning symptoms. Answered patient's and her husband's questions and we discharged home stable and in good condition.           Jones Skene, MD 12/05/12 0981  Jones Skene, MD 12/05/12 1914

## 2012-12-05 NOTE — ED Notes (Signed)
Pt returned from X Ray.

## 2012-12-05 NOTE — ED Notes (Signed)
Pt received eye cream

## 2012-12-05 NOTE — ED Notes (Signed)
PT vision without glasses 20/50.  With glasses visual acuity is 20/30.

## 2012-12-05 NOTE — ED Notes (Addendum)
Pt on coumadin.  Was walking and ran into a glass wall in department store, glasses cut bridge of nose. Here for nose laceration. Bleeding controlled, bandaid in place. (Denies: epistaxis, fall, LOC, pain, dizziness, facial pain, eye pain, nose pain, HA, change in balance, visual changes or other sx). Mentions separate incident 2d ago where "somethng blew into my eye", causing L eye irritation, L eye noted to be pink, irritated & watery. Facial/nose trauma incident occurred in [place] at 1800 tonight, pt left hospital in [place] after deciding not to be seen there, flew home to be seen here, here with husband. Alert, NAD, calm, interactive, steady gait, resps e/u, speaking in clear complete sentences. no bruising, other markings or swelling noted. Lac not visualized. Report given to primary RN.

## 2012-12-06 ENCOUNTER — Encounter: Payer: Self-pay | Admitting: Internal Medicine

## 2012-12-06 ENCOUNTER — Telehealth: Payer: Self-pay | Admitting: Cardiology

## 2012-12-06 NOTE — Telephone Encounter (Signed)
Forwarded to Dr Shirlee Latch

## 2012-12-06 NOTE — Telephone Encounter (Signed)
New Problem:    Patient called in wanting to know the name of an ENT in the area.  Please call back.

## 2012-12-06 NOTE — Telephone Encounter (Signed)
[place] is good.

## 2012-12-07 ENCOUNTER — Ambulatory Visit: Payer: Medicare Other | Admitting: Cardiology

## 2012-12-07 NOTE — Telephone Encounter (Signed)
This information  was sent to pt in My Chart 12/06/12

## 2012-12-13 ENCOUNTER — Telehealth: Payer: Self-pay | Admitting: Internal Medicine

## 2012-12-13 NOTE — Telephone Encounter (Signed)
She will call if she needs to reschedule her appointment for Fri

## 2012-12-13 NOTE — Telephone Encounter (Signed)
Error

## 2012-12-13 NOTE — Telephone Encounter (Addendum)
PT HAVING NOSE SURGERY 12-18-12 NOT SURE WHAT TO DO ABOUT appt with allred on Friday, has question re going off coumadin

## 2012-12-14 ENCOUNTER — Encounter: Payer: Medicare Other | Admitting: Internal Medicine

## 2012-12-15 ENCOUNTER — Telehealth: Payer: Self-pay | Admitting: Internal Medicine

## 2012-12-15 NOTE — Telephone Encounter (Signed)
Pt broke her nose and had surgery today, is it ok to take tylenol? Also pt had coumadin done today 2-3 when will she need another coumadin ck, pls forward to coumadin

## 2012-12-15 NOTE — Telephone Encounter (Signed)
Ok take Tylenol.  INR= 2.3 today prior to surgery

## 2012-12-15 NOTE — Telephone Encounter (Signed)
Dr Jenel Lucks appointment rescheduled to Thurs at 4:15, when does she need to have INR checked again.  It was 2.3 today prior to surgery

## 2012-12-16 ENCOUNTER — Encounter: Payer: Medicare Other | Admitting: Internal Medicine

## 2012-12-20 NOTE — Telephone Encounter (Signed)
Pt called back, advised INR looked good at hospital on 12/15/12, advised pt to continue on same dosage and will need recheck in 4 weeks. Pt WCB to schedule.

## 2012-12-22 ENCOUNTER — Encounter: Payer: Medicare Other | Admitting: Internal Medicine

## 2012-12-23 ENCOUNTER — Ambulatory Visit: Payer: Medicare Other | Admitting: Cardiology

## 2012-12-24 ENCOUNTER — Other Ambulatory Visit: Payer: Self-pay

## 2012-12-27 ENCOUNTER — Other Ambulatory Visit: Payer: Self-pay | Admitting: Cardiology

## 2012-12-31 ENCOUNTER — Other Ambulatory Visit: Payer: Self-pay | Admitting: Cardiology

## 2013-01-03 ENCOUNTER — Telehealth: Payer: Self-pay | Admitting: Cardiology

## 2013-01-03 ENCOUNTER — Ambulatory Visit: Payer: Medicare Other | Admitting: Cardiology

## 2013-01-05 ENCOUNTER — Ambulatory Visit (INDEPENDENT_AMBULATORY_CARE_PROVIDER_SITE_OTHER): Payer: Medicare Other | Admitting: Internal Medicine

## 2013-01-05 ENCOUNTER — Ambulatory Visit (INDEPENDENT_AMBULATORY_CARE_PROVIDER_SITE_OTHER): Payer: Medicare Other | Admitting: *Deleted

## 2013-01-05 ENCOUNTER — Encounter: Payer: Self-pay | Admitting: Internal Medicine

## 2013-01-05 VITALS — BP 173/89 | HR 76 | Ht 62.0 in | Wt 158.8 lb

## 2013-01-05 DIAGNOSIS — Z7901 Long term (current) use of anticoagulants: Secondary | ICD-10-CM

## 2013-01-05 DIAGNOSIS — I4891 Unspecified atrial fibrillation: Secondary | ICD-10-CM

## 2013-01-05 DIAGNOSIS — I10 Essential (primary) hypertension: Secondary | ICD-10-CM

## 2013-01-05 LAB — POCT INR: INR: 3.2

## 2013-01-05 NOTE — Patient Instructions (Addendum)
Your physician recommends that you schedule a follow-up appointment in: 3 months with Dr Johney Frame   Nurse room visit in one week for BP check in one week   Let us know about Eliquis if you want to change to that from Warfarin  Stop Tikosyn STOP Protonix

## 2013-01-05 NOTE — Progress Notes (Signed)
PCP: MANNING, Adelene Amas., MD Primary Cardiologist:  Dr Laurell Josephs is a 67 y.o. female who presents today for routine electrophysiology followup.  Since her afib ablation, the patient reports doing very well.  Today, she denies symptoms of palpitations, chest pain, shortness of breath,  lower extremity edema, dizziness, presyncope, or syncope.  The patient is otherwise without complaint today.   Past Medical History  Diagnosis Date  . Cardiomyopathy     Nonischemic.  LHC (2/12): No coronary disease, EF 30-35% with periapical severe  hypokinesis.  Echo (2/12): EF 30-35%, periapical severe hypokinesis, normal RV, mild to moderate MR.  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   . Asthma   . HTN (hypertension)   . Foot pain     From bone spur  . Back pain     Low back  . PAF (paroxysmal atrial fibrillation)     with rapid ventricular response.  She is on coumadin and dofetilide.  She has had some breakthrough atrial fib on dofetilide.   Past Surgical History  Procedure Laterality Date  . Nasal sinus surgery    . Dilation and curettage of uterus    . Laparoscopic abdominal exploration    . Tee without cardioversion  09/21/2012    Procedure: TRANSESOPHAGEAL ECHOCARDIOGRAM (TEE);  Surgeon: Vesta Mixer, MD;  Location: [facility] ENDOSCOPY;  Service: Cardiovascular;  Laterality: N/A;    Current Outpatient Prescriptions  Medication Sig Dispense Refill  . Ascorbic Acid (VITAMIN C) 500 MG tablet Take 500 mg by mouth daily.        . Calcium-Magnesium-Zinc (CAL-MAG-ZINC PO) Take 1 tablet by mouth daily.       . carvedilol (COREG) 3.125 MG tablet Take 3.125 mg by mouth 2 (two) times daily with a meal.      . carvedilol (COREG) 3.125 MG tablet TAKE 1 TABLET BY MOUTH TWICE A DAY WITH A MEAL  60 tablet  3  . Cyanocobalamin (VITAMIN B-12 CR PO) Take 3 tablets by mouth daily.       . diazepam (VALIUM) 5 MG tablet Take  5 mg by mouth at bedtime as needed. For sleep.      Marland Kitchen dofetilide (TIKOSYN) 250 MCG capsule Take 1 capsule (250 mcg total) by mouth 2 (two) times daily.  60 capsule  11  . losartan (COZAAR) 25 MG tablet Take 25 mg by mouth every morning.      Marland Kitchen losartan (COZAAR) 25 MG tablet TAKE 1 TABLET EVERY DAY  30 tablet  0  . Multiple Vitamin (MULTIVITAMIN WITH MINERALS) TABS Take 1 tablet by mouth daily.      . pantoprazole (PROTONIX) 40 MG tablet Take 1 tablet (40 mg total) by mouth daily.  30 tablet  3  . warfarin (COUMADIN) 7.5 MG tablet Take 7.5 mg by mouth every evening.       No current facility-administered medications for this visit.    Physical Exam: Filed Vitals:   01/05/13 0951  BP: 173/89  Pulse: 76  Height: 5\' 2"  (1.575 m)  Weight: 158 lb 12.8 oz (72.031 kg)    GEN- The patient is well appearing, alert and oriented x 3 today.   Head- normocephalic, atraumatic Eyes-  Sclera clear, conjunctiva pink Ears- hearing intact Oropharynx- clear Lungs- Clear to ausculation bilaterally, normal work of breathing Heart- Regular rate and rhythm, no murmurs, rubs or gallops, PMI not laterally displaced GI- soft, NT, ND, + BS Extremities-  no clubbing, cyanosis, or edema  ekg today reveals sinus rhythm 78 bpm, normal ekg  Assessment and Plan: 1. afib Doing well s/p ablation Stop tikosyn Continue coumadin, she will consider eliquis as an alternative and contact me if she decides to change  2. HTN Above goal She would like to monitor her BP at home and return for nurse visit before making changes If BP remains elevated, would increase cozaar to 50mg  daily at that time  Return to see me in 3 months

## 2013-01-10 ENCOUNTER — Ambulatory Visit: Payer: Medicare Other | Admitting: Cardiology

## 2013-01-12 ENCOUNTER — Ambulatory Visit: Payer: Medicare Other | Admitting: Cardiology

## 2013-01-20 ENCOUNTER — Other Ambulatory Visit (HOSPITAL_COMMUNITY): Payer: Self-pay | Admitting: Cardiology

## 2013-02-01 ENCOUNTER — Ambulatory Visit: Payer: Medicare Other | Admitting: Cardiology

## 2013-02-04 ENCOUNTER — Other Ambulatory Visit: Payer: Self-pay | Admitting: Cardiology

## 2013-02-06 ENCOUNTER — Other Ambulatory Visit (HOSPITAL_COMMUNITY): Payer: Self-pay | Admitting: Cardiology

## 2013-02-06 ENCOUNTER — Other Ambulatory Visit: Payer: Self-pay | Admitting: Cardiology

## 2013-02-23 ENCOUNTER — Ambulatory Visit (INDEPENDENT_AMBULATORY_CARE_PROVIDER_SITE_OTHER): Payer: Medicare Other | Admitting: *Deleted

## 2013-02-23 ENCOUNTER — Ambulatory Visit: Payer: Medicare Other | Attending: Orthopaedic Surgery | Admitting: Physical Therapy

## 2013-02-23 DIAGNOSIS — I4891 Unspecified atrial fibrillation: Secondary | ICD-10-CM

## 2013-02-23 DIAGNOSIS — R293 Abnormal posture: Secondary | ICD-10-CM | POA: Insufficient documentation

## 2013-02-23 DIAGNOSIS — M25519 Pain in unspecified shoulder: Secondary | ICD-10-CM | POA: Insufficient documentation

## 2013-02-23 DIAGNOSIS — Z7901 Long term (current) use of anticoagulants: Secondary | ICD-10-CM

## 2013-02-23 DIAGNOSIS — IMO0001 Reserved for inherently not codable concepts without codable children: Secondary | ICD-10-CM | POA: Insufficient documentation

## 2013-02-23 LAB — POCT INR: INR: 1.9

## 2013-02-24 ENCOUNTER — Telehealth: Payer: Self-pay | Admitting: Cardiology

## 2013-02-24 NOTE — Telephone Encounter (Signed)
Spoke with pt, made aware that she can not use ibuprofen. She was told top use tylenol or muscle cream for her discomfort.

## 2013-02-24 NOTE — Telephone Encounter (Signed)
New Prob    Pt has some questions regarding coumadin. She has injured her back and wants to know if she can take anti-inflammatory ibuprofen for relief. Please call

## 2013-02-28 ENCOUNTER — Ambulatory Visit: Payer: Medicare Other | Admitting: Physical Therapy

## 2013-03-01 ENCOUNTER — Ambulatory Visit: Payer: Medicare Other | Admitting: Physical Therapy

## 2013-03-07 ENCOUNTER — Ambulatory Visit: Payer: Medicare Other | Admitting: Physical Therapy

## 2013-03-07 ENCOUNTER — Ambulatory Visit (INDEPENDENT_AMBULATORY_CARE_PROVIDER_SITE_OTHER): Payer: Medicare Other

## 2013-03-07 ENCOUNTER — Ambulatory Visit (INDEPENDENT_AMBULATORY_CARE_PROVIDER_SITE_OTHER): Payer: Medicare Other | Admitting: Cardiology

## 2013-03-07 ENCOUNTER — Encounter: Payer: Self-pay | Admitting: Cardiology

## 2013-03-07 VITALS — BP 128/88 | HR 76 | Ht 62.0 in | Wt 158.0 lb

## 2013-03-07 DIAGNOSIS — I5181 Takotsubo syndrome: Secondary | ICD-10-CM

## 2013-03-07 DIAGNOSIS — I4891 Unspecified atrial fibrillation: Secondary | ICD-10-CM

## 2013-03-07 DIAGNOSIS — Z7901 Long term (current) use of anticoagulants: Secondary | ICD-10-CM

## 2013-03-07 DIAGNOSIS — R0989 Other specified symptoms and signs involving the circulatory and respiratory systems: Secondary | ICD-10-CM

## 2013-03-07 DIAGNOSIS — I10 Essential (primary) hypertension: Secondary | ICD-10-CM

## 2013-03-07 LAB — CBC WITH DIFFERENTIAL/PLATELET
Basophils Absolute: 0 10*3/uL (ref 0.0–0.1)
Basophils Relative: 0.5 % (ref 0.0–3.0)
Eosinophils Absolute: 0.1 10*3/uL (ref 0.0–0.7)
Eosinophils Relative: 1 % (ref 0.0–5.0)
HCT: 44.7 % (ref 36.0–46.0)
Hemoglobin: 15.4 g/dL — ABNORMAL HIGH (ref 12.0–15.0)
Lymphocytes Relative: 26.7 % (ref 12.0–46.0)
Lymphs Abs: 1.7 10*3/uL (ref 0.7–4.0)
MCHC: 34.5 g/dL (ref 30.0–36.0)
MCV: 98.7 fl (ref 78.0–100.0)
Monocytes Absolute: 0.7 10*3/uL (ref 0.1–1.0)
Monocytes Relative: 10.3 % (ref 3.0–12.0)
Neutro Abs: 4 10*3/uL (ref 1.4–7.7)
Neutrophils Relative %: 61.5 % (ref 43.0–77.0)
Platelets: 284 10*3/uL (ref 150.0–400.0)
RBC: 4.53 Mil/uL (ref 3.87–5.11)
RDW: 13.4 % (ref 11.5–14.6)
WBC: 6.6 10*3/uL (ref 4.5–10.5)

## 2013-03-07 LAB — BASIC METABOLIC PANEL
BUN: 14 mg/dL (ref 6–23)
CO2: 27 mEq/L (ref 19–32)
Calcium: 9.9 mg/dL (ref 8.4–10.5)
Chloride: 101 mEq/L (ref 96–112)
Creatinine, Ser: 0.6 mg/dL (ref 0.4–1.2)
GFR: 117.2 mL/min (ref >60.00)
Glucose, Bld: 122 mg/dL — ABNORMAL HIGH (ref 70–99)
Potassium: 5.4 mEq/L — ABNORMAL HIGH (ref 3.5–5.1)
Sodium: 139 mEq/L (ref 135–145)

## 2013-03-07 LAB — POCT INR: INR: 1.9

## 2013-03-07 LAB — MAGNESIUM: Magnesium: 2 mg/dL (ref 1.5–2.5)

## 2013-03-07 NOTE — Patient Instructions (Addendum)
Your physician recommends that you have lab work today--BMET/Magnesium level/CBCd.  You may hold your coumadin(warfarin) for your foot surgery and eye surgery.  Your physician recommends that you schedule a follow-up appointment in: 3 months with Dr Shirlee Latch.

## 2013-03-08 ENCOUNTER — Telehealth: Payer: Self-pay | Admitting: Cardiology

## 2013-03-08 ENCOUNTER — Other Ambulatory Visit: Payer: Self-pay | Admitting: *Deleted

## 2013-03-08 ENCOUNTER — Ambulatory Visit: Payer: Medicare Other | Admitting: Physical Therapy

## 2013-03-08 DIAGNOSIS — I4891 Unspecified atrial fibrillation: Secondary | ICD-10-CM

## 2013-03-08 DIAGNOSIS — I5181 Takotsubo syndrome: Secondary | ICD-10-CM | POA: Insufficient documentation

## 2013-03-08 NOTE — Telephone Encounter (Signed)
New Prob     Pt returning phone call from earlier regarding blood work.

## 2013-03-08 NOTE — Progress Notes (Signed)
Patient ID: Maria Arnold, female   DOB: August 26, 1946, 67 y.o.   MRN: [id number] 67 y.o with hospital admission in 2/12 for atrial fibrillation and CHF presents for followup.  For about a week prior to admission, she had had congestion and shortness of breath. She thought she had a bad cold. She was seen by her PCP and ECG showed atrial fibrillation with RVR. She was admitted to [facility] for evaluation. CXR did not show PNA. She had wheezing consistent with a prior asthma diagnosis. She was then noted by echo to have EF 30-35% with peri-apical severe hypokinesis. LHC was done, there was no angiographic coronary disease. 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 She had suffered an emotional shock about a week prior to admission when her daughter-in-law had a miscarriage.   She underwent TEE-guided cardioversion, but this failed to convert her to NSR. She was then started on Tikosyn, which did convert her to NSR but her QTc prolonged in the setting of using moxifloxacin for acute bronchitis treatment. Moxifloxacin was stopped and the dofetilide dose was cut back to 250 micrograms two times a day. At this dose, QTc was normal.  Repeat echo done in 7/12 showed EF improved to 55-60% with normal RV and valves.  She underwent atrial fibrillation ablation in 11/13.  She has done well since then and has not had any recent tachypalpitations.  Dr. Johney Frame told her at last appointment that she could stop her Tikosyn but she has been nervous about stopping it and has continued to take it.  She has not had any exertional dyspnea or chest pain.  Today while talking about stopping Tikosyn, she actually went back into atrial fibrillation briefly.  She converted back to NSR quickly.    Maria Arnold: eye surgery for narrow angle glaucoma and foot surgery for a fracture and bone spur (painful) on her left foot.   ECG: atrial fibrillation  with normal QT interval Repeat ECG: NSR with normal QT interval  Labs (2/12): K 4.8, creatinine 0.67  Labs (3/12): K 4.2, creatinine 0.5  Labs (11/13): K 3.8, creatinine 0.5  Allergies (verified):  No Known Drug Allergies   Past Medical History:  1. Paroxysmal atrial fibrillation with rapid ventricular response. She is on coumadin and dofetilide. She has had some breakthrough atrial fib on dofetilide. Atrial fibrillation ablation in 11/13. 2. Cardiomyopathy: Nonischemic. LHC (2/12): No coronary disease, EF 30-35% with periapical severe hypokinesis. Echo (2/12): EF 30-35%, periapical severe hypokinesis, normal RV, mild to moderate MR. 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  Repeat echo in 7/12 showed EF 55-60%, mild biatrial enlargement, normal RV, normal valves.  3. Asthma  4. HTN  5. Foot pain from bone spur  6. Low back pain  7. Narrow angle glaucoma 8. Nasal bone fracture s/p ENT surgery  Family History:  No premature CAD   Social History:  Lives in [place] with husband. 2 children, 3 grandchildren. Nonsmoker, occasional ETOH.   Review of Systems  All systems reviewed and negative except as per HPI.   Current Outpatient Prescriptions  Medication Sig Dispense Refill  . Ascorbic Acid (VITAMIN C) 500 MG tablet Take 500 mg by mouth daily.        . Calcium-Magnesium-Zinc (CAL-MAG-ZINC PO) Take 1 tablet by mouth daily.       . carvedilol (COREG) 3.125 MG tablet TAKE 1 TABLET  BY MOUTH TWICE A DAY WITH A MEAL  60 tablet  3  . Cyanocobalamin (VITAMIN B-12 CR PO) Take 3 tablets by mouth daily.       . diazepam (VALIUM) 5 MG tablet Take 5 mg by mouth at bedtime as needed. For sleep.      Marland Kitchen losartan (COZAAR) 25 MG tablet TAKE 1 TABLET EVERY DAY  30 tablet  3  . Multiple Vitamin (MULTIVITAMIN WITH MINERALS) TABS Take 1 tablet by mouth daily.      Marland Kitchen warfarin (COUMADIN) 7.5 MG tablet Take 7.5 mg by mouth  every evening.      . dofetilide (TIKOSYN) 250 MCG capsule Take 1 capsule (250 mcg total) by mouth 2 (two) times daily.       No current facility-administered medications for this visit.    BP 128/88  Pulse 76  Ht 5\' 2"  (1.575 m)  Wt 158 lb (71.668 kg)  BMI 28.89 kg/m2  SpO2 97% General: NAD Neck: No JVD, no thyromegaly or thyroid nodule.  Lungs: Clear to auscultation bilaterally with normal respiratory effort. CV: Nondisplaced PMI.  Heart regular S1/S2, no S3/S4, no murmur.  No peripheral edema.  No carotid bruit.  Normal pedal pulses.  Abdomen: Soft, nontender, no hepatosplenomegaly, no distention.  Neurologic: Alert and oriented x 3.  Psych: Normal affect. Extremities: No clubbing or cyanosis.   Assessment/Plan: 1. Takotsubo cardiomyopathy: Resolved, EF normal on last echo in 2012.  Would continue Coreg and losartan.  2. Atrial fibrillation: Paroxysmal.  She has been doing well on Tikosyn.  She actually went into atrial fibrillation briefly when we talked about stopping Tikosyn today.  She was very anxious.  We also talked about switching over to apixaban, which makes her anxious also.  She went back into sinus rhythm quickly.   - I am going to have her continue Tikosyn until she gets through her foot surgery and her eye surgery.  These will be done in the next few weeks. After the surgeries, I think she can try going off Tikosyn. Her QT interval was not prolonged today.  I will get BMET and Mg today.  - She will continue coumadin until her surgeries are completed (can hold coumadin for surgery if needed).  When I see her back in 3 months, I will switch her over to Eliquis for long-term anticoagulation. Check CBC today.  3. HTN: BP is under reasonable control on current regimen.   Marca Ancona 03/08/2013

## 2013-03-08 NOTE — Telephone Encounter (Signed)
New problem:    Returning call back to nurse from yesterday  

## 2013-03-08 NOTE — Telephone Encounter (Signed)
Pt notified of potentially hemolyzed lab specimen and Dr. Alford Highland request to have tests repeated.  Order put in EPIC.

## 2013-03-08 NOTE — Telephone Encounter (Signed)
Pt here for repeat lab draw.

## 2013-03-08 NOTE — Addendum Note (Signed)
Addended by: Williemae Area on: 03/08/2013 05:02 PM   Modules accepted: Orders

## 2013-03-09 LAB — BASIC METABOLIC PANEL
BUN: 20 mg/dL (ref 6–23)
CO2: 30 mEq/L (ref 19–32)
Calcium: 9.1 mg/dL (ref 8.4–10.5)
Chloride: 102 mEq/L (ref 96–112)
Creatinine, Ser: 0.7 mg/dL (ref 0.4–1.2)
GFR: 91.74 mL/min (ref >60.00)
Glucose, Bld: 77 mg/dL (ref 70–99)
Potassium: 4.1 mEq/L (ref 3.5–5.1)
Sodium: 137 mEq/L (ref 135–145)

## 2013-03-13 ENCOUNTER — Ambulatory Visit: Payer: Medicare Other | Admitting: Physical Therapy

## 2013-03-15 ENCOUNTER — Ambulatory Visit: Payer: Medicare Other | Attending: Orthopaedic Surgery | Admitting: Physical Therapy

## 2013-03-15 DIAGNOSIS — R293 Abnormal posture: Secondary | ICD-10-CM | POA: Insufficient documentation

## 2013-03-15 DIAGNOSIS — IMO0001 Reserved for inherently not codable concepts without codable children: Secondary | ICD-10-CM | POA: Insufficient documentation

## 2013-03-15 DIAGNOSIS — M25519 Pain in unspecified shoulder: Secondary | ICD-10-CM | POA: Insufficient documentation

## 2013-04-12 ENCOUNTER — Ambulatory Visit (INDEPENDENT_AMBULATORY_CARE_PROVIDER_SITE_OTHER): Payer: Medicare Other | Admitting: *Deleted

## 2013-04-12 ENCOUNTER — Ambulatory Visit: Payer: Medicare Other | Admitting: Cardiology

## 2013-04-12 DIAGNOSIS — Z7901 Long term (current) use of anticoagulants: Secondary | ICD-10-CM

## 2013-04-12 DIAGNOSIS — I4891 Unspecified atrial fibrillation: Secondary | ICD-10-CM

## 2013-04-12 LAB — POCT INR: INR: 2

## 2013-05-01 ENCOUNTER — Other Ambulatory Visit: Payer: Self-pay | Admitting: Cardiology

## 2013-05-04 ENCOUNTER — Ambulatory Visit (INDEPENDENT_AMBULATORY_CARE_PROVIDER_SITE_OTHER): Payer: Medicare Other | Admitting: *Deleted

## 2013-05-04 DIAGNOSIS — I4891 Unspecified atrial fibrillation: Secondary | ICD-10-CM

## 2013-05-04 DIAGNOSIS — Z7901 Long term (current) use of anticoagulants: Secondary | ICD-10-CM

## 2013-05-04 LAB — POCT INR: INR: 2.2

## 2013-05-15 ENCOUNTER — Telehealth: Payer: Self-pay | Admitting: Cardiology

## 2013-05-15 NOTE — Telephone Encounter (Signed)
Pt aware of Dr Alford Highland recommendation.

## 2013-05-15 NOTE — Telephone Encounter (Signed)
Returned call to patient she stated she is having hair loss.Stated she wanted to ask Dr.McLean if she can take Biotin or Keratin Booster.Patient was told Dr.McLean out of office this week, will send message to him for advice.

## 2013-05-15 NOTE — Telephone Encounter (Signed)
New problem  Pt wants to know if there is a certain type of multi-vitamin she can take that will not interfere with her medication.

## 2013-05-15 NOTE — Telephone Encounter (Signed)
I think that either should be ok

## 2013-06-07 ENCOUNTER — Ambulatory Visit: Payer: Medicare Other | Admitting: Cardiology

## 2013-06-14 ENCOUNTER — Other Ambulatory Visit: Payer: Self-pay

## 2013-06-16 ENCOUNTER — Other Ambulatory Visit: Payer: Self-pay | Admitting: Cardiology

## 2013-06-20 ENCOUNTER — Telehealth: Payer: Self-pay | Admitting: *Deleted

## 2013-06-20 ENCOUNTER — Other Ambulatory Visit: Payer: Self-pay | Admitting: Pharmacist

## 2013-06-20 MED ORDER — WARFARIN SODIUM 7.5 MG PO TABS: 7.5000 mg | ORAL_TABLET | Freq: Every evening | ORAL | Status: DC

## 2013-06-20 NOTE — Telephone Encounter (Signed)
Left message for pt to call as has not been seen in clinic since  June 26th and we do have request to refill her coumadin.

## 2013-06-21 ENCOUNTER — Other Ambulatory Visit: Payer: Self-pay | Admitting: Internal Medicine

## 2013-06-30 ENCOUNTER — Ambulatory Visit (INDEPENDENT_AMBULATORY_CARE_PROVIDER_SITE_OTHER): Payer: Medicare Other | Admitting: *Deleted

## 2013-06-30 DIAGNOSIS — Z7901 Long term (current) use of anticoagulants: Secondary | ICD-10-CM

## 2013-06-30 DIAGNOSIS — I4891 Unspecified atrial fibrillation: Secondary | ICD-10-CM

## 2013-06-30 LAB — POCT INR: INR: 2.5

## 2013-07-19 IMAGING — CR DG NASAL BONES 3+V
3 series · 3 of 3 positions shown · non-contrast
Comparison: None.

CLINICAL DATA: Facial injury

NASAL BONES - 3+ VIEW

[w waters *]
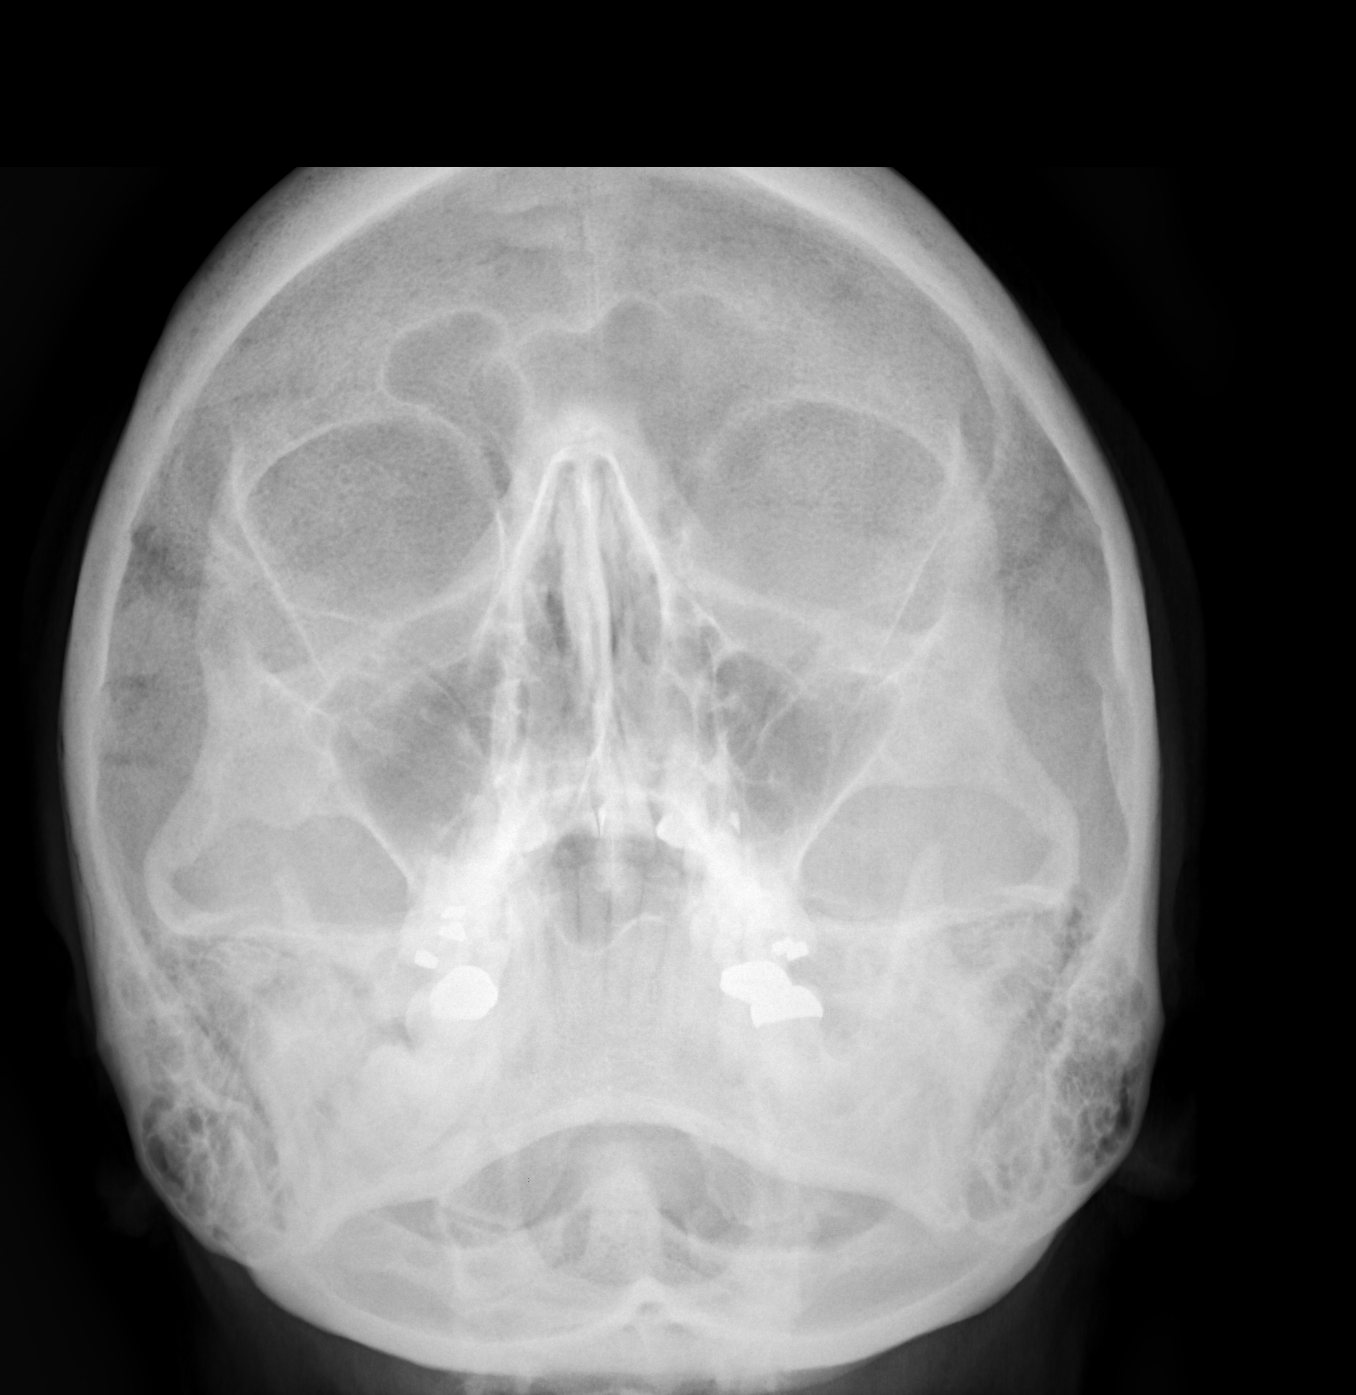

[w nasal bone lat * (1 of 2)]
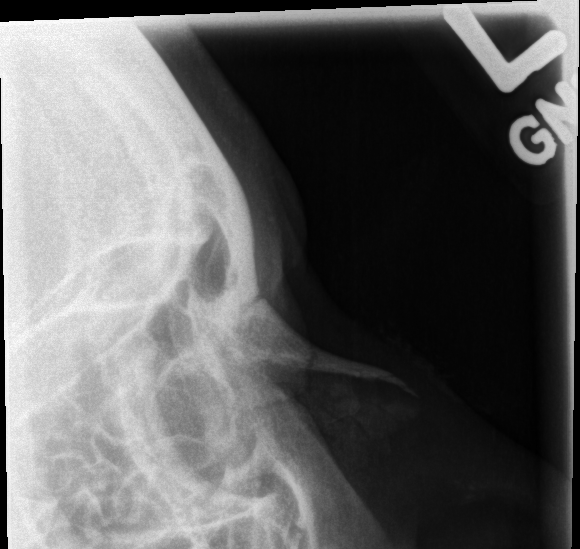

[w nasal bone lat * (2 of 2)]
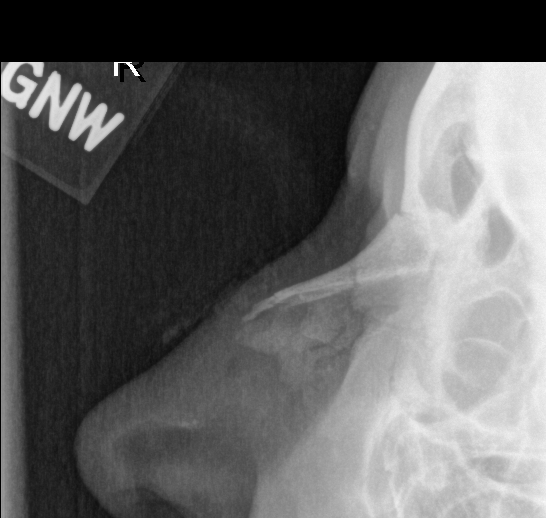

[3 of 3 positions shown; findings below may reference images not displayed]

FINDINGS: Nondisplaced nasal bone fractures are suggested
bilaterally and there may be nondisplaced nasal process of the
maxilla fractures as well.  Visualized paranasal sinuses are clear.
Nasal septum midline.  No displaced orbital wall abnormality
identified.
IMPRESSION: Bilateral nondisplaced nasal bone/nasal process of the maxilla
fractures are suggested.

## 2013-08-05 ENCOUNTER — Other Ambulatory Visit: Payer: Self-pay | Admitting: Cardiology

## 2013-08-22 ENCOUNTER — Other Ambulatory Visit: Payer: Self-pay | Admitting: Cardiology

## 2013-08-25 ENCOUNTER — Other Ambulatory Visit: Payer: Self-pay | Admitting: Cardiology

## 2013-08-31 ENCOUNTER — Other Ambulatory Visit (HOSPITAL_COMMUNITY): Payer: Self-pay | Admitting: Cardiology

## 2013-09-07 ENCOUNTER — Other Ambulatory Visit: Payer: Self-pay | Admitting: Cardiology

## 2013-09-14 ENCOUNTER — Other Ambulatory Visit: Payer: Self-pay

## 2013-09-14 ENCOUNTER — Other Ambulatory Visit: Payer: Self-pay | Admitting: Cardiology

## 2013-09-15 NOTE — Telephone Encounter (Signed)
Follow up    Out of town----need enough coumadin to last her until Tuesday---[facility] [facility] [place].  She will make an appt for tues.

## 2013-09-19 ENCOUNTER — Ambulatory Visit (INDEPENDENT_AMBULATORY_CARE_PROVIDER_SITE_OTHER): Payer: Medicare Other | Admitting: General Practice

## 2013-09-19 DIAGNOSIS — I4891 Unspecified atrial fibrillation: Secondary | ICD-10-CM

## 2013-09-19 DIAGNOSIS — Z7901 Long term (current) use of anticoagulants: Secondary | ICD-10-CM

## 2013-09-19 LAB — POCT INR: INR: 2.8

## 2013-09-19 MED ORDER — WARFARIN SODIUM 7.5 MG PO TABS: ORAL_TABLET | ORAL | Status: DC

## 2013-09-27 ENCOUNTER — Other Ambulatory Visit: Payer: Self-pay | Admitting: Cardiology

## 2013-10-11 ENCOUNTER — Other Ambulatory Visit: Payer: Self-pay | Admitting: Cardiology

## 2013-10-19 ENCOUNTER — Other Ambulatory Visit: Payer: Self-pay | Admitting: Internal Medicine

## 2013-10-20 ENCOUNTER — Telehealth: Payer: Self-pay

## 2013-10-20 ENCOUNTER — Other Ambulatory Visit: Payer: Self-pay

## 2013-10-20 ENCOUNTER — Telehealth: Payer: Self-pay | Admitting: *Deleted

## 2013-10-20 DIAGNOSIS — I10 Essential (primary) hypertension: Secondary | ICD-10-CM

## 2013-10-20 MED ORDER — DOXYCYCLINE HYCLATE 100 MG PO TABS: ORAL_TABLET | ORAL | Status: DC

## 2013-10-20 MED ORDER — DOFETILIDE 250 MCG PO CAPS: ORAL_CAPSULE | ORAL | Status: DC

## 2013-10-20 NOTE — Telephone Encounter (Signed)
Patient called having trouble getting her tikosyn filled, Selena Batten and I called around to different pharmacy to find the tikosyn. We found it at [facility] in [place] . So I sent in a rx and called the patient back

## 2013-10-20 NOTE — Telephone Encounter (Signed)
Ok but come for labs prior

## 2013-10-20 NOTE — Telephone Encounter (Signed)
Returned call to patient Dr.Mclean prescribed Doxycycline 100 mg twice a day for 5 days.Advised needs appointment soon.Appointment scheduled with Dr.Mclean 10/25/13 at 11:30 am,cbc,bmet,magnesium and ekg to be done.

## 2013-10-20 NOTE — Telephone Encounter (Signed)
She can take doxycycline 100 mg bid x 5 days.  She does need to be seen in the office soon (can overbook onto my schedule) with CBC/BMET/magnesium level and ECG.

## 2013-10-20 NOTE — Telephone Encounter (Signed)
Pharmacy called stating that eliquis came in a quantity of 60, and that if he filled it as requested he would have to discard the remaining amount. I told him that was ok.

## 2013-10-23 ENCOUNTER — Ambulatory Visit: Payer: Medicare Other | Admitting: Cardiology

## 2013-10-25 ENCOUNTER — Ambulatory Visit (INDEPENDENT_AMBULATORY_CARE_PROVIDER_SITE_OTHER): Payer: Medicare Other | Admitting: Pharmacist

## 2013-10-25 ENCOUNTER — Ambulatory Visit (INDEPENDENT_AMBULATORY_CARE_PROVIDER_SITE_OTHER): Payer: Medicare Other | Admitting: Cardiology

## 2013-10-25 ENCOUNTER — Encounter: Payer: Self-pay | Admitting: Cardiology

## 2013-10-25 VITALS — BP 130/78 | HR 67 | Ht 62.0 in | Wt 154.0 lb

## 2013-10-25 DIAGNOSIS — I5181 Takotsubo syndrome: Secondary | ICD-10-CM

## 2013-10-25 DIAGNOSIS — I4891 Unspecified atrial fibrillation: Secondary | ICD-10-CM

## 2013-10-25 DIAGNOSIS — I10 Essential (primary) hypertension: Secondary | ICD-10-CM

## 2013-10-25 DIAGNOSIS — Z23 Encounter for immunization: Secondary | ICD-10-CM

## 2013-10-25 DIAGNOSIS — Z7901 Long term (current) use of anticoagulants: Secondary | ICD-10-CM

## 2013-10-25 LAB — CBC WITH DIFFERENTIAL/PLATELET
Basophils Absolute: 0 10*3/uL (ref 0.0–0.1)
Basophils Relative: 0.3 % (ref 0.0–3.0)
Eosinophils Absolute: 0.1 10*3/uL (ref 0.0–0.7)
Eosinophils Relative: 0.8 % (ref 0.0–5.0)
HCT: 42 % (ref 36.0–46.0)
Hemoglobin: 14.5 g/dL (ref 12.0–15.0)
Lymphocytes Relative: 22.7 % (ref 12.0–46.0)
Lymphs Abs: 1.6 10*3/uL (ref 0.7–4.0)
MCHC: 34.6 g/dL (ref 30.0–36.0)
MCV: 99.5 fl (ref 78.0–100.0)
Monocytes Absolute: 0.6 10*3/uL (ref 0.1–1.0)
Monocytes Relative: 8.2 % (ref 3.0–12.0)
Neutro Abs: 4.7 10*3/uL (ref 1.4–7.7)
Neutrophils Relative %: 68 % (ref 43.0–77.0)
Platelets: 282 10*3/uL (ref 150.0–400.0)
RBC: 4.22 Mil/uL (ref 3.87–5.11)
RDW: 12.5 % (ref 11.5–14.6)
WBC: 7 10*3/uL (ref 4.5–10.5)

## 2013-10-25 LAB — BASIC METABOLIC PANEL
BUN: 13 mg/dL (ref 6–23)
CO2: 31 mEq/L (ref 19–32)
Calcium: 9.1 mg/dL (ref 8.4–10.5)
Chloride: 105 mEq/L (ref 96–112)
Creatinine, Ser: 0.6 mg/dL (ref 0.4–1.2)
GFR: 116.97 mL/min (ref >60.00)
Glucose, Bld: 117 mg/dL — ABNORMAL HIGH (ref 70–99)
Potassium: 4.1 mEq/L (ref 3.5–5.1)
Sodium: 143 mEq/L (ref 135–145)

## 2013-10-25 LAB — MAGNESIUM: Magnesium: 2.1 mg/dL (ref 1.5–2.5)

## 2013-10-25 LAB — POCT INR: INR: 1.7

## 2013-10-25 LAB — TSH: TSH: 1.99 u[IU]/mL (ref 0.35–5.50)

## 2013-10-25 MED ORDER — APIXABAN 5 MG PO TABS: 5.0000 mg | ORAL_TABLET | Freq: Two times a day (BID) | ORAL | Status: DC

## 2013-10-25 NOTE — Progress Notes (Signed)
Patient ID: Maria Arnold, female   DOB: 04-13-1946, 67 y.o.   MRN: [id number] 67 yo with hospital admission in 2/12 for atrial fibrillation and CHF presents for followup.  For about a week prior to admission, she had had congestion and shortness of breath. She thought she had a bad cold. She was seen by her PCP and ECG showed atrial fibrillation with RVR. She was admitted to [facility] for evaluation. CXR did not show PNA. She had wheezing consistent with a prior asthma diagnosis. She was then noted by echo to have EF 30-35% with peri-apical severe hypokinesis. LHC was done, there was no angiographic coronary disease. 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 She had suffered an emotional shock about a week prior to admission when her daughter-in-law had a miscarriage.   She underwent TEE-guided cardioversion, but this failed to convert her to NSR. She was then started on Tikosyn, which did convert her to NSR but her QTc prolonged in the setting of using moxifloxacin for acute bronchitis treatment. Moxifloxacin was stopped and the dofetilide dose was cut back to 250 micrograms two times a day. At this dose, QTc was normal.  Repeat echo done in 7/12 showed EF improved to 55-60% with normal RV and valves.  She underwent atrial fibrillation ablation in 11/13.  In 4/14, we talked about stopping Tikosyn and she got very nervous.  She actually went into atrial fibrillation in the office so I did not stop Tikosyn.     Patient is doing well overall.  She does, however, need 2 eye surgeries and a foot surgery.  No exertional dyspnea or chest pain.  No orthopnea/PND.    ECG: NSR, QTc 424  Labs (2/12): K 4.8, creatinine 0.67  Labs (3/12): K 4.2, creatinine 0.5  Labs (11/13): K 3.8, creatinine 0.5 Labs (4/14): K 4.1, creatinine 0.7, HCT 44.7  Allergies (verified):  No Known Drug Allergies   Past Medical History:  1. Paroxysmal atrial fibrillation with  rapid ventricular response. She is on coumadin and dofetilide. She has had some breakthrough atrial fib on dofetilide. Atrial fibrillation ablation in 11/13. 2. Cardiomyopathy: Nonischemic. LHC (2/12): No coronary disease, EF 30-35% with periapical severe hypokinesis. Echo (2/12): EF 30-35%, periapical severe hypokinesis, normal RV, mild to moderate MR. 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  Repeat echo in 7/12 showed EF 55-60%, mild biatrial enlargement, normal RV, normal valves.  3. Asthma  4. HTN  5. Foot pain from bone spur  6. Low back pain  7. Narrow angle glaucoma 8. Nasal bone fracture s/p ENT surgery  Family History:  No premature CAD   Social History:  Lives in [place] with husband. 2 children, 4 grandchildren. Nonsmoker, occasional ETOH.   Review of Systems  All systems reviewed and negative except as per HPI.   Current Outpatient Prescriptions  Medication Sig Dispense Refill  . Ascorbic Acid (VITAMIN C) 500 MG tablet Take 500 mg by mouth daily.        . Calcium-Magnesium-Zinc (CAL-MAG-ZINC PO) Take 1 tablet by mouth daily.       . carvedilol (COREG) 3.125 MG tablet TAKE 1 TABLET BY MOUTH TWICE A DAY WITH MEAL  60 tablet  0  . Cyanocobalamin (VITAMIN B-12 CR PO) Take 3 tablets by mouth daily.       . diazepam (VALIUM) 5 MG tablet Take 5 mg by mouth at bedtime as needed. For  sleep.      . dofetilide (TIKOSYN) 250 MCG capsule TAKE ONE CAPSULE BY MOUTH TWICE A DAY  38 capsule  0  . doxycycline (VIBRA-TABS) 100 MG tablet Take one tablet by mouth two times a day for 5 days  10 tablet  0  . losartan (COZAAR) 25 MG tablet TAKE 1 TABLET BY MOUTH EVERY DAY  30 tablet  0  . Multiple Vitamin (MULTIVITAMIN WITH MINERALS) TABS Take 1 tablet by mouth daily.      Marland Kitchen apixaban (ELIQUIS) 5 MG TABS tablet Take 1 tablet (5 mg total) by mouth 2 (two) times daily.  60 tablet  11   No current  facility-administered medications for this visit.    BP 130/78  Pulse 67  Ht 5\' 2"  (1.575 m)  Wt 69.854 kg (154 lb)  BMI 28.16 kg/m2 General: NAD Neck: No JVD, no thyromegaly or thyroid nodule.  Lungs: Clear to auscultation bilaterally with normal respiratory effort. CV: Nondisplaced PMI.  Heart regular S1/S2, no S3/S4, no murmur.  No peripheral edema.  No carotid bruit.  Normal pedal pulses.  Abdomen: Soft, nontender, no hepatosplenomegaly, no distention.  Neurologic: Alert and oriented x 3.  Psych: Normal affect. Extremities: No clubbing or cyanosis.   Assessment/Plan: 1. Takotsubo cardiomyopathy: Resolved, EF normal on last echo in 2012.  Would continue Coreg and losartan.  2. Atrial fibrillation: Paroxysmal.  She has been doing well on Tikosyn.  She actually went into atrial fibrillation briefly when we talked about stopping Tikosyn in 67/14.   - She really wants to go off Tikosyn.  I will, therefore, have her stop it after her eye surgeries (early January).  If she goes back into fibrillation, would have her see Dr Johney Frame for consideration of redo ablation.  - Given Tikosyn use, she will need BMET and Mg today.   QT interval ok today on ECG.   - INR is 1.7 today.  I will have her stop coumadin and start Eliquis 5 mg bid.   3. HTN: BP is under reasonable control on current regimen.   Followup in 3 months.   Marca Ancona 10/25/2013

## 2013-10-25 NOTE — Patient Instructions (Signed)
Stop warfarin (coumadin).   Start Eliquis 5mg  two times a day. Start taking it tonight.   Stop Tikosyn on November 28, 2013.   Your physician recommends that you return for lab work today--TSH/BMET/Magnesium level/CBCd.  Your physician recommends that you schedule a follow-up appointment in: 3 months with Dr Shirlee Latch.

## 2013-10-27 ENCOUNTER — Other Ambulatory Visit: Payer: Self-pay | Admitting: Cardiology

## 2013-11-07 ENCOUNTER — Other Ambulatory Visit: Payer: Self-pay | Admitting: Cardiology

## 2013-11-20 ENCOUNTER — Telehealth: Payer: Self-pay | Admitting: Physician Assistant

## 2013-11-20 MED ORDER — DOFETILIDE 250 MCG PO CAPS: 250.0000 ug | ORAL_CAPSULE | Freq: Two times a day (BID) | ORAL | Status: DC

## 2013-11-20 NOTE — Telephone Encounter (Addendum)
Pt called answering service regarding med refill. She is on Tikosyn 232mcg BID chronically. Last labs and OV 10/2013 were acceptable. Dr. Aundra Dubin has wanted to keep her on this. She had it filled at [facility] in December and [facility] past [facility] but was told she would need refills. She states this was faxed to our office but it is now after hours so I do not have access to that. She is now in [place] and requests med refill as she is due this med at [place]. [facility] that [facility] [address] [facility] in [facility]. I have sent in a 30 day rx for this locally there. (Initially accidentally sent in 30 tablets - I discontinued this order and re-prescribed for 60 tablets. [facility] to [facility] this [facility].) Await further refill authorization from our office to [facility] [place] [facility]. Will forward to Dr. Aundra Dubin and his nurse as Juluis Rainier. Raziyah Vanvleck PA-C

## 2013-11-24 ENCOUNTER — Ambulatory Visit: Payer: Medicare Other | Admitting: Cardiology

## 2013-11-29 ENCOUNTER — Other Ambulatory Visit: Payer: Self-pay | Admitting: Cardiology

## 2013-11-29 ENCOUNTER — Other Ambulatory Visit: Payer: Self-pay | Admitting: Physician Assistant

## 2013-12-01 ENCOUNTER — Telehealth: Payer: Self-pay | Admitting: *Deleted

## 2013-12-01 NOTE — Telephone Encounter (Signed)
Patient requests coumadin refill. She has not started eliquis yet, she is waiting until after her eye surgery in March. Sent note to Dr Aundra Dubin to make him aware of this since he had told her to stop it at her last office visit. Thanks, MI

## 2013-12-01 NOTE — Telephone Encounter (Signed)
Pt is overdue for f/u.  Pt was supposed to be off Coumadin and on Eliquis.  Pt was 1.7 on 10/25/13, needs ov for refill.  LMOM TCB.

## 2013-12-04 ENCOUNTER — Other Ambulatory Visit: Payer: Self-pay | Admitting: *Deleted

## 2013-12-04 MED ORDER — WARFARIN SODIUM 7.5 MG PO TABS: ORAL_TABLET | ORAL | Status: DC

## 2013-12-04 MED ORDER — DOFETILIDE 250 MCG PO CAPS: 250.0000 ug | ORAL_CAPSULE | Freq: Two times a day (BID) | ORAL | Status: DC

## 2013-12-04 NOTE — Addendum Note (Signed)
Addended by: Fernande Boyden on: 12/04/2013 09:02 AM   Modules accepted: Orders

## 2013-12-04 NOTE — Telephone Encounter (Signed)
Refill sent in by patient care advocate team.  [facility] for appt.  Will still need INR ASAP as it has been ~6 weeks since last check and was subtherapeutic at that time.

## 2013-12-18 NOTE — Telephone Encounter (Signed)
Have left multiple messages for pt to call and make appt.  None made at this point.  Will await for pt to follow up at this time.

## 2013-12-27 ENCOUNTER — Telehealth: Payer: Self-pay | Admitting: Cardiology

## 2013-12-27 MED ORDER — DOXYCYCLINE HYCLATE 100 MG PO TABS: ORAL_TABLET | ORAL | Status: DC

## 2013-12-27 NOTE — Telephone Encounter (Signed)
Dr Maria Arnold advised pt to go to Urgent Care to be tested/treated for flu. Pt does not think she has the flu and did not want to go to Urgent Care. Pt aware she has 48 hours from the start of symptoms to be tested/treated.

## 2013-12-27 NOTE — Telephone Encounter (Signed)
New message          Pt would like an antibiotic for bronchitis.

## 2013-12-27 NOTE — Telephone Encounter (Signed)
New message    patient calling back due to symptoms has changes . Fever 100.0   . Wants to know is there anything safe to take for her fever. pro time on Friday.

## 2013-12-27 NOTE — Telephone Encounter (Signed)
Dr Aundra Dubin will prescribe Doxycycline 100mg  bid X 5 days. Pt advised.

## 2013-12-29 ENCOUNTER — Ambulatory Visit (INDEPENDENT_AMBULATORY_CARE_PROVIDER_SITE_OTHER): Payer: Medicare Other

## 2013-12-29 DIAGNOSIS — Z7901 Long term (current) use of anticoagulants: Secondary | ICD-10-CM

## 2013-12-29 DIAGNOSIS — I4891 Unspecified atrial fibrillation: Secondary | ICD-10-CM

## 2013-12-29 DIAGNOSIS — Z5181 Encounter for therapeutic drug level monitoring: Secondary | ICD-10-CM | POA: Insufficient documentation

## 2013-12-29 LAB — POCT INR: INR: 1.3

## 2014-01-01 ENCOUNTER — Telehealth: Payer: Self-pay | Admitting: Cardiology

## 2014-01-01 MED ORDER — AMOXICILLIN-POT CLAVULANATE 500-125 MG PO TABS: 500.0000 mg | ORAL_TABLET | Freq: Two times a day (BID) | ORAL | Status: DC

## 2014-01-01 NOTE — Telephone Encounter (Signed)
What are her symptoms? If she continues to be symptomatic, has fever, has cough, etc she needs to go to an urgent care and get CXR and flu test.

## 2014-01-01 NOTE — Telephone Encounter (Signed)
Pt advised.

## 2014-01-01 NOTE — Telephone Encounter (Signed)
New message  Pt states she was given medication for Bronchitis// She states that she is in need of something stronger// Her primary care moved to [place] and she is now on a waiting list. She states that Dr. Aundra Dubin is assisting her with her med's until she is picked up by a new physician// She is out of her medication and will need more antibiotics but something stronger. She states she will need this today// Please assist

## 2014-01-01 NOTE — Telephone Encounter (Signed)
Pt states she is better but not completely well. She does not think she has the flu, she thinks it is asthmatic bronchitis. She thinks if she had a few more days of a different antibiotic that would take care of it. She has a speech on Thursday and is trying to get better by then so she does not have to get a substitute. She is requesting augmentin 500mg  bid for 5 days. I will forward to Dr Aundra Dubin for review.

## 2014-01-01 NOTE — Telephone Encounter (Signed)
That would be ok, but given her 7 days.

## 2014-01-01 NOTE — Telephone Encounter (Signed)
Will forward to Dr McLean 

## 2014-01-01 NOTE — Telephone Encounter (Signed)
Follow up ° ° ° ° °Returning a nurses call °

## 2014-01-10 ENCOUNTER — Other Ambulatory Visit (HOSPITAL_COMMUNITY): Payer: Self-pay | Admitting: Cardiology

## 2014-01-11 ENCOUNTER — Telehealth: Payer: Self-pay | Admitting: *Deleted

## 2014-01-11 NOTE — Telephone Encounter (Signed)
[facility] protonix as I inadvertently sent it in via e-scribe due to it being requested along with two other medications. The patient was taken off of this by Dr Rayann Heman on 01/05/13. She will cancel the rx.

## 2014-01-12 ENCOUNTER — Ambulatory Visit (INDEPENDENT_AMBULATORY_CARE_PROVIDER_SITE_OTHER): Payer: Medicare Other | Admitting: Pharmacist

## 2014-01-12 DIAGNOSIS — Z5181 Encounter for therapeutic drug level monitoring: Secondary | ICD-10-CM

## 2014-01-12 DIAGNOSIS — Z7901 Long term (current) use of anticoagulants: Secondary | ICD-10-CM

## 2014-01-12 DIAGNOSIS — I4891 Unspecified atrial fibrillation: Secondary | ICD-10-CM

## 2014-01-12 LAB — POCT INR: INR: 2.2

## 2014-01-18 ENCOUNTER — Encounter: Payer: Self-pay | Admitting: Pharmacist

## 2014-02-04 ENCOUNTER — Other Ambulatory Visit: Payer: Self-pay | Admitting: Cardiology

## 2014-02-05 ENCOUNTER — Ambulatory Visit (INDEPENDENT_AMBULATORY_CARE_PROVIDER_SITE_OTHER): Payer: Medicare Other | Admitting: *Deleted

## 2014-02-05 DIAGNOSIS — I4891 Unspecified atrial fibrillation: Secondary | ICD-10-CM

## 2014-02-05 DIAGNOSIS — Z7901 Long term (current) use of anticoagulants: Secondary | ICD-10-CM

## 2014-02-05 DIAGNOSIS — Z5181 Encounter for therapeutic drug level monitoring: Secondary | ICD-10-CM

## 2014-02-05 LAB — POCT INR: INR: 2.1

## 2014-02-24 ENCOUNTER — Other Ambulatory Visit (HOSPITAL_COMMUNITY): Payer: Self-pay | Admitting: Cardiology

## 2014-02-26 ENCOUNTER — Telehealth: Payer: Self-pay | Admitting: Cardiology

## 2014-02-26 NOTE — Telephone Encounter (Signed)
LMTCB

## 2014-02-26 NOTE — Telephone Encounter (Signed)
New message     [facility].  Talk to you about refilling a medication but before she gets it refilled she need to talk to you because she is sick with bronchitis and maybe not be able to take the mediction

## 2014-02-26 NOTE — Telephone Encounter (Signed)
Follow up    Pt has some questions about her medications. Pt has the very same thing she had in Feb. 2015    Pt needs a call back please.    If calling in medication for Ogmentin.  Pt is out of [place]     Pharm # [id number]

## 2014-02-27 MED ORDER — AMOXICILLIN-POT CLAVULANATE 500-125 MG PO TABS: 500.0000 mg | ORAL_TABLET | Freq: Two times a day (BID) | ORAL | Status: DC

## 2014-02-27 NOTE — Telephone Encounter (Signed)
Follow up     Returning a nurses call from yesterday

## 2014-02-27 NOTE — Telephone Encounter (Signed)
Left Pt a message to call back.

## 2014-02-27 NOTE — Telephone Encounter (Signed)
Pt is aware that Dr. Aundra Dubin agreed to prescribe Augmentin 500 mg twice a day for 7 days. A prescription for Augmentin 500 mg twice a day for 7 days, [facility] aware.

## 2014-02-27 NOTE — Telephone Encounter (Signed)
That would be ok.  She has had in the past.

## 2014-02-27 NOTE — Telephone Encounter (Signed)
Pt called because she states has asthmatic Bronchitis. Pt states her PCP had moved to [facility] and she has to wait for a month for an appointment with a new PCP. Pt went to an Urgent care in February this year with the same problem and only was prescribed an Inhalent. Pt states Dr. Aundra Dubin prescribed Augmentin 500 mg twice a day for 7 days and that helped. Pt would like for Dr. Aundra Dubin to please help  and prescribed this medication for her. Pt is aware that this message will be send to Md for review.

## 2014-03-07 NOTE — Telephone Encounter (Signed)
Refilled 02/26/14

## 2014-03-09 ENCOUNTER — Other Ambulatory Visit (HOSPITAL_COMMUNITY): Payer: Self-pay | Admitting: Cardiology

## 2014-03-20 ENCOUNTER — Other Ambulatory Visit: Payer: Self-pay | Admitting: Cardiology

## 2014-03-20 NOTE — Telephone Encounter (Signed)
Received a request from  pharmacy to refill tikosyn, unsure from patients notes if I should refill, will ask Dr Aundra Dubin.

## 2014-03-22 ENCOUNTER — Other Ambulatory Visit (HOSPITAL_COMMUNITY): Payer: Self-pay | Admitting: Cardiology

## 2014-03-22 ENCOUNTER — Other Ambulatory Visit: Payer: Self-pay

## 2014-03-22 MED ORDER — DOFETILIDE 250 MCG PO CAPS: ORAL_CAPSULE | ORAL | Status: DC

## 2014-03-22 MED ORDER — LOSARTAN POTASSIUM 25 MG PO TABS: ORAL_TABLET | ORAL | Status: DC

## 2014-03-22 MED ORDER — CARVEDILOL 3.125 MG PO TABS: ORAL_TABLET | ORAL | Status: DC

## 2014-03-22 NOTE — Telephone Encounter (Signed)
Called for refill on her medication states she lost her coumadin needs a coumadin clinic appointment

## 2014-04-13 ENCOUNTER — Other Ambulatory Visit (HOSPITAL_COMMUNITY): Payer: Self-pay | Admitting: Cardiology

## 2014-04-13 NOTE — Telephone Encounter (Signed)
Contacted by pt stating that she has run out of warfarin.  She has h/o a-fib and has not yet switched to apixaban.  I will refill her current dose of warfarin (20 tabs).  The patient should follow-up with Dr. Aundra Dubin for further anticoagulation management.

## 2014-04-20 ENCOUNTER — Other Ambulatory Visit: Payer: Self-pay | Admitting: Cardiology

## 2014-04-22 ENCOUNTER — Telehealth: Payer: Self-pay | Admitting: Physician Assistant

## 2014-04-22 DIAGNOSIS — I5022 Chronic systolic (congestive) heart failure: Secondary | ICD-10-CM

## 2014-04-22 DIAGNOSIS — I4891 Unspecified atrial fibrillation: Secondary | ICD-10-CM

## 2014-04-22 MED ORDER — CARVEDILOL 3.125 MG PO TABS: ORAL_TABLET | ORAL | Status: DC

## 2014-04-22 NOTE — Telephone Encounter (Signed)
Maria Arnold is a 68 y.o. female with a hx of systolic CHF and AFib. She ran out of her Coreg today. This was sent to her pharmacy. Richardson Dopp, PA-C   04/22/2014 11:01 AM

## 2014-05-03 ENCOUNTER — Other Ambulatory Visit (HOSPITAL_COMMUNITY): Payer: Self-pay | Admitting: Cardiology

## 2014-05-04 ENCOUNTER — Ambulatory Visit (INDEPENDENT_AMBULATORY_CARE_PROVIDER_SITE_OTHER): Payer: Medicare Other | Admitting: Pharmacist

## 2014-05-04 DIAGNOSIS — Z7901 Long term (current) use of anticoagulants: Secondary | ICD-10-CM

## 2014-05-04 DIAGNOSIS — I4891 Unspecified atrial fibrillation: Secondary | ICD-10-CM

## 2014-05-04 DIAGNOSIS — Z5181 Encounter for therapeutic drug level monitoring: Secondary | ICD-10-CM

## 2014-05-04 LAB — POCT INR: INR: 2.6

## 2014-05-04 MED ORDER — WARFARIN SODIUM 7.5 MG PO TABS: ORAL_TABLET | ORAL | Status: DC

## 2014-05-04 NOTE — Telephone Encounter (Signed)
Last INR check 01/2014, needs OV for refill.

## 2014-05-19 ENCOUNTER — Other Ambulatory Visit: Payer: Self-pay | Admitting: Cardiology

## 2014-05-20 ENCOUNTER — Telehealth: Payer: Self-pay | Admitting: Physician Assistant

## 2014-05-20 MED ORDER — DOFETILIDE 250 MCG PO CAPS: 250.0000 ug | ORAL_CAPSULE | Freq: Two times a day (BID) | ORAL | Status: DC

## 2014-05-20 NOTE — Telephone Encounter (Signed)
Pt called Sunday answering service. She did not realize her last Tikosyn fill was only for 1 month - was filled in June for 60 caps, no refills. She has f/u appointment with Dr. Aundra Dubin 7/21. [facility] they informed her there were no refills available. Last labs and OV 10/2013 were acceptable and I see no indication that Dr. Aundra Dubin has wanted to stop this. Per patient request I sent in a 30 day rx [facility]. They do not have med in stock but per patient, they have offered to call around to locate a [facility] that does carry it to transfer rx. Pt encouraged to keep f/u as planned with Dr. Aundra Dubin later this month to obtain further refills, pending f/u labwork and assessment. Kimesha Claxton PA-C

## 2014-05-29 ENCOUNTER — Encounter: Payer: Self-pay | Admitting: Cardiology

## 2014-05-29 ENCOUNTER — Encounter: Payer: Self-pay | Admitting: *Deleted

## 2014-05-29 ENCOUNTER — Ambulatory Visit (INDEPENDENT_AMBULATORY_CARE_PROVIDER_SITE_OTHER): Payer: Medicare Other | Admitting: *Deleted

## 2014-05-29 ENCOUNTER — Ambulatory Visit (INDEPENDENT_AMBULATORY_CARE_PROVIDER_SITE_OTHER): Payer: Medicare Other | Admitting: Cardiology

## 2014-05-29 VITALS — BP 138/80 | HR 73 | Ht 62.0 in | Wt 157.0 lb

## 2014-05-29 DIAGNOSIS — I5181 Takotsubo syndrome: Secondary | ICD-10-CM

## 2014-05-29 DIAGNOSIS — I10 Essential (primary) hypertension: Secondary | ICD-10-CM

## 2014-05-29 DIAGNOSIS — Z7901 Long term (current) use of anticoagulants: Secondary | ICD-10-CM

## 2014-05-29 DIAGNOSIS — L659 Nonscarring hair loss, unspecified: Secondary | ICD-10-CM

## 2014-05-29 DIAGNOSIS — I4891 Unspecified atrial fibrillation: Secondary | ICD-10-CM

## 2014-05-29 DIAGNOSIS — I480 Paroxysmal atrial fibrillation: Secondary | ICD-10-CM

## 2014-05-29 DIAGNOSIS — Z5181 Encounter for therapeutic drug level monitoring: Secondary | ICD-10-CM

## 2014-05-29 LAB — POCT INR: INR: 1.5

## 2014-05-29 MED ORDER — APIXABAN 5 MG PO TABS: 5.0000 mg | ORAL_TABLET | Freq: Two times a day (BID) | ORAL | Status: DC

## 2014-05-29 NOTE — Patient Instructions (Addendum)
Stop tikosyn  Stop warfarin. When your INR is <2 you should start Eliquis 5mg  two times a day. Coumadin Clinic will give you instructions about this today.   Your physician recommends that you have  lab work today--Magnesium level/CBCd/BMET/TSH/Free T3/Free T4  Your physician has recommended that you wear an event monitor. Event monitors are medical devices that record the heart's electrical activity. Doctors most often [place] these monitors to diagnose arrhythmias. Arrhythmias are problems with the speed or rhythm of the heartbeat. The monitor is a small, portable device. You can wear one while you do your normal daily activities. This is usually used to diagnose what is causing palpitations/syncope (passing out). 73 DAY--ASAP  Your physician has requested that you have an echocardiogram. Echocardiography is a painless test that uses sound waves to create images of your heart. It provides your doctor with information about the size and shape of your heart and how well your heart's chambers and valves are working. This procedure takes approximately one hour. There are no restrictions for this procedure.    Your physician has requested that you regularly monitor and record your blood pressure readings at home. Please use the same machine at the same time of day to check your readings and record them. I will call you in about 2 weeks to get the readings.  You have been referred to Dr Cruzita Lederer at [facility] for hair loss.   Your physician recommends that you schedule a follow-up appointment in: 6 weeks with Dr Aundra Dubin.

## 2014-05-30 ENCOUNTER — Telehealth: Payer: Self-pay | Admitting: Cardiology

## 2014-05-30 LAB — CBC WITH DIFFERENTIAL/PLATELET
Basophils Absolute: 0 10*3/uL (ref 0.0–0.1)
Basophils Relative: 0.3 % (ref 0.0–3.0)
Eosinophils Absolute: 0.1 10*3/uL (ref 0.0–0.7)
Eosinophils Relative: 1.2 % (ref 0.0–5.0)
HCT: 41 % (ref 36.0–46.0)
Hemoglobin: 14.2 g/dL (ref 12.0–15.0)
Lymphocytes Relative: 24.4 % (ref 12.0–46.0)
Lymphs Abs: 1.6 10*3/uL (ref 0.7–4.0)
MCHC: 34.6 g/dL (ref 30.0–36.0)
MCV: 101.7 fl — ABNORMAL HIGH (ref 78.0–100.0)
Monocytes Absolute: 0.6 10*3/uL (ref 0.1–1.0)
Monocytes Relative: 9.6 % (ref 3.0–12.0)
Neutro Abs: 4.3 10*3/uL (ref 1.4–7.7)
Neutrophils Relative %: 64.5 % (ref 43.0–77.0)
Platelets: 263 10*3/uL (ref 150.0–400.0)
RBC: 4.03 Mil/uL (ref 3.87–5.11)
RDW: 13 % (ref 11.5–15.5)
WBC: 6.7 10*3/uL (ref 4.0–10.5)

## 2014-05-30 LAB — T4, FREE: Free T4: 0.96 ng/dL (ref 0.60–1.60)

## 2014-05-30 LAB — BASIC METABOLIC PANEL
BUN: 13 mg/dL (ref 6–23)
CO2: 31 mEq/L (ref 19–32)
Calcium: 9.6 mg/dL (ref 8.4–10.5)
Chloride: 102 mEq/L (ref 96–112)
Creatinine, Ser: 0.5 mg/dL (ref 0.4–1.2)
GFR: 136.63 mL/min (ref >60.00)
Glucose, Bld: 111 mg/dL — ABNORMAL HIGH (ref 70–99)
Potassium: 5.6 mEq/L — ABNORMAL HIGH (ref 3.5–5.1)
Sodium: 141 mEq/L (ref 135–145)

## 2014-05-30 LAB — MAGNESIUM: Magnesium: 2.1 mg/dL (ref 1.5–2.5)

## 2014-05-30 LAB — TSH: TSH: 1.05 u[IU]/mL (ref 0.35–4.50)

## 2014-05-30 LAB — T3, FREE: T3, Free: 3 pg/mL (ref 2.3–4.2)

## 2014-05-30 NOTE — Telephone Encounter (Signed)
Patient was reviewing her AVS from visit with Dr. Aundra Dubin yesterday. She is going to beach with husband and grandchildren after husband finishes radiation treatments one day next week.  They are staying about 10 days.  She wants to schedule the event monitor for when she gets back.  Her instructions say ASAP so she wants to make sure it is ok to wait that long.  She states she is going to wear monitor because she may have a second ablation if she shows evidence of afib.  She reports no other cardiac issues.  Will route to Dr. Claris Gladden primary nurse to confirm.

## 2014-05-30 NOTE — Telephone Encounter (Signed)
New message    Patient calling has questions regarding office visit on  7/21 .

## 2014-05-30 NOTE — Progress Notes (Signed)
Patient ID: Maria Arnold, female   DOB: 03-Jun-1946, 68 y.o.   MRN: [id number] 67 yo with hospital admission in 2/12 for atrial fibrillation and CHF presents for followup.  For about a week prior to admission, she had had congestion and shortness of breath. She thought she had a bad cold. She was seen by her PCP and ECG showed atrial fibrillation with RVR. She was admitted to [facility] for evaluation. CXR did not show PNA. She had wheezing consistent with a prior asthma diagnosis. She was then noted by echo to have EF 30-35% with peri-apical severe hypokinesis. LHC was done, there was no angiographic coronary disease. 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 She had suffered an emotional shock about a week prior to admission when her daughter-in-law had a miscarriage.   She underwent TEE-guided cardioversion, but this failed to convert her to NSR. She was then started on Tikosyn, which did convert her to NSR but her QTc prolonged in the setting of using moxifloxacin for acute bronchitis treatment. Moxifloxacin was stopped and the dofetilide dose was cut back to 250 micrograms two times a day. At this dose, QTc was normal.  Repeat echo done in 7/12 showed EF improved to 55-60% with normal RV and valves.  She underwent atrial fibrillation ablation in 11/13.  In 4/14, we talked about stopping Tikosyn and she got very nervous.  She actually went into atrial fibrillation in the office so I did not stop Tikosyn.     Patient has occasional palpitations but no documented recurrent atrial fibrillation for over a year.  She does not get exertional dyspnea or chest pain.  No orthopnea/PND.  She is under a lot of stress as her husband is being treated for prostate cancer.  She has had some hair loss and is concerned that it is coming from one of her medications.  She does not like taking coumadin.   ECG: NSR, QTc 431 msec  Labs (2/12): K 4.8, creatinine 0.67   Labs (3/12): K 4.2, creatinine 0.5  Labs (11/13): K 3.8, creatinine 0.5 Labs (4/14): K 4.1, creatinine 0.7, HCT 44.7 Labs (12/14): K 4.1, creatinine 0.6, HCT 42, TSH normal, Mg 2.1  Allergies (verified):  No Known Drug Allergies   Past Medical History:  1. Paroxysmal atrial fibrillation with rapid ventricular response. She is on coumadin and dofetilide. She has had some breakthrough atrial fib on dofetilide. Atrial fibrillation ablation in 11/13. 2. Cardiomyopathy: Nonischemic. LHC (2/12): No coronary disease, EF 30-35% with periapical severe hypokinesis. Echo (2/12): EF 30-35%, periapical severe hypokinesis, normal RV, mild to moderate MR. 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  Repeat echo in 7/12 showed EF 55-60%, mild biatrial enlargement, normal RV, normal valves.  3. Asthma  4. HTN  5. Foot pain from bone spur  6. Low back pain  7. Narrow angle glaucoma 8. Nasal bone fracture s/p ENT surgery  Family History:  No premature CAD   Social History:  Lives in [place] with husband. 2 children, 4 grandchildren. Nonsmoker, occasional ETOH.   Review of Systems  All systems reviewed and negative except as per HPI.   Current Outpatient Prescriptions  Medication Sig Dispense Refill  . Ascorbic Acid (VITAMIN C) 500 MG tablet Take 500 mg by mouth daily.        . Calcium-Magnesium-Zinc (CAL-MAG-ZINC PO) Take 1 tablet by mouth daily.       Marland Kitchen  carvedilol (COREG) 3.125 MG tablet TAKE 1 TABLET BY MOUTH TWICE A DAY WITH A MEAL  60 tablet  2  . Cyanocobalamin (VITAMIN B-12 CR PO) Take 3 tablets by mouth daily.       Marland Kitchen losartan (COZAAR) 25 MG tablet TAKE 1 TABLET EVERY DAY  30 tablet  0  . Multiple Vitamin (MULTIVITAMIN WITH MINERALS) TABS Take 1 tablet by mouth daily.      Marland Kitchen apixaban (ELIQUIS) 5 MG TABS tablet Take 1 tablet (5 mg total) by mouth 2 (two) times daily.  60 tablet  2   No current  facility-administered medications for this visit.    BP 138/80  Pulse 73  Ht 5\' 2"  (1.575 m)  Wt 157 lb (71.215 kg)  BMI 28.71 kg/m2 General: NAD Neck: No JVD, no thyromegaly or thyroid nodule.  Lungs: Clear to auscultation bilaterally with normal respiratory effort. CV: Nondisplaced PMI.  Heart regular S1/S2, no S3/S4, no murmur.  No peripheral edema.  No carotid bruit.  Normal pedal pulses.  Abdomen: Soft, nontender, no hepatosplenomegaly, no distention.  Neurologic: Alert and oriented x 3.  Psych: Normal affect. Extremities: No clubbing or cyanosis.   Assessment/Plan: 1. Takotsubo cardiomyopathy: Resolved, EF normal on last echo in 2012.  Would continue Coreg and losartan for now.  I will get a repeat echo before her followup.  If EF remains normal, I would consider letting her stop Coreg as this could be the source of her hair loss.  2. Atrial fibrillation: Paroxysmal.  She has been doing well on Tikosyn.  No documented atrial fibrillation since 4/14. - I am going to let her stop Tikosyn.  I will put her on a 30 day monitor also.  If there is recurrent atrial fibrillation, I will refer her back to Dr. Rayann Heman for re-do ablation. - Check CBC, Mg and BMET today.   QT interval ok today on ECG.   - I will have her stop coumadin and begin Eliquis 5 mg bid when INR < 2.  3. HTN: BP is under reasonable control on current regimen.  4. Patient is concerned for an endocrine problem given generalized fatigue and hair loss.  I will check TSH, free T3 and free T4.  She would like a referral to see an endocrinologist, which I will provide.   Followup in 6 weeks after event monitor is completed.    Loralie Champagne 05/30/2014

## 2014-05-31 ENCOUNTER — Ambulatory Visit: Payer: Medicare Other | Admitting: Internal Medicine

## 2014-05-31 ENCOUNTER — Other Ambulatory Visit: Payer: Self-pay | Admitting: *Deleted

## 2014-05-31 DIAGNOSIS — E875 Hyperkalemia: Secondary | ICD-10-CM

## 2014-06-08 NOTE — Telephone Encounter (Signed)
LMTCB

## 2014-06-08 NOTE — Telephone Encounter (Signed)
New message   Pt coming in Tuesday for lab work.. Pt is requesting a call back to determine if it is ok for her to get a message, being that she is on a blood thinner. Please call

## 2014-06-08 NOTE — Telephone Encounter (Signed)
Spoke with patient about lab appt on 06/13/14

## 2014-06-11 ENCOUNTER — Telehealth: Payer: Self-pay | Admitting: *Deleted

## 2014-06-11 NOTE — Telephone Encounter (Signed)
05/29/2014-pt to call back to schedule on 05/30/2014.lm 06/01/14-left message for pt to return call to schedule echo/event/follow up with Dr. Aundra Dubin after event monitor removed.lm  06/11/14 LMOM TO CALL AND SCHEDULE/D.Vernetta Dizdarevic

## 2014-06-11 NOTE — Telephone Encounter (Signed)
What would you like me to do?

## 2014-06-13 ENCOUNTER — Other Ambulatory Visit: Payer: Medicare Other

## 2014-06-14 ENCOUNTER — Other Ambulatory Visit (INDEPENDENT_AMBULATORY_CARE_PROVIDER_SITE_OTHER): Payer: Medicare Other

## 2014-06-14 DIAGNOSIS — I4891 Unspecified atrial fibrillation: Secondary | ICD-10-CM

## 2014-06-14 DIAGNOSIS — E875 Hyperkalemia: Secondary | ICD-10-CM

## 2014-06-14 DIAGNOSIS — Z7901 Long term (current) use of anticoagulants: Secondary | ICD-10-CM

## 2014-06-15 LAB — BASIC METABOLIC PANEL
BUN: 13 mg/dL (ref 6–23)
CO2: 28 mEq/L (ref 19–32)
Calcium: 9.6 mg/dL (ref 8.4–10.5)
Chloride: 102 mEq/L (ref 96–112)
Creatinine, Ser: 0.5 mg/dL (ref 0.4–1.2)
GFR: 119.25 mL/min (ref >60.00)
Glucose, Bld: 101 mg/dL — ABNORMAL HIGH (ref 70–99)
Potassium: 4.2 mEq/L (ref 3.5–5.1)
Sodium: 138 mEq/L (ref 135–145)

## 2014-06-18 ENCOUNTER — Other Ambulatory Visit: Payer: Self-pay | Admitting: *Deleted

## 2014-06-18 ENCOUNTER — Telehealth: Payer: Self-pay | Admitting: *Deleted

## 2014-06-18 MED ORDER — DOFETILIDE 250 MCG PO CAPS: 250.0000 ug | ORAL_CAPSULE | Freq: Two times a day (BID) | ORAL | Status: DC

## 2014-06-18 NOTE — Telephone Encounter (Signed)
Rx sent in

## 2014-06-18 NOTE — Telephone Encounter (Signed)
Will forward to Dr. Aundra Dubin to determine if OK to refill Tikosyn as pt is continuing per note below

## 2014-06-18 NOTE — Telephone Encounter (Signed)
Patient stated that she is still taking the tikosyn, eventhough Dr Aundra Dubin told her to stop at her last ov. She stated that she is at the beach and she forgot to tell him that she prefer to wait until she comes home to stop taking this.  If approved, [facility] [place] and she will have [facility] [place]. Please advise. Thanks, MI

## 2014-06-18 NOTE — Telephone Encounter (Signed)
That is ok, can refill

## 2014-06-22 ENCOUNTER — Other Ambulatory Visit: Payer: Self-pay | Admitting: *Deleted

## 2014-06-22 MED ORDER — LOSARTAN POTASSIUM 25 MG PO TABS: ORAL_TABLET | ORAL | Status: DC

## 2014-07-02 ENCOUNTER — Ambulatory Visit: Payer: Medicare Other | Admitting: Internal Medicine

## 2014-07-19 ENCOUNTER — Other Ambulatory Visit: Payer: Self-pay | Admitting: Cardiology

## 2014-07-31 ENCOUNTER — Other Ambulatory Visit: Payer: Self-pay | Admitting: Cardiology

## 2014-08-08 ENCOUNTER — Telehealth: Payer: Self-pay | Admitting: Cardiology

## 2014-08-08 NOTE — Telephone Encounter (Signed)
New message      Talk to a nurse about something she saw on mychart

## 2014-08-08 NOTE — Telephone Encounter (Signed)
Returned patient's call regarding MyChart.  Patient concerned and confused as to how MyChart works. Patient wanted to know why [facility] was resulting her facial CT from another doctor. Explained to patient that MyChart is used system wide, and that it isn't just [facility]'s computer system. That all practices in the system upload patient information to MyChart for continuity of care and ease for the patient.  Patient was grateful for the explanation and stated understanding.

## 2014-08-21 ENCOUNTER — Other Ambulatory Visit: Payer: Self-pay

## 2014-08-21 ENCOUNTER — Other Ambulatory Visit: Payer: Self-pay | Admitting: Physician Assistant

## 2014-08-21 MED ORDER — DOFETILIDE 250 MCG PO CAPS: ORAL_CAPSULE | ORAL | Status: DC

## 2014-09-18 ENCOUNTER — Telehealth: Payer: Self-pay | Admitting: Cardiology

## 2014-09-18 NOTE — Telephone Encounter (Signed)
New message      Patient's PCP want to put her on a zpak---will this interfere with her afib?  Patient has bronchitis.

## 2014-09-18 NOTE — Telephone Encounter (Signed)
Will forward to Dr McLean 

## 2014-09-18 NOTE — Telephone Encounter (Signed)
Would rather her take doxycycline given dofetilide use.

## 2014-09-18 NOTE — Telephone Encounter (Signed)
LMTCB

## 2014-09-19 NOTE — Telephone Encounter (Signed)
LMTCB

## 2014-09-19 NOTE — Telephone Encounter (Signed)
Answering machine. 

## 2014-09-22 ENCOUNTER — Other Ambulatory Visit: Payer: Self-pay | Admitting: Cardiology

## 2014-09-24 ENCOUNTER — Other Ambulatory Visit: Payer: Self-pay | Admitting: *Deleted

## 2014-09-24 MED ORDER — APIXABAN 5 MG PO TABS: 5.0000 mg | ORAL_TABLET | Freq: Two times a day (BID) | ORAL | Status: DC

## 2014-09-28 NOTE — Telephone Encounter (Signed)
Voicemail.

## 2014-10-11 ENCOUNTER — Other Ambulatory Visit: Payer: Self-pay | Admitting: *Deleted

## 2014-10-11 MED ORDER — AMOXICILLIN-POT CLAVULANATE 875-125 MG PO TABS: 1.0000 | ORAL_TABLET | Freq: Two times a day (BID) | ORAL | Status: DC

## 2014-10-18 ENCOUNTER — Encounter (HOSPITAL_COMMUNITY): Payer: Self-pay | Admitting: Internal Medicine

## 2014-10-22 ENCOUNTER — Other Ambulatory Visit: Payer: Self-pay | Admitting: *Deleted

## 2014-10-22 ENCOUNTER — Other Ambulatory Visit: Payer: Self-pay | Admitting: Cardiology

## 2014-10-22 MED ORDER — DOFETILIDE 250 MCG PO CAPS: ORAL_CAPSULE | ORAL | Status: DC

## 2014-10-25 ENCOUNTER — Other Ambulatory Visit: Payer: Self-pay | Admitting: Cardiology

## 2014-10-25 ENCOUNTER — Other Ambulatory Visit: Payer: Self-pay

## 2014-10-25 ENCOUNTER — Ambulatory Visit: Payer: Medicare Other | Admitting: Cardiology

## 2014-10-25 MED ORDER — LOSARTAN POTASSIUM 25 MG PO TABS: 25.0000 mg | ORAL_TABLET | Freq: Every day | ORAL | Status: DC

## 2014-12-13 ENCOUNTER — Encounter: Payer: Self-pay | Admitting: *Deleted

## 2014-12-13 ENCOUNTER — Ambulatory Visit (INDEPENDENT_AMBULATORY_CARE_PROVIDER_SITE_OTHER): Payer: Medicare Other | Admitting: Cardiology

## 2014-12-13 ENCOUNTER — Encounter: Payer: Self-pay | Admitting: Cardiology

## 2014-12-13 VITALS — BP 134/74 | HR 97 | Ht 62.0 in | Wt 148.0 lb

## 2014-12-13 DIAGNOSIS — I5022 Chronic systolic (congestive) heart failure: Secondary | ICD-10-CM

## 2014-12-13 DIAGNOSIS — I10 Essential (primary) hypertension: Secondary | ICD-10-CM

## 2014-12-13 DIAGNOSIS — L659 Nonscarring hair loss, unspecified: Secondary | ICD-10-CM

## 2014-12-13 DIAGNOSIS — I480 Paroxysmal atrial fibrillation: Secondary | ICD-10-CM

## 2014-12-13 LAB — CBC WITH DIFFERENTIAL/PLATELET
Basophils Absolute: 0 10*3/uL (ref 0.0–0.1)
Basophils Relative: 0.3 % (ref 0.0–3.0)
Eosinophils Absolute: 0.1 10*3/uL (ref 0.0–0.7)
Eosinophils Relative: 0.6 % (ref 0.0–5.0)
HCT: 43.5 % (ref 36.0–46.0)
Hemoglobin: 15 g/dL (ref 12.0–15.0)
Lymphocytes Relative: 19.3 % (ref 12.0–46.0)
Lymphs Abs: 1.6 10*3/uL (ref 0.7–4.0)
MCHC: 34.5 g/dL (ref 30.0–36.0)
MCV: 99.8 fl (ref 78.0–100.0)
Monocytes Absolute: 0.9 10*3/uL (ref 0.1–1.0)
Monocytes Relative: 11.1 % (ref 3.0–12.0)
Neutro Abs: 5.6 10*3/uL (ref 1.4–7.7)
Neutrophils Relative %: 68.7 % (ref 43.0–77.0)
Platelets: 307 10*3/uL (ref 150.0–400.0)
RBC: 4.36 Mil/uL (ref 3.87–5.11)
RDW: 13.5 % (ref 11.5–15.5)
WBC: 8.1 10*3/uL (ref 4.0–10.5)

## 2014-12-13 LAB — BASIC METABOLIC PANEL
BUN: 12 mg/dL (ref 6–23)
CO2: 29 mEq/L (ref 19–32)
Calcium: 9.2 mg/dL (ref 8.4–10.5)
Chloride: 101 mEq/L (ref 96–112)
Creatinine, Ser: 0.63 mg/dL (ref 0.40–1.20)
GFR: 99.67 mL/min (ref >60.00)
Glucose, Bld: 99 mg/dL (ref 70–99)
Potassium: 4.1 mEq/L (ref 3.5–5.1)
Sodium: 136 mEq/L (ref 135–145)

## 2014-12-13 LAB — T4, FREE: Free T4: 0.95 ng/dL (ref 0.60–1.60)

## 2014-12-13 LAB — LIPID PANEL
Cholesterol: 171 mg/dL (ref 0–200)
HDL: 48.1 mg/dL (ref >39.00)
LDL Cholesterol: 97 mg/dL (ref 0–99)
NonHDL: 122.9
Total CHOL/HDL Ratio: 4
Triglycerides: 129 mg/dL (ref 0.0–149.0)
VLDL: 25.8 mg/dL (ref 0.0–40.0)

## 2014-12-13 LAB — TSH: TSH: 1.63 u[IU]/mL (ref 0.35–4.50)

## 2014-12-13 LAB — T3, FREE: T3, Free: 3.8 pg/mL (ref 2.3–4.2)

## 2014-12-13 MED ORDER — DIAZEPAM 2 MG PO TABS
ORAL_TABLET | ORAL | Status: DC
Start: 2014-12-13 — End: 2014-12-20

## 2014-12-13 MED ORDER — BISOPROLOL FUMARATE 5 MG PO TABS: 5.0000 mg | ORAL_TABLET | Freq: Every day | ORAL | Status: DC

## 2014-12-13 NOTE — Patient Instructions (Addendum)
Stop coreg (carvedilol)  Start bisoprolol 5mg  daily.    I have given you a written prescription for valium, if you need a refill you should contact your primary care doctor.   Your physician recommends that you have  lab work today--CBCd/BMET/Lipid profile/TSH/Free T3/Free T4.  Your physician has requested that you have an echocardiogram. Echocardiography is a painless test that uses sound waves to create images of your heart. It provides your doctor with information about the size and shape of your heart and how well your heart's chambers and valves are working. This procedure takes approximately one hour. There are no restrictions for this procedure.  You have been referred to Dr [name] for atrial fibrillation ablation.   Your physician recommends that you schedule a follow-up appointment in: 3 months with Dr Aundra Dubin.

## 2014-12-14 NOTE — Progress Notes (Signed)
Patient ID: Maria Arnold, female   DOB: Sep 29, 1946, 69 y.o.   MRN: [id number]  PCP: Elyn Aquas  69 yo with hospital admission in 2/12 for atrial fibrillation and CHF presents for followup.  For about a week prior to admission, she had had congestion and shortness of breath. She thought she had a bad cold. She was seen by her PCP and ECG showed atrial fibrillation with RVR. She was admitted to [facility] for evaluation. CXR did not show PNA. She had wheezing consistent with a prior asthma diagnosis. She was then noted by echo to have EF 30-35% with peri-apical severe hypokinesis. LHC was done, there was no angiographic coronary disease. 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 She had suffered an emotional shock about a week prior to admission when her daughter-in-law had a miscarriage.   She underwent TEE-guided cardioversion, but this failed to convert her to NSR. She was then started on Tikosyn, which did convert her to NSR but her QTc prolonged in the setting of using moxifloxacin for acute bronchitis treatment. Moxifloxacin was stopped and the dofetilide dose was cut back to 250 micrograms two times a day. At this dose, QTc was normal.  Repeat echo done in 7/12 showed EF improved to 55-60% with normal RV and valves.  She underwent atrial fibrillation ablation in 11/13.  In 4/14, we talked about stopping Tikosyn and she got very nervous.  She actually went into atrial fibrillation in the office so I did not stop Tikosyn.     Currently, she is on Tikosyn and Eliquis.  She has been under a lot of stress (husband has prostate cancer and is being treated currently).  She has had several bouts of bronchitis recently (picked up from her grandchildren).  She has recurrent episodes of palpitations that usually last for several hours. She attributes the palpitations to atrial fibrillation.  She is having palpitations currently and is indeed in atrial  fibrillation.  She does not like the feeling of atrial fibrillation.  It does not make her feel short of breath but it does make her more anxious and she is very conscious of her heartbeat.  No exertional dyspnea or chest pain. Weight is down 9 lbs.   ECG: Atrial fibrillation, rate 97, normal QT interval  Labs (2/12): K 4.8, creatinine 0.67  Labs (3/12): K 4.2, creatinine 0.5  Labs (11/13): K 3.8, creatinine 0.5 Labs (4/14): K 4.1, creatinine 0.7, HCT 44.7 Labs (12/14): K 4.1, creatinine 0.6, HCT 42, TSH normal, Mg 2.1 Labs (8/15): K 4.2, creatinine 0.5, HCT 41, thyroid indices normal  Allergies (verified):  No Known Drug Allergies   Past Medical History:  1. Paroxysmal atrial fibrillation with rapid ventricular response. She is on coumadin and dofetilide. She has had some breakthrough atrial fib on dofetilide. Atrial fibrillation ablation in 11/13. 2. Cardiomyopathy: Nonischemic. LHC (2/12): No coronary disease, EF 30-35% with periapical severe hypokinesis. Echo (2/12): EF 30-35%, periapical severe hypokinesis, normal RV, mild to moderate MR. 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  Repeat echo in 7/12 showed EF 55-60%, mild biatrial enlargement, normal RV, normal valves.  3. Asthma  4. HTN  5. Foot pain from bone spur  6. Low back pain  7. Narrow angle glaucoma 8. Nasal bone fracture s/p ENT surgery  Family History:  No premature CAD   Social History:  Lives in [place] with husband. 2 children, 3 grandchildren.  Nonsmoker, occasional ETOH.   Review of Systems  All systems reviewed and negative except as per HPI.   Current Outpatient Prescriptions  Medication Sig Dispense Refill  . amoxicillin-clavulanate (AUGMENTIN) 875-125 MG per tablet Take 1 tablet by mouth 2 (two) times daily. for 10 days 20 tablet 0  . Ascorbic Acid (VITAMIN C) 500 MG tablet Take 500 mg by mouth daily.      . bisoprolol  (ZEBETA) 5 MG tablet Take 1 tablet (5 mg total) by mouth daily. 90 tablet 3  . Calcium-Magnesium-Zinc (CAL-MAG-ZINC PO) Take 1 tablet by mouth daily.     . Cyanocobalamin (VITAMIN B-12 CR PO) Take 3 tablets by mouth daily.     . diazepam (VALIUM) 2 MG tablet 1 tablet daily as needed 15 tablet 0  . dofetilide (TIKOSYN) 250 MCG capsule TAKE ONE CAPSULE BY MOUTH EVERY 12 HOURS 60 capsule 1  . ELIQUIS 5 MG TABS tablet TAKE 1 TABLET BY MOUTH TWICE A DAY 60 tablet 1  . losartan (COZAAR) 25 MG tablet Take 1 tablet (25 mg total) by mouth daily. 30 tablet 1  . Multiple Vitamin (MULTIVITAMIN WITH MINERALS) TABS Take 1 tablet by mouth daily.     No current facility-administered medications for this visit.    BP 134/74 mmHg  Pulse 97  Ht 5\' 2"  (1.575 m)  Wt 148 lb (67.132 kg)  BMI 27.06 kg/m2 General: NAD Neck: No JVD, no thyromegaly or thyroid nodule.  Lungs: Clear to auscultation bilaterally with normal respiratory effort. CV: Nondisplaced PMI.  Heart regular S1/S2, no S3/S4, no murmur.  No peripheral edema.  No carotid bruit.  Normal pedal pulses.  Abdomen: Soft, nontender, no hepatosplenomegaly, no distention.  Neurologic: Alert and oriented x 3.  Psych: Normal affect. Extremities: No clubbing or cyanosis.   Assessment/Plan: 1. Takotsubo cardiomyopathy: Resolved, EF normal on last echo in 2012.  I am going to have get a repeat echo.  She will continue losartan.  I wonder if the Coreg is contributing to the wheezing she gets with bronchitis episodes.  Therefore, I will have her try stopping Coreg and instead using bisoprolol 5 mg daily (more beta-1 selective).  2. Atrial fibrillation: Paroxysmal.  She has been on Tikosyn.  She has periodic tachypalpitations that may be atrial fibrillation.  She has tachypalpitations today and is in atrial fibrillation.  She is symptomatic with atrial fibrillation recurrences.  - She will continue Tikosyn.  - Check CBC, Mg and BMET today.   QT interval ok today  on ECG.   - Continue Eliquis.  - I will refer her back to Dr. Rayann Heman for redo ablation.  3. HTN: BP is under reasonable control on current regimen.  4. Patient is concerned for an endocrine problem given generalized fatigue and hair loss.  However, thyroid indices have been normal.    Followup in 3 months.    Loralie Champagne 12/14/2014

## 2014-12-18 ENCOUNTER — Other Ambulatory Visit: Payer: Self-pay | Admitting: Cardiology

## 2014-12-20 ENCOUNTER — Telehealth: Payer: Self-pay | Admitting: Internal Medicine

## 2014-12-20 ENCOUNTER — Other Ambulatory Visit: Payer: Self-pay | Admitting: Cardiology

## 2014-12-20 ENCOUNTER — Ambulatory Visit (HOSPITAL_COMMUNITY): Payer: Medicare Other | Attending: Cardiology | Admitting: Radiology

## 2014-12-20 ENCOUNTER — Ambulatory Visit (INDEPENDENT_AMBULATORY_CARE_PROVIDER_SITE_OTHER): Payer: Medicare Other | Admitting: Internal Medicine

## 2014-12-20 ENCOUNTER — Encounter: Payer: Self-pay | Admitting: Internal Medicine

## 2014-12-20 VITALS — BP 140/100 | HR 95 | Ht 62.0 in | Wt 153.2 lb

## 2014-12-20 DIAGNOSIS — I480 Paroxysmal atrial fibrillation: Secondary | ICD-10-CM

## 2014-12-20 DIAGNOSIS — I5181 Takotsubo syndrome: Secondary | ICD-10-CM

## 2014-12-20 DIAGNOSIS — L659 Nonscarring hair loss, unspecified: Secondary | ICD-10-CM

## 2014-12-20 DIAGNOSIS — I10 Essential (primary) hypertension: Secondary | ICD-10-CM

## 2014-12-20 DIAGNOSIS — I4891 Unspecified atrial fibrillation: Secondary | ICD-10-CM

## 2014-12-20 DIAGNOSIS — I4819 Other persistent atrial fibrillation: Secondary | ICD-10-CM

## 2014-12-20 DIAGNOSIS — I481 Persistent atrial fibrillation: Secondary | ICD-10-CM

## 2014-12-20 MED ORDER — APIXABAN 5 MG PO TABS: 5.0000 mg | ORAL_TABLET | Freq: Two times a day (BID) | ORAL | Status: DC

## 2014-12-20 NOTE — Telephone Encounter (Signed)
New message      March 8 is a good date for her to have her ablation if possible.  She also has questions for you when you call her back.

## 2014-12-20 NOTE — Progress Notes (Signed)
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 Since her afib ablation 11/13, the patient reports doing very well until December of this year when she took Doxycycline for URI. She thinks she has had persistent afib since that time.  She has also had hair loss and is concerned that it may be medication related. She would like to stop tikosyn if possible. Recent thyroid panel was normal. Continues on apixiban for chadsvasc score of 4.    Today, she denies symptoms of palpitations, chest pain, shortness of breath,  lower extremity edema, dizziness, presyncope, or syncope.  The patient is otherwise without complaint today.   Past Medical History  Diagnosis Date  . Cardiomyopathy     Nonischemic.  LHC (2/12): No coronary disease, EF 30-35% with periapical severe  hypokinesis.  Echo (2/12): EF 30-35%, periapical severe hypokinesis, normal RV, mild to moderate MR.  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   . Asthma   . HTN (hypertension)   . Foot pain     From bone spur  . Back pain     Low back  . PAF (paroxysmal atrial fibrillation)     with rapid ventricular response.  She is on coumadin and dofetilide.  She has had some breakthrough atrial fib on dofetilide.   Past Surgical History  Procedure Laterality Date  . Nasal sinus surgery    . Dilation and curettage of uterus    . Laparoscopic abdominal exploration    . Tee without cardioversion  09/21/2012    Procedure: TRANSESOPHAGEAL ECHOCARDIOGRAM (TEE);  Surgeon: Thayer Headings, MD;  Location: [facility] ENDOSCOPY;  Service: Cardiovascular;  Laterality: N/A;  . Atrial fibrillation ablation N/A 09/22/2012    Procedure: ATRIAL FIBRILLATION ABLATION;  Surgeon: [name], MD;  Location: [facility] CATH LAB;  Service: Cardiovascular;   Laterality: N/A;    Current Outpatient Prescriptions  Medication Sig Dispense Refill  . amoxicillin-clavulanate (AUGMENTIN) 875-125 MG per tablet Take 1 tablet by mouth 2 (two) times daily.    Marland Kitchen apixaban (ELIQUIS) 5 MG TABS tablet Take 1 tablet (5 mg total) by mouth 2 (two) times daily. 60 tablet 10  . Ascorbic Acid (VITAMIN C) 500 MG tablet Take 500 mg by mouth daily.      . bisoprolol (ZEBETA) 5 MG tablet Take 1 tablet (5 mg total) by mouth daily. 90 tablet 3  . Calcium-Magnesium-Zinc (CAL-MAG-ZINC PO) Take 1 tablet by mouth daily.     . Cyanocobalamin (VITAMIN B-12 CR PO) Take 3 tablets by mouth daily.     Marland Kitchen dofetilide (TIKOSYN) 250 MCG capsule TAKE ONE CAPSULE BY MOUTH EVERY 12 HOURS 60 capsule 1  . losartan (COZAAR) 25 MG tablet TAKE 1 TABLET BY MOUTH EVERY DAY 30 tablet 10  . Multiple Vitamin (MULTIVITAMIN WITH MINERALS) TABS Take 1 tablet by mouth daily.    Marland Kitchen albuterol (PROVENTIL HFA;VENTOLIN HFA) 108 (90 BASE) MCG/ACT inhaler Inhale 2 puffs into the lungs every 6 (six) hours as needed. Shortness of breath or wheezing     No current facility-administered medications for this visit.    Physical Exam: Filed Vitals:   12/20/14 1046  BP: 140/100  Pulse: 95  Height: 5\' 2"  (1.575 m)  Weight: 153 lb 3.2 oz (69.491 kg)    GEN- The patient is well appearing, alert and oriented x 3  today.   Head- normocephalic, atraumatic Eyes-  Sclera clear, conjunctiva pink Ears- hearing intact Oropharynx- clear Lungs- Clear to ausculation bilaterally, normal work of breathing Heart- Irregular rate and rhythm, no murmurs, rubs or gallops, PMI not laterally displaced GI- soft, NT, ND, + BS Extremities- no clubbing, cyanosis, or edema  ekg today reveals atrial fibrillation at 95 bpm.  Echo Left ventricle: The cavity size was normal. There was mild concentric hypertrophy. Systolic function was normal. The estimated ejection fraction was in the range of 55% to 60%. Wall motion was normal;  there were no regional wall motion abnormalities. The study was not technically sufficient to allow evaluation of LV diastolic dysfunction due to atrial fibrillation. - Aortic root: The aortic root was normal in size. - Mitral valve: There was mild regurgitation. - Right ventricle: Systolic pressure was within the normal range. - Right atrium: The atrium was normal in size. - Tricuspid valve: There was mild regurgitation. - Pulmonic valve: There was no regurgitation. - Pulmonary arteries: Systolic pressure was within the normal range. - Inferior vena cava: The vessel was normal in size. - Pericardium, extracardiac: There was no pericardial effusion. Left Atrium 40 mm  Assessment and Plan:  1. afib Return of persistent afib despite medical therapy with tikosyn. Therapeutic strategies for afib including medicine and repeat ablation were discussed in detail with the patient today. Risk, benefits, and alternatives to EP study and radiofrequency ablation for afib were also discussed in detail today. These risks include but are not limited to stroke, bleeding, vascular damage, tamponade, perforation, damage to the esophagus, lungs, and other structures, pulmonary vein stenosis, radiation exposure, worsening renal function, and death. The patient understands these risk and wishes to proceed.  We will therefore proceed with catheter ablation once the patient gives [place] dates to proceed. Continue Tikosyn   Continue apixaban   2. HTN Above goal States white coat hypertension. Does not want adjustment at this time.  3. Chronic systolic dysfunction Echo today is reviewed and reveals normalized EF No changes today

## 2014-12-20 NOTE — Patient Instructions (Signed)
Your physician has recommended that you have an ablation. Catheter ablation is a medical procedure used to treat some cardiac arrhythmias (irregular heartbeats). During catheter ablation, a long, thin, flexible tube is put into a blood vessel in your groin (upper thigh), or neck. This tube is called an ablation catheter. It is then guided to your heart through the blood vessel. Radio frequency waves destroy small areas of heart tissue where abnormal heartbeats may cause an arrhythmia to start. Please see the instruction sheet given to you today.  Call Maria Arnold once you decide about date for procedure

## 2014-12-20 NOTE — Progress Notes (Signed)
Echocardiogram performed.  

## 2014-12-20 NOTE — Progress Notes (Deleted)
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 Since her afib ablation 11/13, the patient reports doing very well until December of this year when she took Doxycycline for URI. She thinks she has had persistent afib since that time.  She has also had hair loss and is concerned that it may be medication related. She would like to stop tikosyn if possible. Recent thyroid panel was normal. Continues on apixiban for chadsvasc score of 4.    Today, she denies symptoms of palpitations, chest pain, shortness of breath,  lower extremity edema, dizziness, presyncope, or syncope.  The patient is otherwise without complaint today.   Past Medical History  Diagnosis Date  . Cardiomyopathy     Nonischemic.  LHC (2/12): No coronary disease, EF 30-35% with periapical severe  hypokinesis.  Echo (2/12): EF 30-35%, periapical severe hypokinesis, normal RV, mild to moderate MR.  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   . Asthma   . HTN (hypertension)   . Foot pain     From bone spur  . Back pain     Low back  . PAF (paroxysmal atrial fibrillation)     with rapid ventricular response.  She is on coumadin and dofetilide.  She has had some breakthrough atrial fib on dofetilide.   Past Surgical History  Procedure Laterality Date  . Nasal sinus surgery    . Dilation and curettage of uterus    . Laparoscopic abdominal exploration    . Tee without cardioversion  09/21/2012    Procedure: TRANSESOPHAGEAL ECHOCARDIOGRAM (TEE);  Surgeon: Thayer Headings, MD;  Location: [facility] ENDOSCOPY;  Service: Cardiovascular;  Laterality: N/A;  . Atrial fibrillation ablation N/A 09/22/2012    Procedure: ATRIAL FIBRILLATION ABLATION;  Surgeon: [name], MD;  Location: [facility] CATH LAB;  Service: Cardiovascular;   Laterality: N/A;    Current Outpatient Prescriptions  Medication Sig Dispense Refill  . amoxicillin-clavulanate (AUGMENTIN) 875-125 MG per tablet Take 1 tablet by mouth 2 (two) times daily.    Marland Kitchen apixaban (ELIQUIS) 5 MG TABS tablet Take 1 tablet (5 mg total) by mouth 2 (two) times daily. 60 tablet 10  . Ascorbic Acid (VITAMIN C) 500 MG tablet Take 500 mg by mouth daily.      . bisoprolol (ZEBETA) 5 MG tablet Take 1 tablet (5 mg total) by mouth daily. 90 tablet 3  . Calcium-Magnesium-Zinc (CAL-MAG-ZINC PO) Take 1 tablet by mouth daily.     . Cyanocobalamin (VITAMIN B-12 CR PO) Take 3 tablets by mouth daily.     Marland Kitchen dofetilide (TIKOSYN) 250 MCG capsule TAKE ONE CAPSULE BY MOUTH EVERY 12 HOURS 60 capsule 1  . losartan (COZAAR) 25 MG tablet TAKE 1 TABLET BY MOUTH EVERY DAY 30 tablet 10  . Multiple Vitamin (MULTIVITAMIN WITH MINERALS) TABS Take 1 tablet by mouth daily.    Marland Kitchen albuterol (PROVENTIL HFA;VENTOLIN HFA) 108 (90 BASE) MCG/ACT inhaler Inhale 2 puffs into the lungs every 6 (six) hours as needed. Shortness of breath or wheezing     No current facility-administered medications for this visit.    Physical Exam: Filed Vitals:   12/20/14 1046  BP: 140/100  Pulse: 95  Height: 5\' 2"  (1.575 m)  Weight: 153 lb 3.2 oz (69.491 kg)    GEN- The patient is well appearing, alert and oriented x 3  today.   Head- normocephalic, atraumatic Eyes-  Sclera clear, conjunctiva pink Ears- hearing intact Oropharynx- clear Lungs- Clear to ausculation bilaterally, normal work of breathing Heart- Irregular rate and rhythm, no murmurs, rubs or gallops, PMI not laterally displaced GI- soft, NT, ND, + BS Extremities- no clubbing, cyanosis, or edema  ekg today reveals atrial fibrillation at 95 bpm.  Echo Left ventricle: The cavity size was normal. There was mild concentric hypertrophy. Systolic function was normal. The estimated ejection fraction was in the range of 55% to 60%. Wall motion was normal;  there were no regional wall motion abnormalities. The study was not technically sufficient to allow evaluation of LV diastolic dysfunction due to atrial fibrillation. - Aortic root: The aortic root was normal in size. - Mitral valve: There was mild regurgitation. - Right ventricle: Systolic pressure was within the normal range. - Right atrium: The atrium was normal in size. - Tricuspid valve: There was mild regurgitation. - Pulmonic valve: There was no regurgitation. - Pulmonary arteries: Systolic pressure was within the normal range. - Inferior vena cava: The vessel was normal in size. - Pericardium, extracardiac: There was no pericardial effusion. Left Atrium 40 mm  Assessment and Plan:  1. afib Return of afib Therapeutic strategies for afib including medicine and ablation were discussed in detail with the patient today. Risk, benefits, and alternatives to EP study and radiofrequency ablation for afib were also discussed in detail today. These risks include but are not limited to stroke, bleeding, vascular damage, tamponade, perforation, damage to the esophagus, lungs, and other structures, pulmonary vein stenosis, worsening renal function, and death. The patient understands these risk and wishes to proceed.  We will therefore proceed with catheter ablation once the patient gives [place] dates to proceed. Continue Tikosyn for now and thru first 3 months post ablation and then will discuss discontinuation. Continue apixaban until ablation and first 3 months with no missed doses  2. HTN Above goal States white coat hypertension. Does not want adjustment at this time.

## 2014-12-21 ENCOUNTER — Other Ambulatory Visit: Payer: Self-pay

## 2014-12-21 ENCOUNTER — Ambulatory Visit: Payer: Medicare Other | Admitting: Physician Assistant

## 2014-12-21 MED ORDER — DOFETILIDE 250 MCG PO CAPS: ORAL_CAPSULE | ORAL | Status: DC

## 2014-12-24 NOTE — Telephone Encounter (Signed)
SCHEDULED FOR ABLATION. PLEASE CHECK WITH DR. ALLRED ABOUT FURTHER REFILLS

## 2014-12-31 NOTE — Telephone Encounter (Signed)
New Message  Pt called states that the schedule mach 8th appt is not a god day. Please call back to resch surgery//sr

## 2014-12-31 NOTE — Telephone Encounter (Signed)
Pt aware Maria Arnold is not here today to reschedule her ablation.  She states the 15th, 22 or the 29th are all good days.

## 2015-01-01 ENCOUNTER — Ambulatory Visit: Payer: Medicare Other | Admitting: Physician Assistant

## 2015-01-01 NOTE — Telephone Encounter (Signed)
Left message for pt call back to reschedule ablation per her request

## 2015-01-03 NOTE — Telephone Encounter (Signed)
lmtcb

## 2015-01-08 ENCOUNTER — Other Ambulatory Visit: Payer: Medicare Other

## 2015-01-08 NOTE — Telephone Encounter (Signed)
Spoke with patient and she is going to see if the 29th is good for her husband

## 2015-01-14 ENCOUNTER — Other Ambulatory Visit: Payer: Self-pay

## 2015-01-14 DIAGNOSIS — I10 Essential (primary) hypertension: Secondary | ICD-10-CM

## 2015-01-14 DIAGNOSIS — I480 Paroxysmal atrial fibrillation: Secondary | ICD-10-CM

## 2015-01-14 MED ORDER — LOSARTAN POTASSIUM 25 MG PO TABS: 25.0000 mg | ORAL_TABLET | Freq: Every day | ORAL | Status: DC

## 2015-01-14 MED ORDER — APIXABAN 5 MG PO TABS: 5.0000 mg | ORAL_TABLET | Freq: Two times a day (BID) | ORAL | Status: DC

## 2015-01-14 MED ORDER — BISOPROLOL FUMARATE 5 MG PO TABS: 5.0000 mg | ORAL_TABLET | Freq: Every day | ORAL | Status: DC

## 2015-01-14 MED ORDER — DOFETILIDE 250 MCG PO CAPS: ORAL_CAPSULE | ORAL | Status: DC

## 2015-01-14 NOTE — Telephone Encounter (Signed)
[name], MD at 12/20/2014 10:13 PM apixaban (ELIQUIS) 5 MG TABS tabletTake 1 tablet (5 mg total) by mouth 2 (two) times daily bisoprolol (ZEBETA) 5 MG tabletTake 1 tablet (5 mg total) by mouth daily dofetilide (TIKOSYN) 250 MCG capsuleTAKE ONE CAPSULE BY MOUTH EVERY 12 HOURS losartan (COZAAR) 25 MG tabletTAKE 1 TABLET BY MOUTH EVERY DAY   Assessment and Plan:  1. afib Return of persistent afib despite medical therapy with tikosyn. Therapeutic strategies for afib including medicine and repeat ablation were discussed in detail with the patient today. Risk, benefits, and alternatives to EP study and radiofrequency ablation for afib were also discussed in detail today. These risks include but are not limited to stroke, bleeding, vascular damage, tamponade, perforation, damage to the esophagus, lungs, and other structures, pulmonary vein stenosis, radiation exposure, worsening renal function, and death. The patient understands these risk and wishes to proceed. We will therefore proceed with catheter ablation once the patient gives [place] dates to proceed. Continue Tikosyn  Continue apixaban

## 2015-01-15 ENCOUNTER — Other Ambulatory Visit: Payer: Self-pay

## 2015-01-15 ENCOUNTER — Encounter (HOSPITAL_COMMUNITY)
Admission: RE | Disposition: A | Discharge status: Home / Self Care | Payer: Self-pay | Source: Ambulatory Visit | Attending: Cardiology

## 2015-01-15 DIAGNOSIS — I10 Essential (primary) hypertension: Secondary | ICD-10-CM

## 2015-01-15 DIAGNOSIS — I480 Paroxysmal atrial fibrillation: Secondary | ICD-10-CM

## 2015-01-15 SURGERY — ATRIAL FIBRILLATION ABLATION
Anesthesia: Monitor Anesthesia Care

## 2015-01-15 MED ORDER — BISOPROLOL FUMARATE 5 MG PO TABS: 5.0000 mg | ORAL_TABLET | Freq: Every day | ORAL | Status: DC

## 2015-01-15 MED ORDER — LOSARTAN POTASSIUM 25 MG PO TABS: 25.0000 mg | ORAL_TABLET | Freq: Every day | ORAL | Status: DC

## 2015-01-15 MED ORDER — DOFETILIDE 250 MCG PO CAPS: ORAL_CAPSULE | ORAL | Status: DC

## 2015-01-15 MED ORDER — APIXABAN 5 MG PO TABS: 5.0000 mg | ORAL_TABLET | Freq: Two times a day (BID) | ORAL | Status: DC

## 2015-01-15 MED ORDER — APIXABAN 5 MG PO TABS
5.0000 mg | ORAL_TABLET | Freq: Two times a day (BID) | ORAL | Status: DC
Start: 2015-01-15 — End: 2015-12-27

## 2015-01-16 ENCOUNTER — Encounter: Payer: Self-pay | Admitting: *Deleted

## 2015-01-16 NOTE — Addendum Note (Signed)
Addended by: Janan Halter F on: 01/16/2015 01:43 PM   Modules accepted: Orders

## 2015-01-16 NOTE — Telephone Encounter (Signed)
She is now going to have her procedure on 02/12/15 both TEE and ablation on the same day.  Labs on 02/11/15 at 8am

## 2015-01-29 ENCOUNTER — Telehealth: Payer: Self-pay | Admitting: Cardiology

## 2015-01-29 NOTE — Telephone Encounter (Signed)
New Msg       Pt calling, states she is in Afib and will be having ablation soon.   Pt also has concerns about medications, prefers to speak directly to Dr. Claris Gladden nurse.  Please return call.

## 2015-01-29 NOTE — Telephone Encounter (Signed)
That would be fine, just enough to get to ablation.

## 2015-01-29 NOTE — Telephone Encounter (Signed)
Pt requesting refill of Valium 2mg  to use prn for sleep until she has ablation done 02/12/15.  Pt advised I will forward to Dr Aundra Dubin for review.

## 2015-02-07 NOTE — Telephone Encounter (Signed)
LMTCB

## 2015-02-07 NOTE — Telephone Encounter (Signed)
Follow up  ° ° ° °Returning call back to nurse  °

## 2015-02-07 NOTE — Telephone Encounter (Signed)
Pt advised I will send in prescription for Valium 2mg  #7 one tablet by mouth at bedtime as needed for sleep.

## 2015-02-11 ENCOUNTER — Other Ambulatory Visit (INDEPENDENT_AMBULATORY_CARE_PROVIDER_SITE_OTHER): Payer: Medicare Other | Admitting: *Deleted

## 2015-02-11 ENCOUNTER — Telehealth: Payer: Self-pay | Admitting: Internal Medicine

## 2015-02-11 DIAGNOSIS — I4891 Unspecified atrial fibrillation: Secondary | ICD-10-CM

## 2015-02-11 LAB — BASIC METABOLIC PANEL
BUN: 14 mg/dL (ref 6–23)
CO2: 30 mEq/L (ref 19–32)
Calcium: 9.8 mg/dL (ref 8.4–10.5)
Chloride: 101 mEq/L (ref 96–112)
Creatinine, Ser: 0.67 mg/dL (ref 0.40–1.20)
GFR: 92.79 mL/min (ref >60.00)
Glucose, Bld: 158 mg/dL — ABNORMAL HIGH (ref 70–99)
Potassium: 4.5 mEq/L (ref 3.5–5.1)
Sodium: 137 mEq/L (ref 135–145)

## 2015-02-11 LAB — CBC WITH DIFFERENTIAL/PLATELET
Basophils Absolute: 0 10*3/uL (ref 0.0–0.1)
Basophils Relative: 0.4 % (ref 0.0–3.0)
Eosinophils Absolute: 0.1 10*3/uL (ref 0.0–0.7)
Eosinophils Relative: 1.1 % (ref 0.0–5.0)
HCT: 45.1 % (ref 36.0–46.0)
Hemoglobin: 15.4 g/dL — ABNORMAL HIGH (ref 12.0–15.0)
Lymphocytes Relative: 24.3 % (ref 12.0–46.0)
Lymphs Abs: 1.8 10*3/uL (ref 0.7–4.0)
MCHC: 34 g/dL (ref 30.0–36.0)
MCV: 101.5 fl — ABNORMAL HIGH (ref 78.0–100.0)
Monocytes Absolute: 0.7 10*3/uL (ref 0.1–1.0)
Monocytes Relative: 9.9 % (ref 3.0–12.0)
Neutro Abs: 4.8 10*3/uL (ref 1.4–7.7)
Neutrophils Relative %: 64.3 % (ref 43.0–77.0)
Platelets: 304 10*3/uL (ref 150.0–400.0)
RBC: 4.45 Mil/uL (ref 3.87–5.11)
RDW: 13.8 % (ref 11.5–15.5)
WBC: 7.5 10*3/uL (ref 4.0–10.5)

## 2015-02-11 MED ORDER — SODIUM CHLORIDE 0.9 % IV SOLN
INTRAVENOUS | Status: DC
Start: 2015-02-11 — End: 2015-02-12

## 2015-02-11 MED ORDER — SODIUM CHLORIDE 0.9 % IV SOLN: INTRAVENOUS | Status: DC

## 2015-02-11 NOTE — Telephone Encounter (Signed)
New Message       Pt calling stating that Dr. Rayann Heman is doing her procedure tomorrow and she came and got labs done today and wants to make sure that her labs are ok and she will be able to get her procedure done. Please call back and advise.

## 2015-02-11 NOTE — Telephone Encounter (Addendum)
Labs are good and she is aware.  Still be at the hospital at 6:30am

## 2015-02-12 ENCOUNTER — Encounter (HOSPITAL_COMMUNITY)
Admission: RE | Disposition: A | Discharge status: Home / Self Care | Payer: Self-pay | Source: Ambulatory Visit | Attending: Cardiology

## 2015-02-12 ENCOUNTER — Ambulatory Visit (HOSPITAL_COMMUNITY)
Admission: RE | Admit: 2015-02-12 | Discharge: 2015-02-13 | Disposition: A | Discharge status: Home / Self Care | Payer: Medicare Other | Source: Ambulatory Visit | Attending: Cardiology | Admitting: Cardiology

## 2015-02-12 ENCOUNTER — Ambulatory Visit (HOSPITAL_COMMUNITY): Admission: RE | Admit: 2015-02-12 | Payer: Medicare Other | Source: Ambulatory Visit | Admitting: Internal Medicine

## 2015-02-12 ENCOUNTER — Ambulatory Visit (HOSPITAL_COMMUNITY): Payer: Medicare Other | Admitting: Anesthesiology

## 2015-02-12 ENCOUNTER — Encounter (HOSPITAL_COMMUNITY): Payer: Self-pay | Admitting: *Deleted

## 2015-02-12 DIAGNOSIS — I429 Cardiomyopathy, unspecified: Secondary | ICD-10-CM | POA: Insufficient documentation

## 2015-02-12 DIAGNOSIS — I480 Paroxysmal atrial fibrillation: Secondary | ICD-10-CM

## 2015-02-12 DIAGNOSIS — Z87891 Personal history of nicotine dependence: Secondary | ICD-10-CM | POA: Diagnosis not present

## 2015-02-12 DIAGNOSIS — I10 Essential (primary) hypertension: Secondary | ICD-10-CM | POA: Diagnosis not present

## 2015-02-12 DIAGNOSIS — I4891 Unspecified atrial fibrillation: Secondary | ICD-10-CM

## 2015-02-12 DIAGNOSIS — I481 Persistent atrial fibrillation: Secondary | ICD-10-CM | POA: Diagnosis present

## 2015-02-12 DIAGNOSIS — J45909 Unspecified asthma, uncomplicated: Secondary | ICD-10-CM | POA: Diagnosis not present

## 2015-02-12 DIAGNOSIS — I5022 Chronic systolic (congestive) heart failure: Secondary | ICD-10-CM

## 2015-02-12 HISTORY — PX: TEE WITHOUT CARDIOVERSION: SHX5443

## 2015-02-12 HISTORY — PX: ATRIAL FIBRILLATION ABLATION: SHX5456

## 2015-02-12 HISTORY — PX: ATRIAL FIBRILLATION ABLATION: SHX5732

## 2015-02-12 LAB — POCT ACTIVATED CLOTTING TIME
Activated Clotting Time: 263 seconds
Activated Clotting Time: 356 seconds
Activated Clotting Time: 300 seconds
Activated Clotting Time: 300 seconds
Activated Clotting Time: 153 seconds

## 2015-02-12 SURGERY — ATRIAL FIBRILLATION ABLATION
Anesthesia: Monitor Anesthesia Care

## 2015-02-12 SURGERY — ECHOCARDIOGRAM, TRANSESOPHAGEAL
Anesthesia: Moderate Sedation

## 2015-02-12 MED ORDER — HYDROCODONE-ACETAMINOPHEN 5-325 MG PO TABS: 1.0000 | ORAL_TABLET | ORAL | Status: DC | PRN

## 2015-02-12 MED ORDER — LOSARTAN POTASSIUM 25 MG PO TABS
25.0000 mg | ORAL_TABLET | Freq: Every day | ORAL | Status: DC
  Administered 2015-02-12 – 2015-02-13 (×2): 25 mg via ORAL
  Filled 2015-02-12 (×2): qty 1

## 2015-02-12 MED ORDER — PROTAMINE SULFATE 10 MG/ML IV SOLN
INTRAVENOUS | Status: DC | PRN
  Administered 2015-02-12: 30 mg via INTRAVENOUS

## 2015-02-12 MED ORDER — ONDANSETRON HCL 4 MG/2ML IJ SOLN: 4.0000 mg | Freq: Four times a day (QID) | INTRAMUSCULAR | Status: DC | PRN

## 2015-02-12 MED ORDER — SODIUM CHLORIDE 0.9 % IJ SOLN: 3.0000 mL | INTRAMUSCULAR | Status: DC | PRN

## 2015-02-12 MED ORDER — ONDANSETRON HCL 4 MG/2ML IJ SOLN
INTRAMUSCULAR | Status: DC | PRN
  Administered 2015-02-12: 4 mg via INTRAVENOUS

## 2015-02-12 MED ORDER — OXYCODONE HCL 5 MG/5ML PO SOLN: 5.0000 mg | Freq: Once | ORAL | Status: AC | PRN

## 2015-02-12 MED ORDER — PROPOFOL INFUSION 10 MG/ML OPTIME
INTRAVENOUS | Status: DC | PRN
  Administered 2015-02-12: 100 ug/kg/min via INTRAVENOUS

## 2015-02-12 MED ORDER — FENTANYL CITRATE 0.05 MG/ML IJ SOLN
INTRAMUSCULAR | Status: DC | PRN
  Administered 2015-02-12 (×2): 25 ug via INTRAVENOUS

## 2015-02-12 MED ORDER — MIDAZOLAM HCL 5 MG/5ML IJ SOLN
INTRAMUSCULAR | Status: DC | PRN
  Administered 2015-02-12: 2 mg via INTRAVENOUS

## 2015-02-12 MED ORDER — APIXABAN 5 MG PO TABS
5.0000 mg | ORAL_TABLET | Freq: Two times a day (BID) | ORAL | Status: DC
Start: 2015-02-12 — End: 2015-02-13
  Administered 2015-02-12 – 2015-02-13 (×2): 5 mg via ORAL
  Filled 2015-02-12 (×3): qty 1

## 2015-02-12 MED ORDER — TOBRAMYCIN-DEXAMETHASONE 0.3-0.1 % OP OINT
TOPICAL_OINTMENT | Freq: Every day | OPHTHALMIC | Status: DC
  Administered 2015-02-12: 22:00:00 via OPHTHALMIC
  Filled 2015-02-12: qty 3.5

## 2015-02-12 MED ORDER — HEPARIN SODIUM (PORCINE) 1000 UNIT/ML IJ SOLN
INTRAMUSCULAR | Status: AC
  Filled 2015-02-12: qty 1

## 2015-02-12 MED ORDER — ACETAMINOPHEN 325 MG PO TABS: 650.0000 mg | ORAL_TABLET | ORAL | Status: DC | PRN

## 2015-02-12 MED ORDER — SODIUM CHLORIDE 0.9 % IV SOLN
INTRAVENOUS | Status: DC | PRN
  Administered 2015-02-12 (×2): via INTRAVENOUS

## 2015-02-12 MED ORDER — BUPIVACAINE HCL (PF) 0.25 % IJ SOLN
INTRAMUSCULAR | Status: AC
  Filled 2015-02-12: qty 30

## 2015-02-12 MED ORDER — MIDAZOLAM HCL 10 MG/2ML IJ SOLN
INTRAMUSCULAR | Status: DC | PRN
  Administered 2015-02-12: 1 mg via INTRAVENOUS
  Administered 2015-02-12 (×2): 2 mg via INTRAVENOUS

## 2015-02-12 MED ORDER — SODIUM CHLORIDE 0.9 % IV SOLN
INTRAVENOUS | Status: DC
  Administered 2015-02-12: 500 mL via INTRAVENOUS

## 2015-02-12 MED ORDER — ALBUMIN HUMAN 5 % IV SOLN
INTRAVENOUS | Status: DC | PRN
  Administered 2015-02-12: 12:00:00 via INTRAVENOUS

## 2015-02-12 MED ORDER — FENTANYL CITRATE 0.05 MG/ML IJ SOLN
INTRAMUSCULAR | Status: DC | PRN
  Administered 2015-02-12 (×2): 50 ug via INTRAVENOUS
  Administered 2015-02-12 (×3): 25 ug via INTRAVENOUS

## 2015-02-12 MED ORDER — BUTAMBEN-TETRACAINE-BENZOCAINE 2-2-14 % EX AERO
INHALATION_SPRAY | CUTANEOUS | Status: DC | PRN
  Administered 2015-02-12: 2 via TOPICAL

## 2015-02-12 MED ORDER — DOFETILIDE 250 MCG PO CAPS
250.0000 ug | ORAL_CAPSULE | Freq: Two times a day (BID) | ORAL | Status: DC
  Administered 2015-02-13: 09:00:00 250 ug via ORAL
  Filled 2015-02-12 (×2): qty 1

## 2015-02-12 MED ORDER — FENTANYL CITRATE 0.05 MG/ML IJ SOLN: 25.0000 ug | INTRAMUSCULAR | Status: DC | PRN

## 2015-02-12 MED ORDER — OXYCODONE HCL 5 MG PO TABS: 5.0000 mg | ORAL_TABLET | Freq: Once | ORAL | Status: AC | PRN

## 2015-02-12 MED ORDER — BISOPROLOL FUMARATE 5 MG PO TABS
5.0000 mg | ORAL_TABLET | Freq: Every day | ORAL | Status: DC
  Administered 2015-02-13: 5 mg via ORAL
  Filled 2015-02-12: qty 1

## 2015-02-12 MED ORDER — SODIUM CHLORIDE 0.9 % IV SOLN: 250.0000 mL | INTRAVENOUS | Status: DC | PRN

## 2015-02-12 MED ORDER — HEPARIN SODIUM (PORCINE) 1000 UNIT/ML IJ SOLN
INTRAMUSCULAR | Status: DC | PRN
  Administered 2015-02-12: 4000 [IU] via INTRAVENOUS
  Administered 2015-02-12: 2000 [IU] via INTRAVENOUS

## 2015-02-12 MED ORDER — DIAZEPAM 2 MG PO TABS
2.0000 mg | ORAL_TABLET | Freq: Every evening | ORAL | Status: DC | PRN
  Administered 2015-02-12: 2 mg via ORAL
  Filled 2015-02-12: qty 1

## 2015-02-12 MED ORDER — MIDAZOLAM HCL 5 MG/ML IJ SOLN
INTRAMUSCULAR | Status: AC
  Filled 2015-02-12: qty 2

## 2015-02-12 MED ORDER — FENTANYL CITRATE 0.05 MG/ML IJ SOLN
INTRAMUSCULAR | Status: AC
  Filled 2015-02-12: qty 2

## 2015-02-12 MED ORDER — SODIUM CHLORIDE 0.9 % IJ SOLN: 3.0000 mL | Freq: Two times a day (BID) | INTRAMUSCULAR | Status: DC

## 2015-02-12 NOTE — H&P (Signed)
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 Since her afib ablation 11/13, the patient reports doing very well until December of this year when she took Doxycycline for URI. She thinks she has had persistent afib since that time.  She has also had hair loss and is concerned that it may be medication related. She would like to stop tikosyn if possible. Recent thyroid panel was normal. Continues on apixiban for chadsvasc score of 4.   Today, she denies symptoms of palpitations, chest pain, shortness of breath, lower extremity edema, dizziness, presyncope, or syncope. The patient is otherwise without complaint today.   Past Medical History  Diagnosis Date  . Cardiomyopathy     Nonischemic. LHC (2/12): No coronary disease, EF 30-35% with periapical severe hypokinesis. Echo (2/12): EF 30-35%, periapical severe hypokinesis, normal RV, mild to moderate MR. 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   . Asthma   . HTN (hypertension)   . Foot pain     From bone spur  . Back pain     Low back  . PAF (paroxysmal atrial fibrillation)     with rapid ventricular response. She is on coumadin and dofetilide. She has had some breakthrough atrial fib on dofetilide.   Past Surgical History  Procedure Laterality Date  . Nasal sinus surgery    . Dilation and curettage of uterus    . Laparoscopic abdominal exploration    . Tee without cardioversion  09/21/2012    Procedure: TRANSESOPHAGEAL ECHOCARDIOGRAM (TEE); Surgeon: Thayer Headings, MD; Location: [facility] ENDOSCOPY; Service: Cardiovascular; Laterality: N/A;  . Atrial fibrillation ablation N/A 09/22/2012    Procedure: ATRIAL FIBRILLATION ABLATION; Surgeon:  [name], MD; Location: [facility] CATH LAB; Service: Cardiovascular; Laterality: N/A;    Current Outpatient Prescriptions  Medication Sig Dispense Refill  . amoxicillin-clavulanate (AUGMENTIN) 875-125 MG per tablet Take 1 tablet by mouth 2 (two) times daily.    Marland Kitchen apixaban (ELIQUIS) 5 MG TABS tablet Take 1 tablet (5 mg total) by mouth 2 (two) times daily. 60 tablet 10  . Ascorbic Acid (VITAMIN C) 500 MG tablet Take 500 mg by mouth daily.     . bisoprolol (ZEBETA) 5 MG tablet Take 1 tablet (5 mg total) by mouth daily. 90 tablet 3  . Calcium-Magnesium-Zinc (CAL-MAG-ZINC PO) Take 1 tablet by mouth daily.     . Cyanocobalamin (VITAMIN B-12 CR PO) Take 3 tablets by mouth daily.     Marland Kitchen dofetilide (TIKOSYN) 250 MCG capsule TAKE ONE CAPSULE BY MOUTH EVERY 12 HOURS 60 capsule 1  . losartan (COZAAR) 25 MG tablet TAKE 1 TABLET BY MOUTH EVERY DAY 30 tablet 10  . Multiple Vitamin (MULTIVITAMIN WITH MINERALS) TABS Take 1 tablet by mouth daily.    Marland Kitchen albuterol (PROVENTIL HFA;VENTOLIN HFA) 108 (90 BASE) MCG/ACT inhaler Inhale 2 puffs into the lungs every 6 (six) hours as needed. Shortness of breath or wheezing     No current facility-administered medications for this visit.    Physical Exam: Filed Vitals:   02/12/15 0950  BP:   Pulse: 85  Temp:   Resp: 13   GEN- The patient is well appearing, alert and oriented x 3 today.  Head- normocephalic, atraumatic Eyes- Sclera clear, conjunctiva pink Ears- hearing intact Oropharynx- clear Lungs- Clear to ausculation bilaterally, normal work of  breathing Heart- Irregular rate and rhythm, no murmurs, rubs or gallops, PMI not laterally displaced GI- soft, NT, ND, + BS Extremities- no clubbing, cyanosis, or edema   Echo Left ventricle: The cavity size was normal. There was mild concentric hypertrophy. Systolic function was normal. The estimated ejection fraction was in the range of 55% to  60%. Wall motion was normal; there were no regional wall motion abnormalities. The study was not technically sufficient to allow evaluation of LV diastolic dysfunction due to atrial fibrillation. - Aortic root: The aortic root was normal in size. - Mitral valve: There was mild regurgitation. - Right ventricle: Systolic pressure was within the normal range. - Right atrium: The atrium was normal in size. - Tricuspid valve: There was mild regurgitation. - Pulmonic valve: There was no regurgitation. - Pulmonary arteries: Systolic pressure was within the normal range. - Inferior vena cava: The vessel was normal in size. - Pericardium, extracardiac: There was no pericardial effusion. Left Atrium 40 mm  TEE today is reviewed  Assessment and Plan:  1. afib Return of persistent afib despite medical therapy with tikosyn. Therapeutic strategies for afib including medicine and repeat ablation were discussed in detail with the patient today. Risk, benefits, and alternatives to EP study and radiofrequency ablation for afib were also discussed in detail today. These risks include but are not limited to stroke, bleeding, vascular damage, tamponade, perforation, damage to the esophagus, lungs, and other structures, pulmonary vein stenosis, radiation exposure, worsening renal function, and death. The patient understands these risk and wishes to proceed.  chads2vasc score is at least 4 :)

## 2015-02-12 NOTE — Anesthesia Preprocedure Evaluation (Addendum)
Anesthesia Evaluation  Patient identified by MRN, date of birth, ID band Patient awake    Reviewed: Allergy & Precautions, NPO status , Patient's Chart, lab work & pertinent test results  Airway Mallampati: II   Neck ROM: full    Dental   Pulmonary asthma , former smoker,          Cardiovascular hypertension,     Neuro/Psych    GI/Hepatic   Endo/Other    Renal/GU      Musculoskeletal   Abdominal   Peds  Hematology   Anesthesia Other Findings   Reproductive/Obstetrics                            Anesthesia Physical Anesthesia Plan  ASA: III  Anesthesia Plan: MAC   Post-op Pain Management:    Induction: Intravenous  Airway Management Planned: Simple Face Mask  Additional Equipment:   Intra-op Plan:   Post-operative Plan:   Informed Consent: I have reviewed the patients History and Physical, chart, labs and discussed the procedure including the risks, benefits and alternatives for the proposed anesthesia with the patient or authorized representative who has indicated his/her understanding and acceptance.     Plan Discussed with: CRNA, Anesthesiologist and Surgeon  Anesthesia Plan Comments:         Anesthesia Quick Evaluation

## 2015-02-12 NOTE — Discharge Summary (Signed)
ELECTROPHYSIOLOGY PROCEDURE DISCHARGE SUMMARY    Patient ID: Maria Arnold,  MRN: [id number], DOB/AGE: Jun 08, 1946 69 y.o.  Admit date: 02/12/2015 Discharge date: 02/13/2015  Primary Care Physician: Vickii Penna., MD Primary Cardiologist: Aundra Dubin Electrophysiologist: [name], MD  Primary Discharge Diagnosis:  Persistent atrial fibrillation status post PVI this admission  Secondary Discharge Diagnosis:  1. Prior non-ischemic cardiomyopathy - resolved 2.  Hypertension 3.  Asthma  Procedures This Admission:  1.  Electrophysiology study and radiofrequency catheter ablation on 02/12/15 by Dr [name].  This study demonstrated atrial fibrillation upon presentation; rotational Angiography reveals a moderate sized left atrium with four separate pulmonary veins without evidence of pulmonary vein stenosis; there was prodigious electrical activity within the right inferior pulmonary vein at baseline. There was trivial conduction within the right superior pulmonary vein. The left superior and left inferior pulmonary pulmonary veins were quiescent from the prior ablation procedure; successful electrical isolation and anatomical encircling of the right superior and inferior pulmonary veins. No ablation was required within the left sided pulmonary veins; CFAEs were identified and ablated along the roof of the left atrium, base of the left atrial appendage (LAA), along the ridge between the left superior pulmonary vein and the left atrial appendage, the interatrial septum, along the lateral wall of the left atrium, and above the coronary sinus; atrial fibrillation successfully cardioverted to sinus rhythm. There were no early apparent complications.    Brief HPI: Maria Arnold is a 69 y.o. female with a history of persistent atrial fibrillation.   She underwent PVI in 2013 and did well initially but has since developed recurrent persistent atrial fibrillation.  She has  failed medical therapy  with Tikosyn. Risks, benefits, and alternatives to catheter ablation of atrial fibrillation were reviewed with the patient who wished to proceed.  The patient underwent TEE prior to the procedure which demonstrated normal LV function and no LAA thrombus.    Hospital Course:  The patient was admitted and underwent EPS/RFCA of atrial fibrillation with details as outlined above.  They were monitored on telemetry overnight which demonstrated sinus with conversion to afib.  She was observed on IV diltiazem overnight.  Zibeta was increased to twice daily at discharge.  Groin was without complication on the day of discharge.  The patient was examined and considered to be stable for discharge.  Wound care and restrictions were reviewed with the patient.  The patient will be seen back by Dr Rayann Heman in 12 weeks for post ablation follow up.   Physical Exam: Filed Vitals:   02/13/15 0400 02/13/15 0500 02/13/15 0600 02/13/15 0823  BP: 143/76 133/80 129/62 132/63  Pulse:    97  Temp:    98.1 F (36.7 C)  TempSrc:    Oral  Resp: 15 22 16 16   Weight:      SpO2:    98%    GEN- The patient is well appearing, alert and oriented x 3 today.   HEENT: normocephalic, atraumatic; sclera clear, conjunctiva pink; hearing intact; oropharynx clear; neck supple, no JVP Lymph- no cervical lymphadenopathy Lungs- Clear to ausculation bilaterally, normal work of breathing.  No wheezes, rales, rhonchi Heart- irregular rate and rhythm, no murmurs, rubs or gallops, PMI not laterally displaced GI- soft, non-tender, non-distended, bowel sounds present, no hepatosplenomegaly Extremities- no clubbing, cyanosis, or edema; DP/PT/radial pulses 2+ bilaterally, groin without hematoma/bruit MS- no significant deformity or atrophy Skin- warm and dry, no rash or lesion Psych- euthymic mood, full affect Neuro- strength  and sensation are intact   Labs:   Lab Results  Component Value Date   WBC 7.5 02/11/2015   HGB 15.4* 02/11/2015     HCT 45.1 02/11/2015   MCV 101.5* 02/11/2015   PLT 304.0 02/11/2015     Recent Labs Lab 02/13/15 0341  NA 139  K 3.8  CL 108  CO2 24  BUN 9  CREATININE 0.46*  CALCIUM 8.4  GLUCOSE 115*     Discharge Medications:    Medication List    TAKE these medications        apixaban 5 MG Tabs tablet  Commonly known as:  ELIQUIS  Take 1 tablet (5 mg total) by mouth 2 (two) times daily.     bisoprolol 5 MG tablet  Commonly known as:  ZEBETA  Take 1 tablet (5 mg total) by mouth 2 (two) times daily.     CAL-MAG-ZINC PO  Take 1 tablet by mouth daily.     diazepam 2 MG tablet  Commonly known as:  VALIUM  Take 2 mg by mouth at bedtime as needed (sleep).     dofetilide 250 MCG capsule  Commonly known as:  TIKOSYN  TAKE ONE CAPSULE BY MOUTH EVERY 12 HOURS     losartan 25 MG tablet  Commonly known as:  COZAAR  Take 1 tablet (25 mg total) by mouth daily.     multivitamin with minerals Tabs tablet  Take 1 tablet by mouth daily.     pantoprazole 40 MG tablet  Commonly known as:  PROTONIX  Take 1 tablet (40 mg total) by mouth daily.     TOBRADEX ophthalmic ointment  Generic drug:  tobramycin-dexamethasone  Place 1 drop into both eyes at bedtime.     VITAMIN B-12 CR PO  Take 3 tablets by mouth daily.     vitamin C 500 MG tablet  Commonly known as:  ASCORBIC ACID  Take 500 mg by mouth daily.        Disposition:  Discharge Instructions    Diet - low sodium heart healthy    Complete by:  As directed      Increase activity slowly    Complete by:  As directed           Follow-up Information    Follow up with [name], MD In 3 months.   Specialty:  Cardiology   Why:  the office will call to schedule   Contact information:   [address] [place] [address] [phone]     Will also need follow-up in 1 week in the AF clinic with Roderic Palau NP  Duration of Discharge Encounter: Greater than 30 minutes including physician  time.  Army Fossa MD  02/13/2015 8:45 AM

## 2015-02-12 NOTE — H&P (Signed)
Expand All Collapse All   PCP: Vickii Penna., MD Primary Cardiologist: Dr Lana Fish is a 69 y.o. female who presents today for electrophysiology evaluation for consideration of repeat ablation. Since her afib ablation 11/13, the patient reports doing very well until December of this year when she took Doxycycline for URI. She thinks she has had persistent afib since that time.  She has also had hair loss and is concerned that it may be medication related. She would like to stop tikosyn if possible. Recent thyroid panel was normal. Continues on apixiban for chadsvasc score of 4.   Today, she denies symptoms of palpitations, chest pain, shortness of breath, lower extremity edema, dizziness, presyncope, or syncope. The patient is otherwise without complaint today.   Past Medical History  Diagnosis Date  . Cardiomyopathy     Nonischemic. LHC (2/12): No coronary disease, EF 30-35% with periapical severe hypokinesis. Echo (2/12): EF 30-35%, periapical severe hypokinesis, normal RV, mild to moderate MR. 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   . Asthma   . HTN (hypertension)   . Foot pain     From bone spur  . Back pain     Low back  . PAF (paroxysmal atrial fibrillation)     with rapid ventricular response. She is on coumadin and dofetilide. She has had some breakthrough atrial fib on dofetilide.   Past Surgical History  Procedure Laterality Date  . Nasal sinus surgery    . Dilation and curettage of uterus    . Laparoscopic abdominal exploration    . Tee without cardioversion  09/21/2012    Procedure: TRANSESOPHAGEAL ECHOCARDIOGRAM (TEE); Surgeon: Thayer Headings, MD; Location[facility] ENDOSCOPY; Service: Cardiovascular; Laterality: N/A;  . Atrial fibrillation ablation N/A 09/22/2012    Procedure: ATRIAL  FIBRILLATION ABLATION; Surgeon: [name], MD; Location: [facility] CATH LAB; Service: Cardiovascular; Laterality: N/A;    Current Outpatient Prescriptions  Medication Sig Dispense Refill  . amoxicillin-clavulanate (AUGMENTIN) 875-125 MG per tablet Take 1 tablet by mouth 2 (two) times daily.    Marland Kitchen apixaban (ELIQUIS) 5 MG TABS tablet Take 1 tablet (5 mg total) by mouth 2 (two) times daily. 60 tablet 10  . Ascorbic Acid (VITAMIN C) 500 MG tablet Take 500 mg by mouth daily.     . bisoprolol (ZEBETA) 5 MG tablet Take 1 tablet (5 mg total) by mouth daily. 90 tablet 3  . Calcium-Magnesium-Zinc (CAL-MAG-ZINC PO) Take 1 tablet by mouth daily.     . Cyanocobalamin (VITAMIN B-12 CR PO) Take 3 tablets by mouth daily.     Marland Kitchen dofetilide (TIKOSYN) 250 MCG capsule TAKE ONE CAPSULE BY MOUTH EVERY 12 HOURS 60 capsule 1  . losartan (COZAAR) 25 MG tablet TAKE 1 TABLET BY MOUTH EVERY DAY 30 tablet 10  . Multiple Vitamin (MULTIVITAMIN WITH MINERALS) TABS Take 1 tablet by mouth daily.    Marland Kitchen albuterol (PROVENTIL HFA;VENTOLIN HFA) 108 (90 BASE) MCG/ACT inhaler Inhale 2 puffs into the lungs every 6 (six) hours as needed. Shortness of breath or wheezing     No current facility-administered medications for this visit.    Physical Exam: Filed Vitals:   12/20/14 1046  BP: 140/100  Pulse: 95  Height: 5\' 2"  (1.575 m)  Weight: 153 lb 3.2 oz (69.491 kg)    GEN- The patient is well appearing, alert and oriented x 3 today.  Head- normocephalic, atraumatic Eyes- Sclera clear, conjunctiva pink Ears- hearing intact  Oropharynx- clear Lungs- Clear to ausculation bilaterally, normal work of breathing Heart- Irregular rate and rhythm, no murmurs, rubs or gallops, PMI not laterally displaced GI- soft, NT, ND, + BS Extremities- no clubbing, cyanosis, or edema  ekg today reveals atrial fibrillation at 95 bpm.  Echo Left ventricle: The cavity size was  normal. There was mild concentric hypertrophy. Systolic function was normal. The estimated ejection fraction was in the range of 55% to 60%. Wall motion was normal; there were no regional wall motion abnormalities. The study was not technically sufficient to allow evaluation of LV diastolic dysfunction due to atrial fibrillation. - Aortic root: The aortic root was normal in size. - Mitral valve: There was mild regurgitation. - Right ventricle: Systolic pressure was within the normal range. - Right atrium: The atrium was normal in size. - Tricuspid valve: There was mild regurgitation. - Pulmonic valve: There was no regurgitation. - Pulmonary arteries: Systolic pressure was within the normal range. - Inferior vena cava: The vessel was normal in size. - Pericardium, extracardiac: There was no pericardial effusion. Left Atrium 40 mm  Assessment and Plan:  1. afib for TEE this am to follow repeat ablation with Dr Rayann Heman for recurrent afib Has had TEE/DCC with Dr Acie Fredrickson in 2013    2. HTN Above goal States white coat hypertension. Does not want adjustment at this time.  3. Chronic systolic dysfunction Echo today is reviewed and reveals normalized EF No changes today

## 2015-02-12 NOTE — Op Note (Addendum)
SURGEON:  [name], MD Assistants:  Dr Burt Knack, Dr Angelena Form  PREPROCEDURE DIAGNOSES: 1. Persistent atrial fibrillation.  POSTPROCEDURE DIAGNOSES: 1. Persistent atrial fibrillation.  PROCEDURES: 1. Comprehensive electrophysiologic study. 2. Coronary sinus pacing and recording. 3. Three-dimensional mapping of atrial fibrillation (with additional mapping and ablation within the left atrium) 4. Ablation of atrial fibrillation (with additional mapping and ablation within the left atrium) 5. Intracardiac echocardiography. 6. Transseptal puncture of an intact septum. 7. Rotational Angiography with processing at an independent workstation 8. Arrhythmia induction with pacing 9.External cardioversion.  INTRODUCTION:  Maria Arnold is a 69 y.o. female with a history of persistent atrial fibrillation who now presents for EP study and radiofrequency ablation.  The patient reports initially being diagnosed with atrial fibrillation after presenting with symptomatic palpitations and fatgiue.  The patient has failed medical therapy with Tikosyn.  She underwent atrial fibrillation ablation by me in 2013.  She did very well initially post ablation.  Unfortunately, she has developed recurrent symptomatic atrial fibrillation.  The patient therefore presents today for catheter ablation of atrial fibrillation.  DESCRIPTION OF PROCEDURE:  Informed written consent was obtained, and the patient was brought to the electrophysiology lab in a fasting state.  The patient was adequately sedated with intravenous medications as outlined in the anesthesia report.  The patient's left and right groins were prepped and draped in the usual sterile fashion by the EP lab staff.  Using a percutaneous Seldinger technique, two 7-French and one 11-French hemostasis sheaths were placed into the right common femoral vein.    3 Dimensional Rotational Angiography: A 5 french pigtail catheter was introduced through the right common  femoral vein and advanced into the inferior venocava.  3 demential rotational angiography was then performed by power injection of 100cc of nonionic contrast.  Reprocessing at an independent work station was then performed.   This demonstrated a moderate sized left atrium with 4 separate pulmonary veins which were small in size.  There were no anomalous veins or significant abnormalities.  A 3 dimensional rendering of the left atrium was then merged using [employer] onto the [employer] system and registered with intracardiac echo (see below).  The pigtail catheter was then removed.  Catheter Placement:  A 7-French [employer] Decapolar coronary sinus catheter was introduced through the right common femoral vein and advanced into the coronary sinus for recording and pacing from this location.  A quadripolar catheter was introduced through the right common femoral vein and advanced into the right ventricle for recording and pacing.  This catheter was then pulled back to the His bundle location.    Initial Measurements: The patient presented to the electrophysiology lab in atrial fibrillation.    The HV interval 39 msec.     Intracardiac Echocardiography: A 10-French [employer] Webster AcuNav intracardiac echocardiography catheter was introduced through the left common femoral vein and advanced into the right atrium. Intracardiac echocardiography was performed of the left atrium, and a three-dimensional anatomical rendering of the left atrium was performed using CARTO sound technology.  The patient was noted to have a moderate sized left atrium.  The interatrial septum was prominent but not aneurysmal. All 4 pulmonary veins were visualized and noted to have separate ostia.  The pulmonary veins were small in size.  The left atrial appendage was visualized and did not reveal thrombus.   There was no evidence of pulmonary vein stenosis.   Transseptal Puncture: The middle right common femoral  vein sheath was exchanged for an  8.5 [place] SL2 transseptal sheath and transseptal access was achieved in a standard fashion using a Brockenbrough needle under biplane fluoroscopy with intracardiac echocardiography confirmation of the transseptal puncture.  Once transseptal access had been achieved, heparin was administered intravenously and intra- arterially in order to maintain an ACT of greater than 350 seconds throughout the procedure.   3D Mapping and Ablation: The His bundle catheter was removed and in its place a 3.5 mm [employer] Thermocool ablation catheter was advanced into the right atrium.  The transseptal sheath was pulled back into the IVC over a guidewire.  The ablation catheter was advanced across the transseptal hole using the wire as a guide.  The transseptal sheath was then re-advanced over the guidewire into the left atrium.  A duodecapolar Biosense Webster circular mapping catheter was introduced through the transseptal sheath and positioned over the mouth of all 4 pulmonary veins.  Three-dimensional electroanatomical mapping was performed using CARTO technology.  This demonstrated prodigious electrical activity within the right inferior pulmonary vein at baseline.  There was trivial conduction within the right superior pulmonary vein.  The left superior and left inferior pulmonary pulmonary veins were quiescent from the prior ablation procedure. The patient underwent successful sequential electrical isolation and anatomical encircling of the right superior and inferior pulmonary veins using radiofrequency current with a circular mapping catheter as a guide.  No ablation was required within the left sided pulmonary veins. Complex fractionated electrograms (CFAEs) were then identified and ablated along the roof of the left atrium, base of the left atrial appendage (LAA),  along the ridge between the left superior pulmonary vein and the left atrial appendage, the interatrial  septum, along the lateral wall of the left atrium, and above the coronary sinus.    Cardioversion: The patient was then cardioverted to sinus rhythm with a single synchronized 200-J biphasic shock with cardioversion electrodes in the anterior-posterior thoracic configuration. She maintained sinus rhythm thereafter.  Measurements Following Ablation: In sinus rhythm the RR interval was 728msec, with PR 185 msec, QRS 100 msec, and Qtc 364 msec.  Following ablation the AH interval measured 89 msec with an HV interval of 42 msec. Ventricular pacing was performed, which revealed midline decremental VA conduction with a VA Wenckebach cycle length of 420 msec.  Rapid atrial pacing was performed, which revealed an AV Wenckebach cycle length of 350 msec.  Electroisolation was then again confirmed in all four pulmonary veins. Entrance and exit block was confirmed within the pulmonary veins. The procedure was therefore considered completed.  All catheters were removed, and the sheaths were aspirated and flushed.  The patient was transferred to the recovery area for sheath removal per protocol.  A limited bedside transthoracic echocardiogram revealed no pericardial effusion. EBL<59ml.  There were no early apparent complications.  CONCLUSIONS: 1. Atrial fibrillation upon presentation.   2. Rotational Angiography reveals a moderate sized left atrium with four separate pulmonary veins without evidence of pulmonary vein stenosis. 3. There was prodigious electrical activity within the right inferior pulmonary vein at baseline.  There was trivial conduction within the right superior pulmonary vein.  The left superior and left inferior pulmonary pulmonary veins were quiescent from the prior ablation procedure.   4.  Successful electrical isolation and anatomical encircling of the right superior and inferior pulmonary veins.  No ablation was required within the left sided pulmonary veins. 5. CFAEs were identified and  ablated along the roof of the left atrium, base of the left atrial appendage (LAA),  along the ridge between the left superior pulmonary vein and the left atrial appendage, the interatrial septum, along the lateral wall of the left atrium, and above the coronary sinus.   6. Atrial fibrillation successfully cardioverted to sinus rhythm. 7. No early apparent complications.   Cydni Reddoch,MD 2:46 PM 02/12/2015

## 2015-02-12 NOTE — Progress Notes (Signed)
  Echocardiogram Echocardiogram Transesophageal has been performed.  Maria Arnold 02/12/2015, 8:39 AM

## 2015-02-12 NOTE — Anesthesia Postprocedure Evaluation (Signed)
  Anesthesia Post-op Note  Patient: Maria Arnold  Procedure(s) Performed: Procedure(s): ATRIAL FIBRILLATION ABLATION (N/A)  Patient Location: PACU  Anesthesia Type:MAC  Level of Consciousness: awake, alert  and oriented  Airway and Oxygen Therapy: Patient Spontanous Breathing and Patient connected to nasal cannula oxygen  Post-op Pain: none  Post-op Assessment: Post-op Vital signs reviewed, Patient's Cardiovascular Status Stable, Respiratory Function Stable, Patent Airway and No signs of Nausea or vomiting  Post-op Vital Signs: Reviewed and stable  Last Vitals:  Filed Vitals:   02/12/15 0950  BP:   Pulse: 85  Temp:   Resp: 13    Complications: No apparent anesthesia complications

## 2015-02-12 NOTE — CV Procedure (Signed)
TEE:  5 mg versed 50ug fentanyl  Normal EF 60% Mild MR Moderate LAE Moderate LAA enlargement no thrombus No ASD/PFO Normal RV Normal AV No pericardial effusion Moderate mobile debris in descending thoracic aorta  Jenkins Rouge

## 2015-02-12 NOTE — Progress Notes (Signed)
Site area: right groin a 11, 7 and 9 french venous sheath was removed  Site Prior to Removal:  Level 0  Pressure Applied For 15 MINUTES    Minutes Beginning at 1520p  Manual:   Yes.    Patient Status During Pull:  stable  Post Pull Groin Site:  Level 0  Post Pull Instructions Given:  Yes.    Post Pull Pulses Present:  Yes.    Dressing Applied:  Yes.    Comments:  VS remain stable during sheath pull.  Pt denies any discomfort at site at this time.

## 2015-02-12 NOTE — Transfer of Care (Signed)
Immediate Anesthesia Transfer of Care Note  Patient: Maria Arnold  Procedure(s) Performed: Procedure(s): ATRIAL FIBRILLATION ABLATION (N/A)  Patient Location: Cath Lab  Anesthesia Type:MAC  Level of Consciousness: awake, alert  and oriented  Airway & Oxygen Therapy: Patient Spontanous Breathing and Patient connected to nasal cannula oxygen  Post-op Assessment: Report given to RN and Post -op Vital signs reviewed and stable  Post vital signs: Reviewed and stable  Last Vitals:  Filed Vitals:   02/12/15 0950  BP:   Pulse: 85  Temp:   Resp: 13    Complications: No apparent anesthesia complications

## 2015-02-12 NOTE — Discharge Instructions (Signed)
No driving for 5 days. No lifting over 5 lbs for 1 week. No sexual activity for 1 week.  Keep procedure site clean & dry. If you notice increased pain, swelling, bleeding or pus, call/return!  You may shower, but no soaking baths/hot tubs/pools for 1 week.

## 2015-02-13 ENCOUNTER — Encounter (HOSPITAL_COMMUNITY): Payer: Self-pay | Admitting: Cardiovascular Disease

## 2015-02-13 DIAGNOSIS — J45909 Unspecified asthma, uncomplicated: Secondary | ICD-10-CM | POA: Diagnosis not present

## 2015-02-13 DIAGNOSIS — I429 Cardiomyopathy, unspecified: Secondary | ICD-10-CM | POA: Diagnosis not present

## 2015-02-13 DIAGNOSIS — I480 Paroxysmal atrial fibrillation: Secondary | ICD-10-CM | POA: Diagnosis not present

## 2015-02-13 DIAGNOSIS — I10 Essential (primary) hypertension: Secondary | ICD-10-CM | POA: Diagnosis not present

## 2015-02-13 DIAGNOSIS — I481 Persistent atrial fibrillation: Secondary | ICD-10-CM | POA: Diagnosis not present

## 2015-02-13 LAB — BASIC METABOLIC PANEL
Anion gap: 7 (ref 5–15)
BUN: 9 mg/dL (ref 6–23)
CO2: 24 mmol/L (ref 19–32)
Calcium: 8.4 mg/dL (ref 8.4–10.5)
Chloride: 108 mmol/L (ref 96–112)
Creatinine, Ser: 0.46 mg/dL — ABNORMAL LOW (ref 0.50–1.10)
GFR calc Af Amer: >90 mL/min (ref >90)
GFR calc non Af Amer: >90 mL/min (ref >90)
Glucose, Bld: 115 mg/dL — ABNORMAL HIGH (ref 70–99)
Potassium: 3.8 mmol/L (ref 3.5–5.1)
Sodium: 139 mmol/L (ref 135–145)

## 2015-02-13 LAB — MAGNESIUM: Magnesium: 1.8 mg/dL (ref 1.5–2.5)

## 2015-02-13 MED ORDER — DILTIAZEM HCL 100 MG IV SOLR
5.0000 mg/h | INTRAVENOUS | Status: DC
  Administered 2015-02-13: 5 mg/h via INTRAVENOUS
  Filled 2015-02-13 (×2): qty 100

## 2015-02-13 MED ORDER — PANTOPRAZOLE SODIUM 40 MG PO TBEC: 40.0000 mg | DELAYED_RELEASE_TABLET | Freq: Every day | ORAL | Status: DC

## 2015-02-13 MED ORDER — OFF THE BEAT BOOK
Freq: Once | Status: AC
  Administered 2015-02-13: 03:00:00
  Filled 2015-02-13: qty 1

## 2015-02-13 MED ORDER — ATORVASTATIN CALCIUM 20 MG PO TABS
20.0000 mg | ORAL_TABLET | Freq: Every day | ORAL | Status: DC
  Filled 2015-02-13: qty 1

## 2015-02-13 MED ORDER — BISOPROLOL FUMARATE 5 MG PO TABS: 5.0000 mg | ORAL_TABLET | Freq: Two times a day (BID) | ORAL | Status: DC

## 2015-02-13 MED ORDER — DILTIAZEM HCL ER COATED BEADS 240 MG PO CP24
240.0000 mg | ORAL_CAPSULE | Freq: Every day | ORAL | Status: DC
  Administered 2015-02-13: 240 mg via ORAL
  Filled 2015-02-13: qty 1

## 2015-02-13 NOTE — Progress Notes (Signed)
Cardizem began at 0200 at 5mg /hr. HR 120's-130's, Afib, BP 168/91 (pt very anxious). No chest pain. Cardizem now at 10mg /hr afib with HR 102-117 and BP 139/82. No chest pain, just very nervous, anxious. Continuing to monitor closely.

## 2015-02-13 NOTE — Progress Notes (Signed)
Pt still in afib with HR 85-90's. Cardizem drip  at 12.5mg . BP 129/62. No chest pain.

## 2015-02-13 NOTE — Progress Notes (Signed)
At 0101, pt converted to afib 110-140. Pt became very anxious and I spent at least 10 mins talking with her and then ekg was performed at 0115 confirming afib. Dr. Colon Flattery notified at 0125 and updated with above info and history. Orders received to begin cardizem drip. Continuing to monitor closely.

## 2015-02-14 ENCOUNTER — Telehealth: Payer: Self-pay | Admitting: Internal Medicine

## 2015-02-14 NOTE — Telephone Encounter (Signed)
Talked with pt - more afib since procedure. Hr staying around 100-105. bp 150sbp. Advise is to be expected after procedure. Not having dizziness or lightheadedness.  Told to increase zebeta to 10mg  tonight if sbp >110 and hr still over 100.  Can call back if any further issues if not will see her on wed next week as scheduled.

## 2015-02-14 NOTE — Telephone Encounter (Signed)
New message      Pt had an ablation tues-----came home wed afternoon.  AFIB started last night and has not stopped.  Please advise

## 2015-02-15 ENCOUNTER — Telehealth (HOSPITAL_COMMUNITY): Payer: Self-pay | Admitting: Nurse Practitioner

## 2015-02-15 NOTE — Telephone Encounter (Signed)
Pt. Called this am and reports that she is back in rhythm and so happy about that, but she feels like her BP is running too high with sys around 150. Instructed to increase losartan to 25 mg bid from qd and follow bp over weekend. Has f/u with me next Wednesday.

## 2015-02-19 ENCOUNTER — Encounter (HOSPITAL_COMMUNITY): Payer: Medicare Other | Admitting: Nurse Practitioner

## 2015-02-20 ENCOUNTER — Encounter (HOSPITAL_COMMUNITY): Payer: Medicare Other | Admitting: Nurse Practitioner

## 2015-02-21 ENCOUNTER — Ambulatory Visit (HOSPITAL_COMMUNITY)
Admission: RE | Admit: 2015-02-21 | Discharge: 2015-02-21 | Disposition: A | Discharge status: Home / Self Care | Payer: Medicare Other | Source: Ambulatory Visit | Attending: Nurse Practitioner | Admitting: Nurse Practitioner

## 2015-02-21 ENCOUNTER — Encounter (HOSPITAL_COMMUNITY): Payer: Self-pay | Admitting: Nurse Practitioner

## 2015-02-21 VITALS — BP 128/76 | HR 84 | Ht 62.0 in | Wt 153.4 lb

## 2015-02-21 DIAGNOSIS — I481 Persistent atrial fibrillation: Secondary | ICD-10-CM | POA: Diagnosis not present

## 2015-02-21 DIAGNOSIS — I4891 Unspecified atrial fibrillation: Secondary | ICD-10-CM | POA: Diagnosis present

## 2015-02-21 DIAGNOSIS — R0683 Snoring: Secondary | ICD-10-CM | POA: Diagnosis not present

## 2015-02-21 DIAGNOSIS — Z87891 Personal history of nicotine dependence: Secondary | ICD-10-CM | POA: Diagnosis not present

## 2015-02-21 DIAGNOSIS — I10 Essential (primary) hypertension: Secondary | ICD-10-CM | POA: Insufficient documentation

## 2015-02-21 DIAGNOSIS — I4819 Other persistent atrial fibrillation: Secondary | ICD-10-CM

## 2015-02-21 NOTE — Patient Instructions (Signed)
Your physician has recommended that you have a sleep study. This test records several body functions during sleep, including: brain activity, eye movement, oxygen and carbon dioxide blood levels, heart rate and rhythm, breathing rate and rhythm, the flow of air through your mouth and nose, snoring, body muscle movements, and chest and belly movement.  Call us to schedule 2 week follow up

## 2015-02-21 NOTE — Progress Notes (Addendum)
Patient ID: Maria Arnold, female   DOB: [birth year], 69 y.o.   MRN: [id number]  Primary Care Physician: Vickii Penna., MD Cardiologist: Dr. Aundra Dubin Electrophysiologist: Dr. Leanna Battles Maria Arnold is a 69 y.o. female with a h/o persistent afib s/p second ablation 02/12/15, here for one week check up. She felt like she was in afib and her BP was elevated for the first few days after the procedure and was instructed to increase losartan to 25 mg bid. The next day she felt a change in her heart rate and felt it was back in rhythm. Today, she was aware of irregular heart beat and feared she was back in afib, but ekg shows SR with PAC's. Bp well managed today and overall has no post procedure complaints.She is very relieved to hear she is in SR. Being complaint with NOAC. Continues on [place].  Today, she denies symptoms of chest pain, shortness of breath, orthopnea, PND, lower extremity edema, dizziness, presyncope, syncope, or neurologic sequela.  Aware of palpitations.The patient is tolerating medications without difficulties and is otherwise without complaint today.   Past Medical History  Diagnosis Date  . Cardiomyopathy     Nonischemic.  LHC (2/12): No coronary disease, EF 30-35% with periapical severe  hypokinesis.  Echo (2/12): EF 30-35%, periapical severe hypokinesis, normal RV, mild to moderate MR.  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   . Asthma   . HTN (hypertension)   . Foot pain     From bone spur  . PAF (paroxysmal atrial fibrillation)     with rapid ventricular response.  She is on coumadin and dofetilide.  She has had some breakthrough atrial fib on dofetilide.  Marland Kitchen Heart murmur 1972  . Chronic bronchitis     "I get it q yr"   Past Surgical History  Procedure Laterality Date  . Nasal sinus surgery  1980's  . Dilation and curettage of uterus    . Laparoscopic abdominal exploration   1980's  . Tee without cardioversion  09/21/2012    Procedure: TRANSESOPHAGEAL ECHOCARDIOGRAM (TEE);  Surgeon: Thayer Headings, MD;  Location: [facility] ENDOSCOPY;  Service: Cardiovascular;  Laterality: N/A;  . Atrial fibrillation ablation N/A 09/22/2012    Procedure: ATRIAL FIBRILLATION ABLATION;  Surgeon: [name], MD;  Location: [facility] CATH LAB;  Service: Cardiovascular;  Laterality: N/A;  . Atrial fibrillation ablation  02/12/2015  . Tonsillectomy and adenoidectomy  1953  . Cardiac catheterization  12/2010  . Atrial fibrillation ablation N/A 02/12/2015    Procedure: ATRIAL FIBRILLATION ABLATION;  Surgeon: [name], MD;  Location: [facility] CATH LAB;  Service: Cardiovascular;  Laterality: N/A;  . Tee without cardioversion N/A 02/12/2015    Procedure: TRANSESOPHAGEAL ECHOCARDIOGRAM (TEE);  Surgeon: Josue Hector, MD;  Location: [facility] ENDOSCOPY;  Service: Cardiovascular;  Laterality: N/A;    Current Outpatient Prescriptions  Medication Sig Dispense Refill  . apixaban (ELIQUIS) 5 MG TABS tablet Take 1 tablet (5 mg total) by mouth 2 (two) times daily. 10 tablet 0  . Ascorbic Acid (VITAMIN C) 500 MG tablet Take 500 mg by mouth daily.      . bisoprolol (ZEBETA) 5 MG tablet Take 1 tablet (5 mg total) by mouth 2 (two) times daily. 60 tablet 0  . Calcium-Magnesium-Zinc (CAL-MAG-ZINC PO) Take 1 tablet by mouth daily.     . Cyanocobalamin (VITAMIN B-12 CR PO) Take 3 tablets by mouth daily.     Marland Kitchen  diazepam (VALIUM) 2 MG tablet Take 2 mg by mouth at bedtime as needed (sleep).    . dofetilide (TIKOSYN) 250 MCG capsule TAKE ONE CAPSULE BY MOUTH EVERY 12 HOURS 10 capsule 0  . losartan (COZAAR) 25 MG tablet Take 1 tablet (25 mg total) by mouth daily. (Patient taking differently: Take 25 mg by mouth 2 (two) times daily. ) 10 tablet 0  . Multiple Vitamin (MULTIVITAMIN WITH MINERALS) TABS Take 1 tablet by mouth daily.    . pantoprazole (PROTONIX) 40 MG tablet Take 1 tablet (40 mg total) by mouth daily. 45 tablet 0  . TOBRADEX  ophthalmic ointment Place 1 drop into both eyes at bedtime.  1   No current facility-administered medications for this encounter.    Allergies  Allergen Reactions  . Lisinopril Cough    History   Social History  . Marital Status: Married    Spouse Name: N/A  . Number of Children: 2  . Years of Education: N/A   Occupational History  .  Other   Social History Main Topics  . Smoking status: Former Smoker -- 11 years    Types: Cigarettes  . Smokeless tobacco: Never Used     Comment: "quit smoking in 1976"  . Alcohol Use: 4.2 oz/week    7 Glasses of wine per week  . Drug Use: No  . Sexual Activity: Yes   Other Topics Concern  . Not on file   Social History Narrative   Lives in [place] with husband.  2 children, 3 grandchildren.  Nonsmoker, occasional ETOH.     Family History  Problem Relation Age of Onset  . Coronary artery disease Neg Hx     Premature    ROS- All systems are reviewed and negative except as per the HPI above  Physical Exam: Filed Vitals:   02/21/15 1413  BP: 128/76  Pulse: 84  Height: 5\' 2"  (1.575 m)  Weight: 153 lb 6.4 oz (69.582 kg)    GEN- The patient is well appearing, alert and oriented x 3 today.   Head- normocephalic, atraumatic Eyes-  Sclera clear, conjunctiva pink Ears- hearing intact Oropharynx- clear Neck- supple, no JVP Lymph- no cervical lymphadenopathy Lungs- Clear to ausculation bilaterally, normal work of breathing Heart- Slightly irregular rate and rhythm, consistent with PC's, no murmurs, rubs or gallops, PMI not laterally displaced GI- soft, NT, ND, + BS Extremities- no clubbing, cyanosis, or edema MS- no significant deformity or atrophy Skin- no rash or lesion Psych- euthymic mood, full affect Neuro- strength and sensation are intact  EKG- SR with premature supraventricular complexes, NSSTW changes, QTc 482 ms. Epic records reviewed.   Assessment and Plan:  1. Persistent AF s/p PVI 02/12/15 In SR, feeling  well, no post procedure complications. Continue tikosyn,bb, Noac for now. Slowly resume normal activities  2. HTN Well controlled on current meds. Continue losartan 25 mg bid for now  3. Snoring  Pt states that husband has been noticing that she snores and she does not sleep well  Will order a sleep study   F/u in two weeks at patient's request. Check bmet/mag on return. F/u Dr. Rayann Heman 3 months post procedure Assessment and Plan:

## 2015-02-21 NOTE — Addendum Note (Signed)
Encounter addended by: Sherran Needs, NP on: 02/21/2015  5:00 PM<BR>     Documentation filed: Notes Section

## 2015-02-22 NOTE — Addendum Note (Signed)
Encounter addended by: Martie Lee on: 02/22/2015  9:02 AM<BR>     Documentation filed: Charges VN

## 2015-03-18 ENCOUNTER — Ambulatory Visit: Payer: Medicare Other | Admitting: Cardiology

## 2015-03-21 ENCOUNTER — Inpatient Hospital Stay (HOSPITAL_COMMUNITY): Admission: RE | Admit: 2015-03-21 | Payer: Medicare Other | Source: Ambulatory Visit | Admitting: Nurse Practitioner

## 2015-03-26 ENCOUNTER — Other Ambulatory Visit: Payer: Self-pay | Admitting: *Deleted

## 2015-03-26 ENCOUNTER — Ambulatory Visit (HOSPITAL_COMMUNITY): Payer: Medicare Other | Admitting: Nurse Practitioner

## 2015-03-26 DIAGNOSIS — I10 Essential (primary) hypertension: Secondary | ICD-10-CM

## 2015-03-26 DIAGNOSIS — I480 Paroxysmal atrial fibrillation: Secondary | ICD-10-CM

## 2015-03-26 MED ORDER — LOSARTAN POTASSIUM 25 MG PO TABS: 25.0000 mg | ORAL_TABLET | Freq: Two times a day (BID) | ORAL | Status: DC

## 2015-03-26 MED ORDER — BISOPROLOL FUMARATE 5 MG PO TABS: 5.0000 mg | ORAL_TABLET | Freq: Two times a day (BID) | ORAL | Status: DC

## 2015-04-01 ENCOUNTER — Other Ambulatory Visit: Payer: Self-pay | Admitting: Internal Medicine

## 2015-04-12 ENCOUNTER — Encounter: Payer: Self-pay | Admitting: *Deleted

## 2015-04-15 ENCOUNTER — Ambulatory Visit: Payer: Medicare Other | Admitting: Physician Assistant

## 2015-04-30 ENCOUNTER — Other Ambulatory Visit: Payer: Self-pay | Admitting: *Deleted

## 2015-04-30 MED ORDER — AMOXICILLIN-POT CLAVULANATE 875-125 MG PO TABS: 1.0000 | ORAL_TABLET | Freq: Two times a day (BID) | ORAL | Status: DC

## 2015-05-02 ENCOUNTER — Encounter (HOSPITAL_BASED_OUTPATIENT_CLINIC_OR_DEPARTMENT_OTHER): Payer: Medicare Other

## 2015-05-05 ENCOUNTER — Other Ambulatory Visit: Payer: Self-pay | Admitting: Internal Medicine

## 2015-05-06 ENCOUNTER — Other Ambulatory Visit: Payer: Self-pay

## 2015-05-06 MED ORDER — PANTOPRAZOLE SODIUM 40 MG PO TBEC: 40.0000 mg | DELAYED_RELEASE_TABLET | Freq: Every day | ORAL | Status: DC

## 2015-06-17 ENCOUNTER — Other Ambulatory Visit: Payer: Self-pay

## 2015-06-17 DIAGNOSIS — I480 Paroxysmal atrial fibrillation: Secondary | ICD-10-CM

## 2015-06-17 DIAGNOSIS — I10 Essential (primary) hypertension: Secondary | ICD-10-CM

## 2015-06-17 MED ORDER — BISOPROLOL FUMARATE 5 MG PO TABS: 5.0000 mg | ORAL_TABLET | Freq: Two times a day (BID) | ORAL | Status: DC

## 2015-06-17 MED ORDER — LOSARTAN POTASSIUM 25 MG PO TABS: 25.0000 mg | ORAL_TABLET | Freq: Two times a day (BID) | ORAL | Status: DC

## 2015-06-28 ENCOUNTER — Ambulatory Visit: Payer: Medicare Other | Admitting: Cardiology

## 2015-07-08 ENCOUNTER — Ambulatory Visit: Payer: Medicare Other | Admitting: Internal Medicine

## 2015-07-18 ENCOUNTER — Encounter (HOSPITAL_BASED_OUTPATIENT_CLINIC_OR_DEPARTMENT_OTHER): Payer: Medicare Other

## 2015-07-22 ENCOUNTER — Ambulatory Visit: Payer: Medicare Other | Admitting: Physician Assistant

## 2015-08-12 ENCOUNTER — Ambulatory Visit: Payer: Medicare Other | Admitting: Internal Medicine

## 2015-08-12 ENCOUNTER — Telehealth: Payer: Self-pay | Admitting: Cardiology

## 2015-08-12 ENCOUNTER — Other Ambulatory Visit: Payer: Self-pay

## 2015-08-12 NOTE — Telephone Encounter (Signed)
New message      Pt forgot to take her morning medications.  Please call her and advise her on what to do

## 2015-08-12 NOTE — Telephone Encounter (Signed)
Pt states she usually takes doses of Tikosyn/Eliquis/bisoprolol/losartan at 9AM and 9PM.  Pt states she missed AM medications this morning because of an early morning eye doctor appt.  Per pharmacist--pt advised to take AM doses of medication now and take PM doses of medication at 11PM tonight, then usual AM/PM schedule tomorrow.  Pt advised, verbalized understanding.

## 2015-09-04 ENCOUNTER — Ambulatory Visit: Payer: Medicare Other | Admitting: Internal Medicine

## 2015-09-30 ENCOUNTER — Ambulatory Visit: Payer: Medicare Other | Admitting: Internal Medicine

## 2015-10-07 ENCOUNTER — Telehealth (HOSPITAL_COMMUNITY): Payer: Self-pay | Admitting: Cardiology

## 2015-10-07 MED ORDER — AMOXICILLIN-POT CLAVULANATE 875-125 MG PO TABS: 1.0000 | ORAL_TABLET | Freq: Two times a day (BID) | ORAL | Status: DC

## 2015-10-07 NOTE — Telephone Encounter (Signed)
Per VO Dr. Aundra Dubin refilled Augmentin x 10 days 0 refills

## 2015-10-23 ENCOUNTER — Telehealth: Payer: Self-pay | Admitting: Internal Medicine

## 2015-10-23 ENCOUNTER — Telehealth (HOSPITAL_COMMUNITY): Payer: Self-pay | Admitting: *Deleted

## 2015-10-23 MED ORDER — AMOXICILLIN-POT CLAVULANATE 875-125 MG PO TABS: 1.0000 | ORAL_TABLET | Freq: Two times a day (BID) | ORAL | Status: DC

## 2015-10-23 NOTE — Telephone Encounter (Signed)
New message     *STAT* If patient is at the pharmacy, call can be transferred to refill team.   1. Which medications need to be refilled? (please list name of each medication and dose if known) augmentin 875.125 mg   2. Which pharmacy/location (including street and city if local pharmacy) is medication to be sent to? [phone] [address]   3. Do they need a 30 day or 90 day supply? A week supply

## 2015-10-23 NOTE — Telephone Encounter (Signed)
Per Dr Aundra Dubin pt needs refill of Augmentin bid for 7 days only, rx sent in

## 2015-11-13 ENCOUNTER — Telehealth: Payer: Self-pay | Admitting: Internal Medicine

## 2015-11-13 ENCOUNTER — Telehealth: Payer: Self-pay | Admitting: Cardiology

## 2015-11-13 NOTE — Telephone Encounter (Signed)
error 

## 2015-11-13 NOTE — Telephone Encounter (Signed)
Pt states she is scheduled to have a minor procedure by dermatologist at [facility] tomorrow.  Pt states she was told to call our office and ask if she should hold her blood thinner for procedure tomorrow. Pt advised we request MD office doing procedure call our office with any requests/recommendations prior to procedure.

## 2015-11-13 NOTE — Telephone Encounter (Signed)
Pt having a cosmetic procedure tomorrow pm and was just called by the nurse and was told to call and ask if she needs to be off blood thinner-also wants an appt pls advise

## 2015-11-20 ENCOUNTER — Ambulatory Visit: Payer: Medicare Other | Admitting: Internal Medicine

## 2015-11-29 ENCOUNTER — Other Ambulatory Visit: Payer: Self-pay | Admitting: *Deleted

## 2015-11-29 MED ORDER — AMOXICILLIN-POT CLAVULANATE 875-125 MG PO TABS: ORAL_TABLET | ORAL | Status: DC

## 2015-12-18 ENCOUNTER — Encounter: Payer: Self-pay | Admitting: *Deleted

## 2015-12-19 ENCOUNTER — Institutional Professional Consult (permissible substitution): Payer: Medicare Other | Admitting: Internal Medicine

## 2015-12-26 ENCOUNTER — Institutional Professional Consult (permissible substitution): Payer: Medicare Other | Admitting: Pulmonary Disease

## 2015-12-27 ENCOUNTER — Other Ambulatory Visit: Payer: Self-pay | Admitting: *Deleted

## 2015-12-27 ENCOUNTER — Ambulatory Visit: Payer: Medicare Other | Admitting: Internal Medicine

## 2015-12-27 MED ORDER — DOFETILIDE 250 MCG PO CAPS: ORAL_CAPSULE | ORAL | Status: DC

## 2015-12-27 MED ORDER — APIXABAN 5 MG PO TABS: 5.0000 mg | ORAL_TABLET | Freq: Two times a day (BID) | ORAL | Status: DC

## 2016-01-17 ENCOUNTER — Institutional Professional Consult (permissible substitution): Payer: Medicare Other | Admitting: Internal Medicine

## 2016-01-23 ENCOUNTER — Encounter: Payer: Self-pay | Admitting: Internal Medicine

## 2016-02-05 ENCOUNTER — Ambulatory Visit (INDEPENDENT_AMBULATORY_CARE_PROVIDER_SITE_OTHER): Payer: Medicare Other | Admitting: Internal Medicine

## 2016-02-05 ENCOUNTER — Encounter: Payer: Self-pay | Admitting: Internal Medicine

## 2016-02-05 VITALS — BP 171/72 | HR 58 | Ht 62.0 in | Wt 161.2 lb

## 2016-02-05 DIAGNOSIS — I5022 Chronic systolic (congestive) heart failure: Secondary | ICD-10-CM | POA: Diagnosis not present

## 2016-02-05 DIAGNOSIS — I481 Persistent atrial fibrillation: Secondary | ICD-10-CM | POA: Diagnosis not present

## 2016-02-05 DIAGNOSIS — I4819 Other persistent atrial fibrillation: Secondary | ICD-10-CM

## 2016-02-05 DIAGNOSIS — I480 Paroxysmal atrial fibrillation: Secondary | ICD-10-CM

## 2016-02-05 DIAGNOSIS — I10 Essential (primary) hypertension: Secondary | ICD-10-CM

## 2016-02-05 MED ORDER — LOSARTAN POTASSIUM 50 MG PO TABS: 50.0000 mg | ORAL_TABLET | Freq: Two times a day (BID) | ORAL | Status: DC

## 2016-02-05 MED ORDER — LOSARTAN POTASSIUM 50 MG PO TABS: 50.0000 mg | ORAL_TABLET | Freq: Every day | ORAL | Status: DC

## 2016-02-05 NOTE — Patient Instructions (Addendum)
Medication Instructions:  Your physician has recommended you make the following change in your medication:  1) Stop Bisoprolol 2) Stop Tikosyn 3) Increase Losartan to 50 mg twice daily   Labwork: None ordered   Testing/Procedures: None ordered   Follow-Up:   Your physician recommends that you schedule a follow-up appointment in: 6 weeks with PCP   Your physician wants you to follow-up in: 6 months with Dr Rayann Heman Dennis Bast will receive a reminder letter in the mail two months in advance. If you don't receive a letter, please call our office to schedule the follow-up appointment.   Any Other Special Instructions Will Be Listed Below (If Applicable).     If you need a refill on your cardiac medications before your next appointment, please call your pharmacy.

## 2016-02-05 NOTE — Progress Notes (Signed)
Electrophysiology Office Note   Date:  02/06/2016   ID:  Maria, Arnold [birth year], MRN [id number]  PCP:  Vickii Penna., MD  Cardiologist:  Dr Aundra Dubin Primary Electrophysiologist: [name], MD    Chief Complaint  Patient presents with  . Follow-up    pt states no chest pain  . Atrial Fibrillation     History of Present Illness: Maria Arnold is a 70 y.o. female who presents today for electrophysiology evaluation.  She underwent afib ablation be me a year ago.  Unfortunately, she has been noncompliant with office visits and has not seen me since.  She seems to be doing reasonably well.  She has had rare palpitations but no clear episodes of afib.  She has had several episodes of bronchitis for which she has been managed by primary care.  She continues to drink ETOH despite my suggestion that she avoid this. Today, she denies symptoms of palpitations, chest pain, shortness of breath, orthopnea, PND, lower extremity edema, claudication, dizziness, presyncope, syncope, bleeding, or neurologic sequela. The patient is tolerating medications without difficulties and is otherwise without complaint today.    Past Medical History  Diagnosis Date  . Cardiomyopathy     Nonischemic.  LHC (2/12): No coronary disease, EF 30-35% with periapical severe  hypokinesis.  Echo (2/12): EF 30-35%, periapical severe hypokinesis, normal RV, mild to moderate MR.  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   . Asthma   . HTN (hypertension)   . Foot pain     From bone spur  . Persistent atrial fibrillation (HCC)     s/p AF ablation x 2  . Chronic bronchitis ([place])     "I get it q yr"   Past Surgical History  Procedure Laterality Date  . Nasal sinus surgery  1980's  . Dilation and curettage of uterus    . Laparoscopic abdominal exploration  1980's  . Tee without cardioversion  09/21/2012    Procedure:  TRANSESOPHAGEAL ECHOCARDIOGRAM (TEE);  Surgeon: Thayer Headings, MD;  Location: [facility] ENDOSCOPY;  Service: Cardiovascular;  Laterality: N/A;  . Atrial fibrillation ablation N/A 09/22/2012    Procedure: ATRIAL FIBRILLATION ABLATION;  Surgeon: [name], MD;  Location: [facility] CATH LAB;  Service: Cardiovascular;  Laterality: N/A;  . Atrial fibrillation ablation  02/12/2015  . Tonsillectomy and adenoidectomy  1953  . Cardiac catheterization  12/2010  . Atrial fibrillation ablation N/A 02/12/2015    Procedure: ATRIAL FIBRILLATION ABLATION;  Surgeon: [name], MD;  Location: [facility] CATH LAB;  Service: Cardiovascular;  Laterality: N/A;  . Tee without cardioversion N/A 02/12/2015    Procedure: TRANSESOPHAGEAL ECHOCARDIOGRAM (TEE);  Surgeon: Josue Hector, MD;  Location: [facility] ENDOSCOPY;  Service: Cardiovascular;  Laterality: N/A;     Current Outpatient Prescriptions  Medication Sig Dispense Refill  . apixaban (ELIQUIS) 5 MG TABS tablet Take 1 tablet (5 mg total) by mouth 2 (two) times daily. 90 tablet 0  . Ascorbic Acid (VITAMIN C) 500 MG tablet Take 500 mg by mouth daily.      . Calcium-Magnesium-Zinc (CAL-MAG-ZINC PO) Take 1 tablet by mouth daily.     . Cyanocobalamin (VITAMIN B-12 CR PO) Take 3 tablets by mouth daily.     Marland Kitchen losartan (COZAAR) 50 MG tablet Take 1 tablet (50 mg total) by mouth 2 (two) times daily. 180 tablet 3  . Multiple Vitamin (MULTIVITAMIN WITH MINERALS) TABS Take 1 tablet by mouth daily.  No current facility-administered medications for this visit.    Allergies:   Lisinopril   Social History:  The patient  reports that she has quit smoking. Her smoking use included Cigarettes. She quit after 11 years of use. She has never used smokeless tobacco. She reports that she drinks about 4.2 oz of alcohol per week. She reports that she does not use illicit drugs.   Family History:  The patient's family history includes Emphysema in her mother; Prostate cancer in her father; Skin cancer in her  father. There is no history of Coronary artery disease.    ROS:  Please see the history of present illness.   All other systems are reviewed and negative.    PHYSICAL EXAM: VS:  BP 171/72 mmHg  Pulse 58  Ht '5\' 2"'$  (1.575 m)  Wt 161 lb 3.2 oz (73.12 kg)  BMI 29.48 kg/m2 , BMI Body mass index is 29.48 kg/(m^2). GEN: Well nourished, well developed, in no acute distress HEENT: normal Neck: no JVD, carotid bruits, or masses Cardiac: RRR; no murmurs, rubs, or gallops,no edema  Respiratory:  clear to auscultation bilaterally, normal work of breathing GI: soft, nontender, nondistended, + BS MS: no deformity or atrophy Skin: warm and dry  Neuro:  Strength and sensation are intact Psych: euthymic mood, full affect  EKG:  EKG is ordered today. The ekg ordered today shows sinus rhythm 58 bpm, PR 172 msec, QRS 96 msec, Qtc 433 msec, otherwise normal ekg   Recent Labs: 02/11/2015: Hemoglobin 15.4*; Platelets 304.0 02/13/2015: BUN 9; Creatinine, Ser 0.46*; Magnesium 1.8; Potassium 3.8; Sodium 139    Lipid Panel     Component Value Date/Time   CHOL 171 12/13/2014 1553   TRIG 129.0 12/13/2014 1553   HDL 48.10 12/13/2014 1553   CHOLHDL 4 12/13/2014 1553   VLDL 25.8 12/13/2014 1553   LDLCALC 97 12/13/2014 1553     Wt Readings from Last 3 Encounters:  02/05/16 161 lb 3.2 oz (73.12 kg)  02/21/15 153 lb 6.4 oz (69.582 kg)  02/13/15 145 lb 8.1 oz (66 kg)     ASSESSMENT AND PLAN:  1.  Persistent atrial fibrillation Doing well s/p ablation Given noncompliance with office visits, I worry that she is a poor candidate for tikosyn going forward.  As she has had no clear episodes of AF for a year post ablation, I will stop tikosyn at this time. Continue long term anticoagulation Stop bisoprolol given difficulty with SOB and wheezing. Lifestyle modification including regular exercise, stress reduction, and ETOh avoidance discussed at length  2. HTN Very elevated today Increase losartan to  '50mg'$  BID Follow-up closely with primary care is encouraged  3. Nonischemic CM (tachycardia mediated) Resolved with sinus rhythm May want repeat echo when she sees Dr Aundra Dubin in June   Follow-up with PCP in 6 weeks for BP management Follow-up with Dr Aundra Dubin as scheduled I will see again in 6 months  Labs/ tests ordered today include:    Orders Placed This Encounter  Procedures  . EKG 12-Lead     Signed, [name], MD  02/06/2016 9:32 PM     [address] [place] [address] [phone] (office) [phone] (fax)

## 2016-02-06 ENCOUNTER — Encounter: Payer: Self-pay | Admitting: Internal Medicine

## 2016-02-07 ENCOUNTER — Telehealth: Payer: Self-pay | Admitting: Cardiology

## 2016-02-07 NOTE — Telephone Encounter (Signed)
Spoke with pt, appt scheduled with Dr Aundra Dubin.

## 2016-02-07 NOTE — Telephone Encounter (Signed)
New Message:  Pt says she is returning Anne's phone call to get scheduled for an appt with Dr. Aundra Dubin.

## 2016-02-10 ENCOUNTER — Ambulatory Visit: Payer: Medicare Other | Admitting: Internal Medicine

## 2016-02-11 ENCOUNTER — Institutional Professional Consult (permissible substitution): Payer: Medicare Other | Admitting: Internal Medicine

## 2016-02-18 ENCOUNTER — Other Ambulatory Visit: Payer: Self-pay | Admitting: *Deleted

## 2016-02-18 MED ORDER — LOSARTAN POTASSIUM 50 MG PO TABS: 50.0000 mg | ORAL_TABLET | Freq: Two times a day (BID) | ORAL | Status: DC

## 2016-02-19 ENCOUNTER — Telehealth: Payer: Self-pay | Admitting: Internal Medicine

## 2016-02-19 ENCOUNTER — Other Ambulatory Visit: Payer: Self-pay | Admitting: *Deleted

## 2016-02-19 MED ORDER — APIXABAN 5 MG PO TABS: 5.0000 mg | ORAL_TABLET | Freq: Two times a day (BID) | ORAL | Status: DC

## 2016-02-19 NOTE — Telephone Encounter (Signed)
New message       *STAT* If patient is at the pharmacy, call can be transferred to refill team.   1. Which medications need to be refilled? (please list name of each medication and dose if known) eliquis '5mg'$  2. Which pharmacy/location (including street and city if local pharmacy) is medication to be sent to?  [facility] [facility] [place], and 90day supply sent to [facility]-------pt only has 2 pills left 3. Do they need a 30 day or 90 day supply?

## 2016-02-19 NOTE — Telephone Encounter (Signed)
Refills already sent, see prior phones notes, can get refills at upcoming appt in may

## 2016-03-04 ENCOUNTER — Institutional Professional Consult (permissible substitution): Payer: Medicare Other | Admitting: Internal Medicine

## 2016-03-11 ENCOUNTER — Encounter: Payer: Self-pay | Admitting: Cardiology

## 2016-03-11 ENCOUNTER — Ambulatory Visit (INDEPENDENT_AMBULATORY_CARE_PROVIDER_SITE_OTHER): Payer: Medicare Other | Admitting: Cardiology

## 2016-03-11 VITALS — BP 134/78 | HR 100 | Ht 62.0 in | Wt 158.0 lb

## 2016-03-11 DIAGNOSIS — I10 Essential (primary) hypertension: Secondary | ICD-10-CM | POA: Diagnosis not present

## 2016-03-11 DIAGNOSIS — I480 Paroxysmal atrial fibrillation: Secondary | ICD-10-CM

## 2016-03-11 DIAGNOSIS — I5181 Takotsubo syndrome: Secondary | ICD-10-CM | POA: Diagnosis not present

## 2016-03-11 MED ORDER — DILTIAZEM HCL ER COATED BEADS 120 MG PO CP24: 120.0000 mg | ORAL_CAPSULE | Freq: Every day | ORAL | Status: DC

## 2016-03-11 NOTE — Patient Instructions (Signed)
Medication Instructions:  Start Diltiazem CD '120mg'$  daily  Labwork: Your physician recommends that you return for a FASTING lipid profile/CMET/CBCd/TSH/Free T4/Free T3. This is scheduled for Friday May 5,2017. The lab is open from 7:30 AM-5PM. DO not eat or drink after midnight the night before the lab.   Testing/Procedures: None   Follow-Up: Your physician recommends that you schedule a follow-up appointment with Dr Aundra Dubin May 23,2017   Any Other Special Instructions Will Be Listed Below (If Applicable).     If you need a refill on your cardiac medications before your next appointment, please call your pharmacy.

## 2016-03-12 NOTE — Progress Notes (Signed)
Patient ID: Maria Arnold, female   DOB: [birth year], 70 y.o.   MRN: [id number] PCP: Dr Tammi Klippel  70 yo with hospital admission in 2/12 for atrial fibrillation and CHF presents for followup.  For about a week prior to admission, she had had congestion and shortness of breath. She thought she had a bad cold. She was seen by her PCP and ECG showed atrial fibrillation with RVR. She was admitted to [facility] for evaluation. CXR did not show PNA. She had wheezing consistent with a prior asthma diagnosis. She was then noted by echo to have EF 30-35% with peri-apical severe hypokinesis. LHC was done, there was no angiographic coronary disease. 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 She had suffered an emotional shock about a week prior to admission when her daughter-in-law had a miscarriage.   She underwent TEE-guided cardioversion, but this failed to convert her to NSR. She was then started on Tikosyn, which did convert her to NSR but her QTc prolonged in the setting of using moxifloxacin for acute bronchitis treatment. Moxifloxacin was stopped and the dofetilide dose was cut back to 250 micrograms two times a day. At this dose, QTc was normal.  Repeat echo done in 7/12 showed EF improved to 55-60% with normal RV and valves.  She underwent atrial fibrillation ablation in 11/13.  In 4/14, we talked about stopping Tikosyn and she got very nervous.  She actually went into atrial fibrillation in the office so I did not stop Tikosyn.     She saw Dr Rayann Heman in 3/17 and stopped Tikosyn and bisoprolol.  Since stopping bisoprolol, she has felt much better, no further wheezing or dyspnea.  She had several bouts of respiratory decompensation that sounded like bronchitis prior to that this winter.  No chest pain, lightheadedness, PND, orthopnea.  She is in atrial fibrillation with RVR, HR 120s today.  She says that she has felt palpitations occasionally since stopping  Tikosyn.    ECG: Atrial fibrillation, 132.  Nonspecific T wave flattening.   Labs (2/12): K 4.8, creatinine 0.67  Labs (3/12): K 4.2, creatinine 0.5  Labs (11/13): K 3.8, creatinine 0.5 Labs (4/14): K 4.1, creatinine 0.7, HCT 44.7 Labs (12/14): K 4.1, creatinine 0.6, HCT 42, TSH normal, Mg 2.1 Labs (8/15): K 4.2, creatinine 0.5, HCT 41, thyroid indices normal Labs (4/16): K 3.8, creatinine 0.46, Mg 1.8  Allergies (verified):  No Known Drug Allergies   Past Medical History:  1. Paroxysmal atrial fibrillation with rapid ventricular response. She is on coumadin and dofetilide. She has had some breakthrough atrial fib on dofetilide. Atrial fibrillation ablation in 11/13 and redo in 4/16. Dofetilide stopped 3/17, had breakthrough atrial fibrillation after this.  2. Cardiomyopathy: Nonischemic. LHC (2/12): No coronary disease, EF 30-35% with periapical severe hypokinesis. Echo (2/12): EF 30-35%, periapical severe hypokinesis, normal RV, mild to moderate MR. 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  Repeat echo in 7/12 showed EF 55-60%, mild biatrial enlargement, normal RV, normal valves.  - TEE (4/16) with EF 55-60% 3. Asthma: Beta blocker intolerance.  4. HTN  5. Foot pain from bone spur  6. Low back pain  7. Narrow angle glaucoma 8. Nasal bone fracture s/p ENT surgery   Family History:  No premature CAD   Social History:  Lives in [place] with husband. 2 children, 3 grandchildren. Nonsmoker, occasional ETOH.   Review of Systems  All systems reviewed  and negative except as per HPI.   Current Outpatient Prescriptions  Medication Sig Dispense Refill  . apixaban (ELIQUIS) 5 MG TABS tablet Take 1 tablet (5 mg total) by mouth 2 (two) times daily. 180 tablet 1  . Ascorbic Acid (VITAMIN C) 500 MG tablet Take 500 mg by mouth daily.      . Cyanocobalamin (VITAMIN B-12 CR PO) Take 3 tablets by mouth daily.      Marland Kitchen losartan (COZAAR) 50 MG tablet Take 1 tablet (50 mg total) by mouth 2 (two) times daily. 180 tablet 3  . Multiple Vitamin (MULTIVITAMIN WITH MINERALS) TABS Take 1 tablet by mouth daily.    Marland Kitchen neomycin-polymyxin b-dexamethasone (MAXITROL) 3.5-10000-0.1 OINT PLACE 1 APPLICATION INTO ONE EYE NIGHTLY (LEFT EYE)  3  . diltiazem (CARDIZEM CD) 120 MG 24 hr capsule Take 1 capsule (120 mg total) by mouth daily. 30 capsule 11   No current facility-administered medications for this visit.    BP 134/78 mmHg  Pulse 100  Ht '5\' 2"'$  (1.575 m)  Wt 158 lb (71.668 kg)  BMI 28.89 kg/m2 General: NAD Neck: No JVD, no thyromegaly or thyroid nodule.  Lungs: Clear to auscultation bilaterally with normal respiratory effort. CV: Nondisplaced PMI.  Heart mildly tachy, irregular S1/S2, no S3/S4, no murmur.  No peripheral edema.  No carotid bruit.  Normal pedal pulses.  Abdomen: Soft, nontender, no hepatosplenomegaly, no distention.  Neurologic: Alert and oriented x 3.  Psych: Normal affect. Extremities: No clubbing or cyanosis.   Assessment/Plan: 1. Takotsubo cardiomyopathy: Resolved, EF normal on TEE in 4/16.   - She will remain on losartan 50 mg bid. BMET today.  - She has stopped bisoprolol.  I think that she may develop dyspnea/wheezing/respiratory decompensation on beta blockers and would avoid (had trouble with Coreg then bisoprolol).   2. Atrial fibrillation: Paroxysmal.  She has had afib ablation x 2. She stopped Tikosyn in 3/17.  Since then, has felt like she is in and out of atrial fibrillation.  She is in atrial fibrillation with RVR today.  - Start diltiazem CD 120 daily to help with rate control.   - Continue Eliquis. CBC today.  - We discussed restarting dofetilide. She would have to go back in the hospital for 3 days.  She wants to try the diltiazem CD to see how she feels.  That would be fine, I will see her back in 3 wks and we will reassess.  I think, however, that she will need dofetilide if we  are to keep her out of atrial fibrillation.  3. HTN: BP is under reasonable control on current regimen.  4. Patient is concerned for an endocrine problem given generalized fatigue and hair loss.  However, thyroid indices have been normal.  Recheck today.  5. Check lipids today.   Followup in 3 months.    Loralie Champagne 03/12/2016

## 2016-03-13 ENCOUNTER — Other Ambulatory Visit: Payer: Medicare Other

## 2016-03-18 ENCOUNTER — Other Ambulatory Visit: Payer: Medicare Other

## 2016-03-31 ENCOUNTER — Ambulatory Visit: Payer: Medicare Other | Admitting: Cardiology

## 2016-04-02 ENCOUNTER — Telehealth: Payer: Self-pay | Admitting: Cardiology

## 2016-04-02 MED ORDER — DILTIAZEM HCL ER COATED BEADS 240 MG PO CP24: 240.0000 mg | ORAL_CAPSULE | Freq: Every day | ORAL | Status: DC

## 2016-04-02 NOTE — Telephone Encounter (Signed)
Spoke with patient about diltiazem dose

## 2016-04-02 NOTE — Telephone Encounter (Signed)
Pt states since adding Diltiazem CD '120mg'$  daily she has not felt any better.  Pt states HR 88-100 and BP 140-150/ 80  at home in the last week or so.

## 2016-04-02 NOTE — Telephone Encounter (Signed)
Pt c/o medication issue:  1. Name of Medication: diltiazam '120mg'$  2. How are you currently taking this medication (dosage and times per day)? 1 day  3. Are you having a reaction (difficulty breathing--STAT)? yes  4. What is your medication issue? Its not working

## 2016-04-02 NOTE — Telephone Encounter (Signed)
New message      Pt c/o medication issue:  1. Name of Medication: Diltiazem  2. How are you currently taking this medication (dosage and times per day)? No  Per pr they just picked it up from the drug store-240 mg po  3. Are you having a reaction (difficulty breathing--STAT)? no  4. What is your medication issue? The pt has a question about the medication be fore she takes it

## 2016-04-02 NOTE — Telephone Encounter (Signed)
Pt states BP now is 155/90, heart rate is 104, pt states this is because she is "scared". Pt states she is not having any new symptoms since office visit with Dr Aundra Dubin 03/11/16, she is just not feeling any better.  Pt states she is concerned because she is going to the beach for 5 days starting tomorrow.   Pt advised I will forward to Dr Aundra Dubin for review.

## 2016-04-02 NOTE — Telephone Encounter (Signed)
HR and BP still high, increase diltizem CD to 240 mg daily.

## 2016-04-02 NOTE — Telephone Encounter (Signed)
Pt advised to increase Diltiazem CD 240 mg daily.

## 2016-04-03 ENCOUNTER — Telehealth: Payer: Self-pay | Admitting: Cardiology

## 2016-04-03 NOTE — Telephone Encounter (Signed)
New message     Pt c/o BP issue: STAT if pt c/o blurred vision, one-sided weakness or slurred speech  1. What are your last 5 BP readings? 5/26 115/65/ about hr ago b/p 143/80  2. Are you having any other symptoms (ex. Dizziness, headache, blurred vision, passed out)? no  3. What is your BP issue? The pt is concerned about the blood pressure medication making the blood pressure go up and down    Pt c/o medication issue:  1. Name of Medication: Diltiazem  2. How are you currently taking this medication (dosage and times per day)? 120 mg po daily  3. Are you having a reaction (difficulty breathing--STAT)? no  4. What is your medication issue? The nurse instructed the pt to take 120 mg po in and 120 mg at night, the pt was told to start taking the medication on Saturday 240 mg po in am but is afraid to so cause this am the bp was low, the pt wants to know can she just the medication in the evening like usual.

## 2016-04-03 NOTE — Telephone Encounter (Signed)
Returned call to patient.She stated diltiazem increased yesterday to 120 mg twice a day.Stated she is suppose to start taking diltiazem 240 mg daily tomorrow.Stated she is concerned B/P at 2:30 pm 115/65.Stated she was afraid to take 2nd dose of diltiazem 120 mg.Presently B/P 140/88 pulse 100.Spoke to [employer] PA she advised ok to take Diltiazem 120 mg now.Advised ok to start Diltiazem 240 mg daily in morning.Advised to continue to monitor B/P,bring diary to next appointment with Melina Copa PA 04/08/16 at 3:00 pm.

## 2016-04-08 ENCOUNTER — Other Ambulatory Visit: Payer: Medicare Other

## 2016-04-08 ENCOUNTER — Ambulatory Visit: Payer: Medicare Other | Admitting: Physician Assistant

## 2016-04-09 ENCOUNTER — Other Ambulatory Visit: Payer: Medicare Other

## 2016-04-10 ENCOUNTER — Other Ambulatory Visit: Payer: Medicare Other

## 2016-04-14 ENCOUNTER — Other Ambulatory Visit: Payer: Medicare Other

## 2016-04-15 ENCOUNTER — Other Ambulatory Visit (INDEPENDENT_AMBULATORY_CARE_PROVIDER_SITE_OTHER): Payer: Medicare Other | Admitting: *Deleted

## 2016-04-15 DIAGNOSIS — I480 Paroxysmal atrial fibrillation: Secondary | ICD-10-CM

## 2016-04-15 DIAGNOSIS — I10 Essential (primary) hypertension: Secondary | ICD-10-CM | POA: Diagnosis not present

## 2016-04-15 LAB — COMPREHENSIVE METABOLIC PANEL
ALT: 19 U/L (ref 6–29)
AST: 16 U/L (ref 10–35)
Albumin: 4.6 g/dL (ref 3.6–5.1)
Alkaline Phosphatase: 68 U/L (ref 33–130)
BUN: 14 mg/dL (ref 7–25)
CO2: 26 mmol/L (ref 20–31)
Calcium: 9 mg/dL (ref 8.6–10.4)
Chloride: 103 mmol/L (ref 98–110)
Creat: 0.55 mg/dL — ABNORMAL LOW (ref 0.60–0.93)
Glucose, Bld: 108 mg/dL — ABNORMAL HIGH (ref 65–99)
Potassium: 3.9 mmol/L (ref 3.5–5.3)
Sodium: 142 mmol/L (ref 135–146)
Total Bilirubin: 0.9 mg/dL (ref 0.2–1.2)
Total Protein: 6.4 g/dL (ref 6.1–8.1)

## 2016-04-15 LAB — CBC WITH DIFFERENTIAL/PLATELET
Basophils Absolute: 0 cells/uL (ref 0–200)
Basophils Relative: 0 %
Eosinophils Absolute: 0 cells/uL — ABNORMAL LOW (ref 15–500)
Eosinophils Relative: 0 %
HCT: 42.7 % (ref 35.0–45.0)
Hemoglobin: 14.7 g/dL (ref 11.7–15.5)
Lymphocytes Relative: 23 %
Lymphs Abs: 1702 cells/uL (ref 850–3900)
MCH: 34.8 pg — ABNORMAL HIGH (ref 27.0–33.0)
MCHC: 34.4 g/dL (ref 32.0–36.0)
MCV: 101.2 fL — ABNORMAL HIGH (ref 80.0–100.0)
MPV: 9.4 fL (ref 7.5–12.5)
Monocytes Absolute: 740 cells/uL (ref 200–950)
Monocytes Relative: 10 %
Neutro Abs: 4958 cells/uL (ref 1500–7800)
Neutrophils Relative %: 67 %
Platelets: 299 10*3/uL (ref 140–400)
RBC: 4.22 MIL/uL (ref 3.80–5.10)
RDW: 12.8 % (ref 11.0–15.0)
WBC: 7.4 10*3/uL (ref 3.8–10.8)

## 2016-04-15 LAB — LIPID PANEL
Cholesterol: 188 mg/dL (ref 125–200)
HDL: 63 mg/dL (ref >=46)
LDL Cholesterol: 109 mg/dL (ref <130)
Total CHOL/HDL Ratio: 3 Ratio (ref <=5.0)
Triglycerides: 81 mg/dL (ref <150)
VLDL: 16 mg/dL (ref <30)

## 2016-04-15 LAB — T3, FREE: T3, Free: 4 pg/mL (ref 2.3–4.2)

## 2016-04-15 LAB — T4, FREE: Free T4: 1.4 ng/dL (ref 0.8–1.8)

## 2016-04-15 LAB — TSH: TSH: 1.47 mIU/L

## 2016-04-15 NOTE — Addendum Note (Signed)
Addended by: Eulis Foster on: 04/15/2016 09:37 AM   Modules accepted: Orders

## 2016-04-17 ENCOUNTER — Ambulatory Visit: Payer: Medicare Other | Admitting: Cardiology

## 2016-04-17 ENCOUNTER — Telehealth: Payer: Self-pay | Admitting: Cardiology

## 2016-04-17 MED ORDER — LOSARTAN POTASSIUM 50 MG PO TABS: 50.0000 mg | ORAL_TABLET | Freq: Two times a day (BID) | ORAL | Status: DC

## 2016-04-17 NOTE — Telephone Encounter (Signed)
Mrs. Horacek is calling because she belives she skipped a dose of her Diltiazem and is wanting to know what shall she do . Please call   Thanks

## 2016-04-17 NOTE — Telephone Encounter (Signed)
Per Dr Orpah Greek advised to go ahead and take Diltiazem now.

## 2016-04-23 ENCOUNTER — Ambulatory Visit: Payer: Medicare Other | Admitting: Physician Assistant

## 2016-04-30 ENCOUNTER — Encounter: Payer: Self-pay | Admitting: Physician Assistant

## 2016-04-30 NOTE — Progress Notes (Signed)
Cardiology Office Note    Date:  05/01/2016  ID:  Maria Arnold, Maria Arnold February 19, 1946, MRN [id number] PCP:  Vickii Penna., MD  Cardiologist: Dr. Aundra Dubin   Chief Complaint: f/u afib  History of Present Illness:  Maria Arnold is a 70 y.o. female with history of paroxysmal atrial fibrillation (s/p prior ablation x2), NICM, HTN, glaucoma who presents for f/u of BP and HR. Per review of chart, she sustained hospital admission in 2/12 for atrial fibrillation and CHF.For about a week prior to admission, she had had congestion and shortness of breath. She thought she had a bad cold. She was seen by her PCP and ECG showed atrial fibrillation with RVR. She was admitted to [facility] for evaluation. CXR did not show PNA. She had wheezing consistent with a prior asthma diagnosis. She was then noted by echo to have EF 30-35% with peri-apical severe hypokinesis. LHC was done, there was no angiographic coronary disease. 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 She had suffered an emotional shock about a week prior to admission when her daughter-in-law had a miscarriage. She underwent TEE-guided cardioversion, but this failed to convert her to NSR. She was then started on Tikosyn, which did convert her to NSR but her QTc prolonged in the setting of using moxifloxacin for acute bronchitis treatment. Moxifloxacin was stopped and the dofetilide dose was cut back to 250 micrograms BID. At this dose, QTc was normal. Repeat echo done in 7/12 showed EF improved to 55-60% with normal RV and valves.She underwent atrial fibrillation ablation in 11/13.In 4/14, Dr. Aundra Dubin discussed stopping Tikosyn and she got very nervous. She actually went into atrial fibrillation in the office so it was not stopped at that time.She saw Dr Rayann Heman in 3/17 and stopped Tikosyn and bisoprolol.When seen in follow-up 03/11/16 by Dr. Aundra Dubin, she was back in atrial fib with HR 120s. He started  diltiazem. There was discussion about restarting Tikosyn but the patient wished to try diltiazem first. Labs 04/15/16: glu 108, otherwise unremarkable, TSH wnl, Hgb nl, macrocytic. Last echo was TEE 02/2015: EF 55-60%, no RWMA, mild TR.  She returns for follow-up. She is still in afib with HR in the 120s. BP remains elevated - initial check 180/90 with recheck 140/90 after several minutes of sitting quietly. She does not feel like the diltiazem has done much for her afib. She does not feel overt discomfort from her afib, but it is bothersome to her. No chest pain or SOB. She reports BP of 130s/70s at home during the day, and higher PMs in the evenings. She feels very strongly that her thyroid must be abnormal although she had recent normal TSH, free T4 and free T4. She says she was told at a health food store that she had iodine deficiency. She brings a large bag full of vitamins and supplements with her today.    Past Medical History  Diagnosis Date  . NICM (nonischemic cardiomyopathy) ([place])     a. EF 30-35% in 2012 -> LHC (2/12): No coronary disease, Cardiomyopathy felt either tachycardia-mediated or Takotsubo (emotional shock the week before hospitalization). b. f/u echo 05/2011 with EF 55-60%, normal RV, normal valves.   . Asthma   . HTN (hypertension)   . Foot pain     From bone spur  . Paroxysmal atrial fibrillation (HCC)     a. AF ablation x 2. b. Previously on Tikosyn.  . Beta-blocker intolerance   . History of  narrow angle glaucoma     Past Surgical History  Procedure Laterality Date  . Nasal sinus surgery  1980's  . Dilation and curettage of uterus    . Laparoscopic abdominal exploration  1980's  . Tee without cardioversion  09/21/2012    Procedure: TRANSESOPHAGEAL ECHOCARDIOGRAM (TEE);  Surgeon: Thayer Headings, MD;  Location: [facility] ENDOSCOPY;  Service: Cardiovascular;  Laterality: N/A;  . Atrial fibrillation ablation N/A 09/22/2012    Procedure: ATRIAL FIBRILLATION ABLATION;   Surgeon: [name], MD;  Location: [facility] CATH LAB;  Service: Cardiovascular;  Laterality: N/A;  . Atrial fibrillation ablation  02/12/2015  . Tonsillectomy and adenoidectomy  1953  . Cardiac catheterization  12/2010  . Atrial fibrillation ablation N/A 02/12/2015    Procedure: ATRIAL FIBRILLATION ABLATION;  Surgeon: [name], MD;  Location: [facility] CATH LAB;  Service: Cardiovascular;  Laterality: N/A;  . Tee without cardioversion N/A 02/12/2015    Procedure: TRANSESOPHAGEAL ECHOCARDIOGRAM (TEE);  Surgeon: Josue Hector, MD;  Location: [facility] ENDOSCOPY;  Service: Cardiovascular;  Laterality: N/A;    Current Medications: Current Outpatient Prescriptions  Medication Sig Dispense Refill  . apixaban (ELIQUIS) 5 MG TABS tablet Take 1 tablet (5 mg total) by mouth 2 (two) times daily. 180 tablet 1  . Ascorbic Acid (VITAMIN C) 500 MG tablet Take 500 mg by mouth daily.      . Cyanocobalamin (VITAMIN B-12 CR PO) Take 3 tablets by mouth daily.     Marland Kitchen diltiazem (CARDIZEM CD) 240 MG 24 hr capsule Take 1 capsule (240 mg total) by mouth daily. 90 capsule 3  . losartan (COZAAR) 50 MG tablet Take 1 tablet (50 mg total) by mouth 2 (two) times daily. 180 tablet 3  . Multiple Vitamin (MULTIVITAMIN WITH MINERALS) TABS Take 1 tablet by mouth daily.    Marland Kitchen neomycin-polymyxin b-dexamethasone (MAXITROL) 3.5-10000-0.1 OINT PLACE 1 APPLICATION INTO ONE EYE NIGHTLY (LEFT EYE)  3   No current facility-administered medications for this visit.     Allergies:   Lisinopril   Social History   Social History  . Marital Status: Married    Spouse Name: N/A  . Number of Children: 2  . Years of Education: N/A   Occupational History  .  Other   Social History Main Topics  . Smoking status: Former Smoker -- 11 years    Types: Cigarettes  . Smokeless tobacco: Never Used     Comment: "quit smoking in 1976"  . Alcohol Use: 4.2 oz/week    7 Glasses of wine per week  . Drug Use: No  . Sexual Activity: Yes   Other Topics Concern    . None   Social History Narrative   Lives in [place] with husband.  2 children, 5 grandchildren.  Nonsmoker, occasional ETOH.      Family History:  The patient's family history includes Emphysema in her mother; Prostate cancer in her father; Skin cancer in her father. There is no history of Coronary artery disease.   ROS:   Please see the history of present illness. + hair loss. All other systems are reviewed and otherwise negative.    PHYSICAL EXAM:   VS:  BP 180/90 mmHg  Pulse 120  Ht '5\' 2"'$  (1.575 m)  Wt 157 lb 6.4 oz (71.396 kg)  BMI 28.78 kg/m2  BMI: Body mass index is 28.78 kg/(m^2). GEN: Well nourished, well developed WF in no acute distress HEENT: normocephalic, atraumatic Neck: no JVD, carotid bruits, or masses Cardiac: irregularly irregular, tachycardic; no murmurs,  rubs, or gallops, no edema  Respiratory:  clear to auscultation bilaterally, normal work of breathing GI: soft, nontender, nondistended, + BS MS: no deformity or atrophy Skin: warm and dry, no rash Neuro:  Alert and Oriented x 3, Strength and sensation are intact, follows commands Psych: euthymic mood, full affect  Wt Readings from Last 3 Encounters:  05/01/16 157 lb 6.4 oz (71.396 kg)  03/11/16 158 lb (71.668 kg)  02/05/16 161 lb 3.2 oz (73.12 kg)      Studies/Labs Reviewed:   EKG:  EKG was ordered today and personally reviewed by me and demonstrates atrial fib 120bpm, nonspecific ST-T changes, handcalculated average QTc 434.8  Recent Labs: 04/15/2016: ALT 19; BUN 14; Creat 0.55*; Hemoglobin 14.7; Platelets 299; Potassium 3.9; Sodium 142; TSH 1.47   Lipid Panel    Component Value Date/Time   CHOL 188 04/15/2016 0937   TRIG 81 04/15/2016 0937   HDL 63 04/15/2016 0937   CHOLHDL 3.0 04/15/2016 0937   VLDL 16 04/15/2016 0937   LDLCALC 109 04/15/2016 0937    Additional studies/ records that were reviewed today include: Summarized above.    ASSESSMENT & PLAN:   1. Paroxysmal atrial  fibrillation - she is concerned that her thyroid function is abnormal despite normal thyroid labwork. I tried my best to reassure her of this. She inquired about the bag of vitamins she brought in today. I told her I do not feel comfortable recommending that she take these supplements - there are ingredients in these that have not been tested for clinical safety or efficacy (including high dose iodine or silicon dioxide). We discussed several options for her afib including trying verapamil versus restarting Tikosyn. Dr. Rayann Heman stopped Tikosyn in 01/2016 as she had had no clear episodes of AF for a year post-ablation at that time. He also raised concern for her compliance with office visits while on Tikosyn. This was stressed to the patient. At this time she prefers to try verapamil in place of diltiazem. She will see how things go over the weekend and call us if she decides to proceed with Tikosyn. Dr. Aundra Dubin felt in his previous note that she would require this if she wanted to stay out of afib. 2. Non-ischemic cardiomyopathy - last EF was normal. I do feel it it's important to slow her HR down to prevent recurrence of tachy-mediated cardiomyopathy. She is currently euvolemic. 3. Essential hypertension - recheck BP 140/90. I asked her to monitor at home. If this remains persistently elevated, she will need an additional agent for BP control. However, she is hesitant about adding additional medicine at this time so will follow on the verapamil.  Disposition: F/u with Dr. Rayann Heman or Roderic Palau in the [facility] ASAP.   Medication Adjustments/Labs and Tests Ordered: Current medicines are reviewed at length with the patient today.  Concerns regarding medicines are outlined above. Medication changes, Labs and Tests ordered today are summarized above and listed in the Patient Instructions accessible in Encounters.   Raechel Ache PA-C  05/01/2016 12:13 PM    [facility]  [facility] [address], [place], [place]  [address] Phone: [phone]; Fax: [phone]

## 2016-05-01 ENCOUNTER — Encounter: Payer: Self-pay | Admitting: Physician Assistant

## 2016-05-01 ENCOUNTER — Ambulatory Visit: Payer: Medicare Other | Admitting: Cardiology

## 2016-05-01 ENCOUNTER — Ambulatory Visit (INDEPENDENT_AMBULATORY_CARE_PROVIDER_SITE_OTHER): Payer: Medicare Other | Admitting: Physician Assistant

## 2016-05-01 VITALS — BP 140/80 | HR 120 | Ht 62.0 in | Wt 157.4 lb

## 2016-05-01 DIAGNOSIS — I480 Paroxysmal atrial fibrillation: Secondary | ICD-10-CM

## 2016-05-01 DIAGNOSIS — I10 Essential (primary) hypertension: Secondary | ICD-10-CM | POA: Diagnosis not present

## 2016-05-01 DIAGNOSIS — I429 Cardiomyopathy, unspecified: Secondary | ICD-10-CM

## 2016-05-01 DIAGNOSIS — I428 Other cardiomyopathies: Secondary | ICD-10-CM

## 2016-05-01 MED ORDER — VERAPAMIL HCL ER 360 MG PO CP24: 360.0000 mg | ORAL_CAPSULE | Freq: Every day | ORAL | Status: DC

## 2016-05-01 NOTE — Patient Instructions (Signed)
Medication Instructions:  Your physician has recommended you make the following change in your medication:  1.  STOP the Dilitazem 2.  START the Verapamil 360 mg taking 1 tablet daily   Labwork: None ordered  Testing/Procedures: None ordered  Follow-Up: Your physician recommends that you schedule a follow-up appointment in: ASAP WITH DR. ALLRED OR DONNA CARROLL IN THE AFIB CLINIC   Any Other Special Instructions Will Be Listed Below (If Applicable).     If you need a refill on your cardiac medications before your next appointment, please call your pharmacy.

## 2016-05-02 NOTE — Progress Notes (Signed)
She is going to need to go back on Tikosyn.  Needs followup very soon with me or EP.  She does not need to be in rapid afib long-term.  I will work her in at [facility] next week if cannot see EP.

## 2016-05-05 ENCOUNTER — Ambulatory Visit (HOSPITAL_COMMUNITY)
Admission: RE | Admit: 2016-05-05 | Discharge: 2016-05-05 | Disposition: A | Discharge status: Home / Self Care | Payer: Medicare Other | Source: Ambulatory Visit | Attending: Nurse Practitioner | Admitting: Nurse Practitioner

## 2016-05-05 ENCOUNTER — Encounter (HOSPITAL_COMMUNITY): Payer: Self-pay | Admitting: Nurse Practitioner

## 2016-05-05 VITALS — BP 118/64 | HR 114 | Ht 62.0 in | Wt 156.0 lb

## 2016-05-05 DIAGNOSIS — J45909 Unspecified asthma, uncomplicated: Secondary | ICD-10-CM | POA: Diagnosis not present

## 2016-05-05 DIAGNOSIS — Z7901 Long term (current) use of anticoagulants: Secondary | ICD-10-CM | POA: Diagnosis not present

## 2016-05-05 DIAGNOSIS — Z87891 Personal history of nicotine dependence: Secondary | ICD-10-CM | POA: Diagnosis not present

## 2016-05-05 DIAGNOSIS — I481 Persistent atrial fibrillation: Secondary | ICD-10-CM | POA: Diagnosis present

## 2016-05-05 DIAGNOSIS — I4819 Other persistent atrial fibrillation: Secondary | ICD-10-CM

## 2016-05-05 DIAGNOSIS — H409 Unspecified glaucoma: Secondary | ICD-10-CM | POA: Diagnosis not present

## 2016-05-05 DIAGNOSIS — I10 Essential (primary) hypertension: Secondary | ICD-10-CM | POA: Insufficient documentation

## 2016-05-05 NOTE — Progress Notes (Signed)
Patient ID: Maria Arnold, female   DOB: 1946-07-19, 70 y.o.   MRN: [id number]     Primary Care Physician: Vickii Penna., MD Referring Physician: Dr. Rayann Heman Cardiologist: Dr. Thea Silversmith Maria Arnold is a 70 y.o. female with a h/o afib that had her second ablation 4/16. She had been on tikosyn prior to ablation. She was seen in f/u by Dr. Rayann Heman in March 2017 and he was concerned with her noncompliance with no  f/u from ablation and no one had been following her tikosyn due to several cancellation of appointments. She had been in SR since the ablation. He stopped tikosyn for lack of afib and safety concerns. She did go back to Afib 1-2 months later.   She is now in afib with rvr.  States at home that v rates are in the 80's.She was seen recently by Eugenia Mcalpine, PA and had afib with RVR, she was given option of staying on cardizem or starting verapamil and she chose to stay on cardizem. 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 Her cardiologist, Dr. Aundra Dubin wants her to get back in SR and  has discussed restarting tikosyn. The pt is going to the beach next week and cannot do anything at this time but is willing to return in a couple of weeks to discuss going into the hospital for [place]. She is also interested in an third ablation but maintained SR very well on tikosyn and that might be her best option.  Today, she denies symptoms of palpitations, chest pain, shortness of breath, orthopnea, PND, lower extremity edema, dizziness, presyncope, syncope, or neurologic sequela. The patient is tolerating medications without difficulties and is otherwise without complaint today.   Past Medical History  Diagnosis Date  . NICM (nonischemic cardiomyopathy) ([place])     a. EF 30-35% in 2012 -> LHC (2/12): No coronary disease, Cardiomyopathy felt either tachycardia-mediated or Takotsubo (emotional  shock the week before hospitalization). b. f/u echo 05/2011 with EF 55-60%, normal RV, normal valves.   . Asthma   . HTN (hypertension)   . Foot pain     From bone spur  . Paroxysmal atrial fibrillation (HCC)     a. AF ablation x 2. b. Previously on Tikosyn.  . Beta-blocker intolerance   . History of narrow angle glaucoma    Past Surgical History  Procedure Laterality Date  . Nasal sinus surgery  1980's  . Dilation and curettage of uterus    . Laparoscopic abdominal exploration  1980's  . Tee without cardioversion  09/21/2012    Procedure: TRANSESOPHAGEAL ECHOCARDIOGRAM (TEE);  Surgeon: Thayer Headings, MD;  Location: [facility] ENDOSCOPY;  Service: Cardiovascular;  Laterality: N/A;  . Atrial fibrillation ablation N/A 09/22/2012    Procedure: ATRIAL FIBRILLATION ABLATION;  Surgeon: [name], MD;  Location: [facility] CATH LAB;  Service: Cardiovascular;  Laterality: N/A;  . Atrial fibrillation ablation  02/12/2015  . Tonsillectomy and adenoidectomy  1953  . Cardiac catheterization  12/2010  . Atrial fibrillation ablation N/A 02/12/2015    Procedure: ATRIAL FIBRILLATION ABLATION;  Surgeon: [name], MD;  Location: [facility] CATH LAB;  Service: Cardiovascular;  Laterality: N/A;  . Tee without cardioversion N/A 02/12/2015    Procedure: TRANSESOPHAGEAL ECHOCARDIOGRAM (TEE);  Surgeon: Josue Hector, MD;  Location: [facility] ENDOSCOPY;  Service: Cardiovascular;  Laterality: N/A;    Current Outpatient Prescriptions  Medication  Sig Dispense Refill  . apixaban (ELIQUIS) 5 MG TABS tablet Take 1 tablet (5 mg total) by mouth 2 (two) times daily. 180 tablet 1  . Ascorbic Acid (VITAMIN C) 500 MG tablet Take 500 mg by mouth daily.      . Cyanocobalamin (VITAMIN B-12 CR PO) Take 3 tablets by mouth daily.     Marland Kitchen diltiazem (DILACOR XR) 240 MG 24 hr capsule Take 240 mg by mouth daily.    Marland Kitchen losartan (COZAAR) 50 MG tablet Take 1 tablet (50 mg total) by mouth 2 (two) times daily. 180 tablet 3  . Multiple Vitamin (MULTIVITAMIN WITH  MINERALS) TABS Take 1 tablet by mouth daily.    Marland Kitchen neomycin-polymyxin b-dexamethasone (MAXITROL) 3.5-10000-0.1 OINT PLACE 1 APPLICATION INTO ONE EYE NIGHTLY (LEFT EYE)  3   No current facility-administered medications for this encounter.    Allergies  Allergen Reactions  . Lisinopril Cough    Social History   Social History  . Marital Status: Married    Spouse Name: N/A  . Number of Children: 2  . Years of Education: N/A   Occupational History  .  Other   Social History Main Topics  . Smoking status: Former Smoker -- 11 years    Types: Cigarettes  . Smokeless tobacco: Never Used     Comment: "quit smoking in 1976"  . Alcohol Use: 4.2 oz/week    7 Glasses of wine per week  . Drug Use: No  . Sexual Activity: Yes   Other Topics Concern  . Not on file   Social History Narrative   Lives in [place] with husband.  2 children, 5 grandchildren.  Nonsmoker, occasional ETOH.     Family History  Problem Relation Age of Onset  . Coronary artery disease Neg Hx     Premature  . Prostate cancer Father   . Skin cancer Father   . Emphysema Mother     ROS- All systems are reviewed and negative except as per the HPI above  Physical Exam: Filed Vitals:   05/05/16 1029  BP: 118/64  Pulse: 114  Height: '5\' 2"'$  (1.575 m)  Weight: 156 lb (70.761 kg)    GEN- The patient is well appearing, alert and oriented x 3 today.   Head- normocephalic, atraumatic Eyes-  Sclera clear, conjunctiva pink Ears- hearing intact Oropharynx- clear Neck- supple, no JVP Lymph- no cervical lymphadenopathy Lungs- Clear to ausculation bilaterally, normal work of breathing Heart- iregular rate and rhythm, no murmurs, rubs or gallops, PMI not laterally displaced GI- soft, NT, ND, + BS Extremities- no clubbing, cyanosis, or edema MS- no significant deformity or atrophy Skin- no rash or lesion Psych- euthymic mood, full affect Neuro- strength and sensation are intact  EKG- afib at 114 bpm, qrs int  84 ms, qtc 463 bpm Epic records reviewed  Assessment and Plan: 1. Persistent afib Pt is minimally symptomatic but has had possible TMC in past Dr. Aundra Dubin would prefer her to be in SR She will return in a few weeks to discuss re loading of tikosyn but is planning to go to the beach next week STRESSED to pt she has to have very close f/u if put back on tikosyn and cannot cancel appointments V rate is not optimal today but at home in the 80's so will not increase rate control Continue with apixaban 5 mg bid  F/u in 2 weeks  Butch Penny C. [facility] [address] [place], [place] [address]  [phone]   

## 2016-05-06 ENCOUNTER — Telehealth: Payer: Self-pay | Admitting: Cardiology

## 2016-05-06 NOTE — Telephone Encounter (Signed)
Pt inquiring as to who she will be referred to once Dr. Aundra Dubin goes permantely to the [facility]F Clinic-wants to talk to Anne-aware she isn't in today

## 2016-05-07 ENCOUNTER — Inpatient Hospital Stay (HOSPITAL_COMMUNITY): Admission: RE | Admit: 2016-05-07 | Payer: Medicare Other | Source: Ambulatory Visit

## 2016-05-07 ENCOUNTER — Other Ambulatory Visit (HOSPITAL_COMMUNITY): Payer: Medicare Other

## 2016-05-08 ENCOUNTER — Telehealth: Payer: Self-pay | Admitting: Cardiology

## 2016-05-08 ENCOUNTER — Ambulatory Visit (HOSPITAL_COMMUNITY)
Admission: RE | Admit: 2016-05-08 | Discharge: 2016-05-08 | Disposition: A | Discharge status: Home / Self Care | Payer: Medicare Other | Source: Ambulatory Visit | Attending: Nurse Practitioner | Admitting: Nurse Practitioner

## 2016-05-08 DIAGNOSIS — I119 Hypertensive heart disease without heart failure: Secondary | ICD-10-CM | POA: Diagnosis not present

## 2016-05-08 DIAGNOSIS — I481 Persistent atrial fibrillation: Secondary | ICD-10-CM | POA: Insufficient documentation

## 2016-05-08 DIAGNOSIS — I4819 Other persistent atrial fibrillation: Secondary | ICD-10-CM

## 2016-05-08 DIAGNOSIS — I059 Rheumatic mitral valve disease, unspecified: Secondary | ICD-10-CM | POA: Diagnosis not present

## 2016-05-08 DIAGNOSIS — I071 Rheumatic tricuspid insufficiency: Secondary | ICD-10-CM | POA: Diagnosis not present

## 2016-05-08 DIAGNOSIS — I4891 Unspecified atrial fibrillation: Secondary | ICD-10-CM | POA: Diagnosis present

## 2016-05-08 LAB — ECHOCARDIOGRAM COMPLETE
E decel time: 180 msec
E/e' ratio: 7.32
FS: 37 % (ref 28–44)
IVS/LV PW RATIO, ED: 0.87
LA ID, A-P, ES: 35 mm
LA diam end sys: 35 mm
LA diam index: 1.96 cm/m2
LA vol A4C: 41.3 ml
LA vol index: 29.6 mL/m2
LA vol: 52.8 mL
LV E/e' medial: 7.32
LV E/e'average: 7.32
LV PW d: 11.7 mm — AB (ref 0.6–1.1)
LV dias vol index: 23 mL/m2
LV dias vol: 42 mL — AB (ref 46–106)
LV e' LATERAL: 11.6 cm/s
LV sys vol index: 12 mL/m2
LV sys vol: 22 mL (ref 14–42)
LVOT SV: 56 mL
LVOT VTI: 17.7 cm
LVOT area: 3.14 cm2
LVOT diameter: 20 mm
LVOT peak vel: 90.2 cm/s
MV Dec: 180
MV Peak grad: 3 mmHg
MV pk E vel: 84.9 m/s
Reg peak vel: 238 cm/s
Simpson's disk: 48
Stroke v: 20 ml
TDI e' lateral: 11.6
TDI e' medial: 7.13
TR max vel: 238 cm/s

## 2016-05-08 NOTE — Telephone Encounter (Signed)
New message      The pt was wanting the nurse to go over the results of Echo from the other day

## 2016-05-08 NOTE — Telephone Encounter (Signed)
See phone note 05/08/16

## 2016-05-08 NOTE — Telephone Encounter (Signed)
Dr Aundra Dubin reviewed echo done today, looks okay, EF remains normal.

## 2016-05-08 NOTE — Progress Notes (Signed)
Echocardiogram 2D Echocardiogram has been performed.  Tresa Res 05/08/2016, 12:00 PM

## 2016-05-08 NOTE — Telephone Encounter (Signed)
Pt.notified

## 2016-05-21 ENCOUNTER — Encounter (HOSPITAL_COMMUNITY): Payer: Self-pay | Admitting: Nurse Practitioner

## 2016-05-21 ENCOUNTER — Ambulatory Visit (HOSPITAL_COMMUNITY)
Admission: RE | Admit: 2016-05-21 | Discharge: 2016-05-21 | Disposition: A | Discharge status: Home / Self Care | Payer: Medicare Other | Source: Ambulatory Visit | Attending: Nurse Practitioner | Admitting: Nurse Practitioner

## 2016-05-21 VITALS — BP 138/78 | HR 116 | Ht 62.0 in | Wt 156.0 lb

## 2016-05-21 DIAGNOSIS — Z7901 Long term (current) use of anticoagulants: Secondary | ICD-10-CM | POA: Diagnosis not present

## 2016-05-21 DIAGNOSIS — I480 Paroxysmal atrial fibrillation: Secondary | ICD-10-CM | POA: Diagnosis not present

## 2016-05-21 DIAGNOSIS — Z79899 Other long term (current) drug therapy: Secondary | ICD-10-CM | POA: Diagnosis not present

## 2016-05-21 DIAGNOSIS — I481 Persistent atrial fibrillation: Secondary | ICD-10-CM | POA: Insufficient documentation

## 2016-05-21 DIAGNOSIS — I10 Essential (primary) hypertension: Secondary | ICD-10-CM | POA: Diagnosis not present

## 2016-05-21 DIAGNOSIS — Z87891 Personal history of nicotine dependence: Secondary | ICD-10-CM | POA: Insufficient documentation

## 2016-05-21 DIAGNOSIS — Z888 Allergy status to other drugs, medicaments and biological substances status: Secondary | ICD-10-CM | POA: Diagnosis not present

## 2016-05-21 DIAGNOSIS — I4819 Other persistent atrial fibrillation: Secondary | ICD-10-CM

## 2016-05-21 NOTE — Progress Notes (Signed)
Patient ID: Maria Arnold, female   DOB: [birth year], 70 y.o.   MRN: [id number]     Primary Care Physician: Vickii Penna., MD Referring Physician: Dr. Rayann Heman Cardiologist: Dr. Thea Silversmith Maria Arnold is a 70 y.o. female with a h/o afib that had her second ablation 4/16. She had been on tikosyn prior to ablation. She was seen in f/u by Dr. Rayann Heman in March 2017 and he was concerned with her noncompliance with no  f/u from ablation and no one had been following her tikosyn due to several cancellation of appointments. She had been in SR since the ablation. He stopped tikosyn for lack of afib and safety concerns. She did go back to Afib 1-2 months later.   She is now in afib with rvr.  States at home that v rates are in the 80's.She was seen recently by Eugenia Mcalpine, PA and had afib with RVR, she was given option of staying on cardizem or starting verapamil and she chose to stay on cardizem. 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 Her cardiologist, Dr. Aundra Dubin wants her to get back in SR and  has discussed restarting tikosyn. The pt is going to the beach next week and cannot do anything at this time but is willing to return in a couple of weeks to discuss going into the hospital for [place]. She is also interested in an third ablation but maintained SR very well on tikosyn and that might be her best option.  She returns to afib clinic 7/13. She states that she had an uneventful beach trip as far as her heart was concerned. HR stayed less than 90 and BP in the 130's. HR and BP up today but she is very anxious. BP rechecked at 138/78.She states that her son is coming in off leave and she will be going back to the beach next week. She wants to delay Tikosyn hospitalization until week of 7/24. Echo form 6/30 showed normal LV systolic function, mild LAE; mild TR.  Today, she denies symptoms of  palpitations, chest pain, shortness of breath, orthopnea, PND, lower extremity edema, dizziness, presyncope, syncope, or neurologic sequela. The patient is tolerating medications without difficulties and is otherwise without complaint today.   Past Medical History  Diagnosis Date  . NICM (nonischemic cardiomyopathy) ([place])     a. EF 30-35% in 2012 -> LHC (2/12): No coronary disease, Cardiomyopathy felt either tachycardia-mediated or Takotsubo (emotional shock the week before hospitalization). b. f/u echo 05/2011 with EF 55-60%, normal RV, normal valves.   . Asthma   . HTN (hypertension)   . Foot pain     From bone spur  . Paroxysmal atrial fibrillation (HCC)     a. AF ablation x 2. b. Previously on Tikosyn.  . Beta-blocker intolerance   . History of narrow angle glaucoma    Past Surgical History  Procedure Laterality Date  . Nasal sinus surgery  1980's  . Dilation and curettage of uterus    . Laparoscopic abdominal exploration  1980's  . Tee without cardioversion  09/21/2012    Procedure: TRANSESOPHAGEAL ECHOCARDIOGRAM (TEE);  Surgeon: Thayer Headings, MD;  Location: [facility] ENDOSCOPY;  Service: Cardiovascular;  Laterality: N/A;  . Atrial fibrillation ablation N/A 09/22/2012    Procedure: ATRIAL FIBRILLATION ABLATION;  Surgeon: [name], MD;  Location: [facility] CATH LAB;  Service: Cardiovascular;  Laterality:  N/A;  . Atrial fibrillation ablation  02/12/2015  . Tonsillectomy and adenoidectomy  1953  . Cardiac catheterization  12/2010  . Atrial fibrillation ablation N/A 02/12/2015    Procedure: ATRIAL FIBRILLATION ABLATION;  Surgeon: [name], MD;  Location: [facility] CATH LAB;  Service: Cardiovascular;  Laterality: N/A;  . Tee without cardioversion N/A 02/12/2015    Procedure: TRANSESOPHAGEAL ECHOCARDIOGRAM (TEE);  Surgeon: Josue Hector, MD;  Location: [facility] ENDOSCOPY;  Service: Cardiovascular;  Laterality: N/A;    Current Outpatient Prescriptions  Medication Sig Dispense Refill  . apixaban (ELIQUIS) 5  MG TABS tablet Take 1 tablet (5 mg total) by mouth 2 (two) times daily. 180 tablet 1  . Ascorbic Acid (VITAMIN C) 500 MG tablet Take 500 mg by mouth daily.      . Cyanocobalamin (VITAMIN B-12 CR PO) Take 3 tablets by mouth daily.     Marland Kitchen diltiazem (DILACOR XR) 240 MG 24 hr capsule Take 240 mg by mouth daily.    Marland Kitchen losartan (COZAAR) 50 MG tablet Take 1 tablet (50 mg total) by mouth 2 (two) times daily. 180 tablet 3  . Multiple Vitamin (MULTIVITAMIN WITH MINERALS) TABS Take 1 tablet by mouth daily.    Marland Kitchen neomycin-polymyxin b-dexamethasone (MAXITROL) 3.5-10000-0.1 OINT PLACE 1 APPLICATION INTO ONE EYE NIGHTLY (LEFT EYE)  3   No current facility-administered medications for this encounter.    Allergies  Allergen Reactions  . Lisinopril Cough    Social History   Social History  . Marital Status: Married    Spouse Name: N/A  . Number of Children: 2  . Years of Education: N/A   Occupational History  .  Other   Social History Main Topics  . Smoking status: Former Smoker -- 11 years    Types: Cigarettes  . Smokeless tobacco: Never Used     Comment: "quit smoking in 1976"  . Alcohol Use: 4.2 oz/week    7 Glasses of wine per week  . Drug Use: No  . Sexual Activity: Yes   Other Topics Concern  . Not on file   Social History Narrative   Lives in [place] with husband.  2 children, 5 grandchildren.  Nonsmoker, occasional ETOH.     Family History  Problem Relation Age of Onset  . Coronary artery disease Neg Hx     Premature  . Prostate cancer Father   . Skin cancer Father   . Emphysema Mother     ROS- All systems are reviewed and negative except as per the HPI above  Physical Exam: Filed Vitals:   05/21/16 1540 05/21/16 1622  BP: 174/90 138/78  Pulse: 116   Height: '5\' 2"'$  (1.575 m)   Weight: 156 lb (70.761 kg)     GEN- The patient is well appearing, alert and oriented x 3 today.   Head- normocephalic, atraumatic Eyes-  Sclera clear, conjunctiva pink Ears- hearing  intact Oropharynx- clear Neck- supple, no JVP Lymph- no cervical lymphadenopathy Lungs- Clear to ausculation bilaterally, normal work of breathing Heart- iregular rate and rhythm, no murmurs, rubs or gallops, PMI not laterally displaced GI- soft, NT, ND, + BS Extremities- no clubbing, cyanosis, or edema MS- no significant deformity or atrophy Skin- no rash or lesion Psych- euthymic mood, full affect Neuro- strength and sensation are intact  EKG- afib at 116 bpm, qrs int 82 ms, qtc 436 bpm Epic records reviewed Echo- 6/30-Left ventricle: The cavity size was normal. Wall thickness was  normal. Systolic function was normal. The estimated ejection  fraction was in the range of 55% to 60%. Wall motion was normal;  there were no regional wall motion abnormalities. - Mitral valve: Calcified annulus. - Left atrium: The atrium was mildly dilated.  Impressions:  - Normal LV systolic function. mild LAE; mild TR.  Assessment and Plan: 1. Persistent afib Pt is minimally symptomatic but has had possible TMC in past Most recent echo with normal EF Dr. Aundra Dubin would prefer her to be in SR She will return week of 7/24 to have bmet/mag drawn and  to discuss hospitalization for  re loading of tikosyn but is planning to go back to the beach on Monday 7/17 STRESSED to pt she has to have very close f/u if put back on tikosyn and cannot cancel appointments Continue diltiazem for rate control Continue with apixaban 5 mg bid, no missed doses  2. HTN  Continue losartan  F/u 7/24  Geroge Baseman. Tanita Palinkas, [facility] [address] [place], [place] [address] [phone]

## 2016-05-22 ENCOUNTER — Telehealth: Payer: Self-pay | Admitting: Cardiology

## 2016-05-22 NOTE — Telephone Encounter (Signed)
New Message:   Please call,she wants to discuss her ov with Doristine Devoid and her medicine.

## 2016-05-22 NOTE — Telephone Encounter (Signed)
I spoke with patient about follow up appt with Roderic Palau 06/01/16, pt plans to keep that appointment. Pt would like to speak with Dr Maria Arnold about medications and treatment for a fib.  I will forward to Dr Maria Arnold for review.

## 2016-06-01 ENCOUNTER — Other Ambulatory Visit (HOSPITAL_COMMUNITY): Payer: Medicare Other | Admitting: Nurse Practitioner

## 2016-06-03 ENCOUNTER — Ambulatory Visit (HOSPITAL_COMMUNITY)
Admission: RE | Admit: 2016-06-03 | Discharge: 2016-06-03 | Disposition: A | Discharge status: Home / Self Care | Payer: Medicare Other | Source: Ambulatory Visit | Attending: Nurse Practitioner | Admitting: Nurse Practitioner

## 2016-06-03 NOTE — Progress Notes (Signed)
Pt decided once here for appointment for labs that she is not quite ready to pursue tikosyn due to husband's health condition. Patient will call back when she is ready to pursue.

## 2016-06-05 ENCOUNTER — Telehealth: Payer: Self-pay | Admitting: Cardiology

## 2016-06-05 MED ORDER — APIXABAN 5 MG PO TABS: 5.0000 mg | ORAL_TABLET | Freq: Two times a day (BID) | ORAL | 3 refills | Status: DC

## 2016-06-05 MED ORDER — LOSARTAN POTASSIUM 50 MG PO TABS: 50.0000 mg | ORAL_TABLET | Freq: Two times a day (BID) | ORAL | 3 refills | Status: DC

## 2016-06-05 NOTE — Telephone Encounter (Signed)
New Message  Pt call requesting to speak with an RN about her med refills. Pt state she has some questions. Please call back to discuss   Pt also stated she would like a call back from [facility] once she returned

## 2016-06-05 NOTE — Telephone Encounter (Signed)
Pt simply calling for a refill of Eliquis and Losartan to be sent into her confirmed pharmacy of choice, for a 90-day supply.  Informed the pt that I will send both of these meds in as instructed.  Pt verbalized understanding and agrees with this plan.

## 2016-06-10 ENCOUNTER — Ambulatory Visit: Payer: Medicare Other

## 2016-06-19 ENCOUNTER — Other Ambulatory Visit: Payer: Self-pay

## 2016-06-19 MED ORDER — APIXABAN 5 MG PO TABS: 5.0000 mg | ORAL_TABLET | Freq: Two times a day (BID) | ORAL | 0 refills | Status: DC

## 2016-07-14 ENCOUNTER — Ambulatory Visit: Payer: Medicare Other

## 2016-07-15 ENCOUNTER — Inpatient Hospital Stay (HOSPITAL_COMMUNITY)
Admission: AD | Admit: 2016-07-15 | Discharge: 2016-07-18 | DRG: 310 | Disposition: A | Discharge status: Home / Self Care | Payer: Medicare Other | Source: Ambulatory Visit | Attending: Internal Medicine | Admitting: Internal Medicine

## 2016-07-15 ENCOUNTER — Other Ambulatory Visit: Payer: Self-pay

## 2016-07-15 ENCOUNTER — Ambulatory Visit (INDEPENDENT_AMBULATORY_CARE_PROVIDER_SITE_OTHER): Payer: Medicare Other | Admitting: Pharmacist

## 2016-07-15 ENCOUNTER — Encounter (HOSPITAL_COMMUNITY): Payer: Self-pay

## 2016-07-15 DIAGNOSIS — I481 Persistent atrial fibrillation: Secondary | ICD-10-CM

## 2016-07-15 DIAGNOSIS — Z7901 Long term (current) use of anticoagulants: Secondary | ICD-10-CM

## 2016-07-15 DIAGNOSIS — I4819 Other persistent atrial fibrillation: Secondary | ICD-10-CM

## 2016-07-15 DIAGNOSIS — Z87891 Personal history of nicotine dependence: Secondary | ICD-10-CM | POA: Diagnosis not present

## 2016-07-15 DIAGNOSIS — I10 Essential (primary) hypertension: Secondary | ICD-10-CM | POA: Diagnosis present

## 2016-07-15 DIAGNOSIS — I4891 Unspecified atrial fibrillation: Secondary | ICD-10-CM | POA: Diagnosis not present

## 2016-07-15 DIAGNOSIS — Z79899 Other long term (current) drug therapy: Secondary | ICD-10-CM

## 2016-07-15 DIAGNOSIS — I480 Paroxysmal atrial fibrillation: Secondary | ICD-10-CM | POA: Diagnosis present

## 2016-07-15 DIAGNOSIS — H4020X0 Unspecified primary angle-closure glaucoma, stage unspecified: Secondary | ICD-10-CM | POA: Diagnosis present

## 2016-07-15 LAB — MAGNESIUM: Magnesium: 1.9 mg/dL (ref 1.7–2.4)

## 2016-07-15 LAB — BASIC METABOLIC PANEL
Anion gap: 6 (ref 5–15)
BUN: 14 mg/dL (ref 6–20)
CO2: 30 mmol/L (ref 22–32)
Calcium: 9.6 mg/dL (ref 8.9–10.3)
Chloride: 102 mmol/L (ref 101–111)
Creatinine, Ser: 0.59 mg/dL (ref 0.44–1.00)
GFR calc Af Amer: >60 mL/min (ref >60)
GFR calc non Af Amer: >60 mL/min (ref >60)
Glucose, Bld: 108 mg/dL — ABNORMAL HIGH (ref 65–99)
Potassium: 4.3 mmol/L (ref 3.5–5.1)
Sodium: 138 mmol/L (ref 135–145)

## 2016-07-15 MED ORDER — SODIUM CHLORIDE 0.9% FLUSH
3.0000 mL | Freq: Two times a day (BID) | INTRAVENOUS | Status: DC
  Administered 2016-07-16 – 2016-07-17 (×2): 3 mL via INTRAVENOUS

## 2016-07-15 MED ORDER — VITAMIN C 500 MG PO TABS
500.0000 mg | ORAL_TABLET | Freq: Every day | ORAL | Status: DC
  Administered 2016-07-16 – 2016-07-18 (×3): 500 mg via ORAL
  Filled 2016-07-15 (×3): qty 1

## 2016-07-15 MED ORDER — ADULT MULTIVITAMIN W/MINERALS CH
1.0000 | ORAL_TABLET | Freq: Every day | ORAL | Status: DC
  Administered 2016-07-15 – 2016-07-18 (×4): 1 via ORAL
  Filled 2016-07-15 (×4): qty 1

## 2016-07-15 MED ORDER — SODIUM CHLORIDE 0.9 % IV SOLN
250.0000 mL | INTRAVENOUS | Status: DC | PRN
  Administered 2016-07-17: 250 mL via INTRAVENOUS

## 2016-07-15 MED ORDER — DOFETILIDE 250 MCG PO CAPS
250.0000 ug | ORAL_CAPSULE | Freq: Two times a day (BID) | ORAL | Status: DC
  Administered 2016-07-15 – 2016-07-16 (×3): 250 ug via ORAL
  Filled 2016-07-15 (×3): qty 1

## 2016-07-15 MED ORDER — DILTIAZEM HCL ER COATED BEADS 240 MG PO CP24
240.0000 mg | ORAL_CAPSULE | Freq: Every day | ORAL | Status: DC
  Administered 2016-07-15: 240 mg via ORAL
  Filled 2016-07-15 (×4): qty 1

## 2016-07-15 MED ORDER — LOSARTAN POTASSIUM 50 MG PO TABS
50.0000 mg | ORAL_TABLET | Freq: Two times a day (BID) | ORAL | Status: DC
  Administered 2016-07-15 – 2016-07-18 (×6): 50 mg via ORAL
  Filled 2016-07-15 (×6): qty 1

## 2016-07-15 MED ORDER — APIXABAN 5 MG PO TABS
5.0000 mg | ORAL_TABLET | Freq: Two times a day (BID) | ORAL | Status: DC
  Administered 2016-07-15 – 2016-07-18 (×6): 5 mg via ORAL
  Filled 2016-07-15 (×6): qty 1

## 2016-07-15 MED ORDER — SODIUM CHLORIDE 0.9% FLUSH: 3.0000 mL | INTRAVENOUS | Status: DC | PRN

## 2016-07-15 NOTE — H&P (Signed)
ELECTROPHYSIOLOGY HISTORY AND PHYSICAL     Patient ID: Maria Arnold MRN: [id number], DOB/AGE: [birth year] 70 y.o.  Admit date: 07/15/2016 Date of Consult: 07/15/2016  Primary Physician: Vickii Penna., MD Primary Cardiologist: Aundra Dubin Electrophysiologist: Allred  CC: here for Tikosyn loading   HPI:  Maria Arnold is a 70 y.o. female with a past medical history significant for hypertension, prior tachycardia induced cardiomyopathy and persistent atrial fibrillation s/p PVI.  She has returned to AF and Dr Aundra Dubin would like to try to restore SR. She was previously on Tikosyn 274mg twice daily and did well. She therefore presents today for Tikosyn initiation. She has been under increased stress lately - her husband has been diagnosed with recurrent prostate cancer and will need radiation. She otherwise feels well without chest pain, shortness of breath, palpitations, LE edema, recent fevers, chills, nausea or vomiting.    Past Medical History:  Diagnosis Date  . Asthma   . Beta-blocker intolerance   . Foot pain    From bone spur  . History of narrow angle glaucoma   . HTN (hypertension)   . NICM (nonischemic cardiomyopathy) ([place]    a. EF 30-35% in 2012 -> LHC (2/12): No coronary disease, Cardiomyopathy felt either tachycardia-mediated or Takotsubo (emotional shock the week before hospitalization). b. f/u echo 05/2011 with EF 55-60%, normal RV, normal valves.   . Paroxysmal atrial fibrillation (HCC)    a. AF ablation x 2. b. Previously on Tikosyn.     Surgical History:  Past Surgical History:  Procedure Laterality Date  . ATRIAL FIBRILLATION ABLATION N/A 09/22/2012   Procedure: ATRIAL FIBRILLATION ABLATION;  Surgeon: JThompson Grayer MD;  [facility];  Service: Cardiovascular;  Laterality: N/A;  . ATRIAL FIBRILLATION ABLATION  02/12/2015  . ATRIAL FIBRILLATION ABLATION N/A 02/12/2015   Procedure: ATRIAL FIBRILLATION ABLATION;  Surgeon: JThompson Grayer MD;  [facility];   Service: Cardiovascular;  Laterality: N/A;  . CARDIAC CATHETERIZATION  12/2010  . DILATION AND CURETTAGE OF UTERUS    . LAPAROSCOPIC ABDOMINAL EXPLORATION  1980's  . NASAL SINUS SURGERY  1980's  . TEE WITHOUT CARDIOVERSION  09/21/2012   Procedure: TRANSESOPHAGEAL ECHOCARDIOGRAM (TEE);  Surgeon: PThayer Headings MD;  [place]  Service: Cardiovascular;  Laterality: N/A;  . TEE WITHOUT CARDIOVERSION N/A 02/12/2015   Procedure: TRANSESOPHAGEAL ECHOCARDIOGRAM (TEE);  Surgeon: PJosue Hector MD;  [place]  Service: Cardiovascular;  Laterality: N/A;  . TONSILLECTOMY AND ADENOIDECTOMY  1953     Prescriptions Prior to Admission  Medication Sig Dispense Refill Last Dose  . apixaban (ELIQUIS) 5 MG TABS tablet Take 1 tablet (5 mg total) by mouth 2 (two) times daily. 10 tablet 0   . Ascorbic Acid (VITAMIN C) 500 MG tablet Take 500 mg by mouth daily.     Taking  . Cyanocobalamin (VITAMIN B-12 CR PO) Take 3 tablets by mouth daily.    Taking  . diltiazem (DILACOR XR) 240 MG 24 hr capsule Take 240 mg by mouth daily.   Taking  . losartan (COZAAR) 50 MG tablet Take 1 tablet (50 mg total) by mouth 2 (two) times daily. 180 tablet 3   . Multiple Vitamin (MULTIVITAMIN WITH MINERALS) TABS Take 1 tablet by mouth daily.   Taking  . neomycin-polymyxin b-dexamethasone (MAXITROL) 3.5-10000-0.1 OINT PLACE 1 APPLICATION INTO ONE EYE NIGHTLY (LEFT EYE)  3 Taking    Inpatient Medications:   Allergies:  Allergies  Allergen Reactions  . Lisinopril Cough  Social History   Social History  . Marital status: Married    Spouse name: N/A  . Number of children: 2  . Years of education: N/A   Occupational History  .  Other   Social History Main Topics  . Smoking status: Former Smoker    Years: 11.00    Types: Cigarettes  . Smokeless tobacco: Never Used     Comment: "quit smoking in 1976"  . Alcohol use 4.2 oz/week    7 Glasses of wine per week  . Drug use: No  . Sexual activity:  Yes   Other Topics Concern  . Not on file   Social History Narrative   Lives in [place] with husband.  2 children, 5 grandchildren.  Nonsmoker, occasional ETOH.      Family History  Problem Relation Age of Onset  . Coronary artery disease Neg Hx     Premature  . Prostate cancer Father   . Skin cancer Father   . Emphysema Mother      Review of Systems: All other systems reviewed and are otherwise negative except as noted above.  Physical Exam: Vitals:   07/15/16 1559  BP: 123/70  Pulse: 80  Resp: 18  Temp: 97.7 F (36.5 C)  TempSrc: Oral  SpO2: 98%    GEN- The patient is well appearing, alert and oriented x 3 today.   HEENT: normocephalic, atraumatic; sclera clear, conjunctiva pink; hearing intact; oropharynx clear; neck supple  Lungs- Clear to ausculation bilaterally, normal work of breathing.  No wheezes, rales, rhonchi Heart- Irregular rate and rhythm  GI- soft, non-tender, non-distended, bowel sounds present  Extremities- no clubbing, cyanosis, or edema  MS- no significant deformity or atrophy Skin- warm and dry, no rash or lesion Psych- euthymic mood, full affect Neuro- strength and sensation are intact  Labs:   Lab Results  Component Value Date   WBC 7.4 04/15/2016   HGB 14.7 04/15/2016   HCT 42.7 04/15/2016   MCV 101.2 (H) 04/15/2016   PLT 299 04/15/2016    Recent Labs Lab 07/15/16 1103  NA 138  K 4.3  CL 102  CO2 30  BUN 14  CREATININE 0.59  CALCIUM 9.6  GLUCOSE 108*      Radiology/Studies: No results found.  EKG: atrial fibrillation, QTc 459mec per Dr AJackalyn Lombardreview today in office   Assessment/Plan; 1.  Persistent atrial fibrillation The patient has persistent atrial fibrillation with previous tachcyardia induced cardiomyopathy.  Dr MAundra Dubinhas seen patient and feels it is important to try to restore SR.  She was previously on Tikosyn 2566m bid and did well. She is reluctant to take higher dose even though renal function would  allow. I have explained that if she fails to convert on 25087mthen she will require repeat hospitalization to load on higher dose (she is flying out of town on Sunday). She is aware and would like to do 250m81mid Continue Eliquis for CHADS2VASC of 3 - she reports compliance for the last 4 weeks without missed doses QTc 453ms64moday in AF and ok to start Tikosyn per Dr AllreJackalyn Lombardew If still in AF on Friday, will need DCCV (scheduled)  2.  HTN Stable No change required today  3.  Prior tachycardia induced cardiomyopathy EF normal by echo 04/2016    Signed, Maria Marshall9/04/2016 4:14 PM Pt interviewed examined and records reviewed Labile HTN and recurrent afib and poor toleration of afib.  Potential contributing facotors incl htn and sleep  apnea,  Never had a sleep study Has fear of dofetilide,and would like 250 mcg  Her husband has been diagnosed with metastatic (?) cancer for which radiation is planned after they return from their anniversary trip to [facility]  Rec 1) dfetilide 250 ( per pt) 2) DCCV on Friday if does not convert 3) outpt sleep study 4) reduce dilt as assoc side effects

## 2016-07-15 NOTE — Progress Notes (Signed)
Due meds not verified, pharmacy paged, told that they would come to do patients meds review and then verify, patient informed will continue to monitor

## 2016-07-15 NOTE — Progress Notes (Addendum)
Patient arrived on the unit from home, assesment completed see flowsheet, placed on tele ccmd notified,patient oriented to room and staff bed in lowest position, call light within reach will continue to monitor

## 2016-07-15 NOTE — Progress Notes (Signed)
Patient ID: Maria Arnold, female   DOB: 27-Jan-1946, 70 y.o.   MRN: [id number]      Primary Care Physician: Vickii Penna., MD Referring Physician: Dr. Rayann Heman Cardiologist: Dr. Thea Silversmith Maria Arnold is a 70 y.o. female who is here today for Tikosyn initiation.  She has a h/o afib with a second ablation 4/16. She had been on tikosyn prior to ablation. She was seen in f/u by Dr. Rayann Heman in March 2017 and he was concerned with her noncompliance with no  f/u from ablation and no one had been following her tikosyn due to several cancellation of appointments. She had been in SR since the ablation. He stopped tikosyn for lack of afib and safety concerns. She did go back to Afib 1-2 months later.  She was seen by Roderic Palau, NP in the Afib clinic in July.  She was in Afib with RVR and on diltiazem.  She was minimally symptomatic with her afib but Dr. Aundra Dubin prefers to get her her back in SR and suggested restarting Tikosyn.  She is here today to start therapy.    Pt educated on potential risks with Tikosyn including QTc prolongation.  She is aware of the importance of not missing any doses and will call the office if she misses more than 2 doses in a row.  She is anticoagulated with Eliquis and reports no missed doses within the past month.  She is currently not taking any QTc prolongating or contraindicated medications.  When she was on Tikosyn previously, her dose was 250 mcg BID but this was reduced from 594mg because she had QTc prolongation with a combination of Tikosyn and moxifloxacin.  She has not tried any other antiarrhythmic therapies.    EKG reviewed by Dr. ARayann Heman  Afib with vent rate of 95 bpm.  QTc 452 in afib but okay per Dr. ARayann Heman    Past Medical History:  Diagnosis Date  . Asthma   . Beta-blocker intolerance   . Foot pain    From bone spur  . History of narrow angle glaucoma   . HTN (hypertension)   . NICM (nonischemic cardiomyopathy) ([place]    a. EF 30-35% in 2012 -> LHC  (2/12): No coronary disease, Cardiomyopathy felt either tachycardia-mediated or Takotsubo (emotional shock the week before hospitalization). b. f/u echo 05/2011 with EF 55-60%, normal RV, normal valves.   . Paroxysmal atrial fibrillation (HCC)    a. AF ablation x 2. b. Previously on Tikosyn.   Past Surgical History:  Procedure Laterality Date  . ATRIAL FIBRILLATION ABLATION N/A 09/22/2012   Procedure: ATRIAL FIBRILLATION ABLATION;  Surgeon: JThompson Grayer MD;  [facility];  Service: Cardiovascular;  Laterality: N/A;  . ATRIAL FIBRILLATION ABLATION  02/12/2015  . ATRIAL FIBRILLATION ABLATION N/A 02/12/2015   Procedure: ATRIAL FIBRILLATION ABLATION;  Surgeon: JThompson Grayer MD;  [facility];  Service: Cardiovascular;  Laterality: N/A;  . CARDIAC CATHETERIZATION  12/2010  . DILATION AND CURETTAGE OF UTERUS    . LAPAROSCOPIC ABDOMINAL EXPLORATION  1980's  . NASAL SINUS SURGERY  1980's  . TEE WITHOUT CARDIOVERSION  09/21/2012   Procedure: TRANSESOPHAGEAL ECHOCARDIOGRAM (TEE);  Surgeon: PThayer Headings MD;  [place]  Service: Cardiovascular;  Laterality: N/A;  . TEE WITHOUT CARDIOVERSION N/A 02/12/2015   Procedure: TRANSESOPHAGEAL ECHOCARDIOGRAM (TEE);  Surgeon: PJosue Hector MD;  [place]  Service: Cardiovascular;  Laterality: N/A;  . [place]   Current  Outpatient Prescriptions  Medication Sig Dispense Refill  . apixaban (ELIQUIS) 5 MG TABS tablet Take 1 tablet (5 mg total) by mouth 2 (two) times daily. 10 tablet 0  . Ascorbic Acid (VITAMIN C) 500 MG tablet Take 500 mg by mouth daily.      . Cyanocobalamin (VITAMIN B-12 CR PO) Take 3 tablets by mouth daily.     Marland Kitchen diltiazem (DILACOR XR) 240 MG 24 hr capsule Take 240 mg by mouth daily.    Marland Kitchen losartan (COZAAR) 50 MG tablet Take 1 tablet (50 mg total) by mouth 2 (two) times daily. 180 tablet 3  . Multiple Vitamin (MULTIVITAMIN WITH MINERALS) TABS Take 1 tablet by mouth daily.     Marland Kitchen neomycin-polymyxin b-dexamethasone (MAXITROL) 3.5-10000-0.1 OINT PLACE 1 APPLICATION INTO ONE EYE NIGHTLY (LEFT EYE)  3   No current facility-administered medications for this visit.     Allergies  Allergen Reactions  . Lisinopril Cough    Assessment and Plan:  1.  Atrial fibrillation: Pt's labs acceptable for starting Tikosyn.  She is aware to report to the hospital for admission.

## 2016-07-16 ENCOUNTER — Other Ambulatory Visit: Payer: Self-pay

## 2016-07-16 LAB — BASIC METABOLIC PANEL
Anion gap: 6 (ref 5–15)
BUN: 15 mg/dL (ref 6–20)
CO2: 28 mmol/L (ref 22–32)
Calcium: 9.4 mg/dL (ref 8.9–10.3)
Chloride: 104 mmol/L (ref 101–111)
Creatinine, Ser: 0.54 mg/dL (ref 0.44–1.00)
GFR calc Af Amer: >60 mL/min (ref >60)
GFR calc non Af Amer: >60 mL/min (ref >60)
Glucose, Bld: 109 mg/dL — ABNORMAL HIGH (ref 65–99)
Potassium: 4 mmol/L (ref 3.5–5.1)
Sodium: 138 mmol/L (ref 135–145)

## 2016-07-16 LAB — MAGNESIUM: Magnesium: 1.9 mg/dL (ref 1.7–2.4)

## 2016-07-16 MED ORDER — DOFETILIDE 250 MCG PO CAPS
500.0000 ug | ORAL_CAPSULE | Freq: Two times a day (BID) | ORAL | Status: DC
  Administered 2016-07-16 – 2016-07-18 (×4): 500 ug via ORAL
  Filled 2016-07-16 (×4): qty 2

## 2016-07-16 MED ORDER — DOFETILIDE 250 MCG PO CAPS: 250.0000 ug | ORAL_CAPSULE | Freq: Once | ORAL | Status: AC

## 2016-07-16 NOTE — Progress Notes (Signed)
Insurance check completed S/W DORIAN @ [facility] # [phone]   TIKOSYN  250 MCG BID (30 )  COVER- YES  CO-PAY- $ 25 % CO-INS  PRIOR APPROVAL- NO  [facility]: [facility]  $ 50.00 DEDUCTIBLE MET- YES  DOFETILIDE : SAME AS ABOVE  MAIL-ORDER MEDICINE FREE

## 2016-07-16 NOTE — Progress Notes (Signed)
    SUBJECTIVE: The patient is doing well today.  At this time, she denies chest pain, shortness of breath, or any new concerns.  CURRENT MEDICATIONS: . apixaban  5 mg Oral BID  . diltiazem  240 mg Oral Daily  . dofetilide  250 mcg Oral BID  . losartan  50 mg Oral BID  . multivitamin with minerals  1 tablet Oral Daily  . sodium chloride flush  3 mL Intravenous Q12H  . vitamin C  500 mg Oral Daily      OBJECTIVE: Physical Exam: Vitals:   07/15/16 1559 07/15/16 1654 07/15/16 2024 07/16/16 0358  BP: 123/70  136/78 (!) 118/59  Pulse: 80  94 78  Resp: '18  18 18  '$ Temp: 97.7 F (36.5 C)  98.2 F (36.8 C) 98.3 F (36.8 C)  TempSrc: Oral  Oral Oral  SpO2: 98%  97% 98%  Weight:  148 lb (67.1 kg)    Height:  '5\' 2"'$  (1.575 m)      Intake/Output Summary (Last 24 hours) at 07/16/16 0831 Last data filed at 07/15/16 2026  Gross per 24 hour  Intake              480 ml  Output                0 ml  Net              480 ml    Telemetry reveals atrial fibrillation   GEN- The patient is well appearing, alert and oriented x 3 today.   Head- normocephalic, atraumatic Eyes-  Sclera clear, conjunctiva pink Ears- hearing intact Oropharynx- clear Neck- supple  Lungs- Clear to ausculation bilaterally, normal work of breathing Heart- Irregular rate and rhythm  GI- soft, NT, ND, + BS Extremities- no clubbing, cyanosis, or edema Skin- no rash or lesion Psych- euthymic mood, full affect Neuro- strength and sensation are intact  LABS: Basic Metabolic Panel:  Recent Labs  07/15/16 1103 07/16/16 0157  NA 138 138  K 4.3 4.0  CL 102 104  CO2 30 28  GLUCOSE 108* 109*  BUN 14 15  CREATININE 0.59 0.54  CALCIUM 9.6 9.4  MG 1.9 1.9    ASSESSMENT AND PLAN:  Active Problems:   Persistent atrial fibrillation (HCC)   1.  Persistent atrial fibrillation The patient has persistent atrial fibrillation with previous tachcyardia induced cardiomyopathy.  Dr Aundra Dubin has seen patient and feels  it is important to try to restore SR.  Dr Rayann Heman discussed at length with patient today rationale for being on 553mg of Tikosyn - she is now willing to take. Will give extra 2586m now and start 5004mtonight.  Continue Eliquis for CHADS2VASC of 3 QTc, BMET, Mg stable If still in AF on Friday, will need DCCV (scheduled for 1PM tomorrow)  2.  HTN Stable No change required today  3.  Prior tachycardia induced cardiomyopathy EF normal by echo 04/2016  AmbChanetta MarshallP 07/16/2016 8:34 AM   I have seen, examined the patient, and reviewed the above assessment and plan.  On exam, iRRR. Changes to above are made where necessary.  Will proceed with tikosyn loading.  I have spoken with Dr McLAundra Dubinout the patient and the challenges of maintaining sinus rhythm long term.  Compliance was discussed at length with the patient today as this has previously been a problem for her.  Co Sign: JamThompson GrayerD 07/16/2016 10:37 AM

## 2016-07-17 ENCOUNTER — Encounter (HOSPITAL_COMMUNITY): Payer: Self-pay | Admitting: *Deleted

## 2016-07-17 ENCOUNTER — Inpatient Hospital Stay (HOSPITAL_COMMUNITY): Payer: Medicare Other | Admitting: Anesthesiology

## 2016-07-17 ENCOUNTER — Ambulatory Visit (HOSPITAL_COMMUNITY): Admission: RE | Admit: 2016-07-17 | Payer: Medicare Other | Source: Ambulatory Visit | Admitting: Cardiovascular Disease

## 2016-07-17 ENCOUNTER — Encounter (HOSPITAL_COMMUNITY)
Admission: AD | Disposition: A | Discharge status: Home / Self Care | Payer: Self-pay | Source: Ambulatory Visit | Attending: Internal Medicine

## 2016-07-17 ENCOUNTER — Other Ambulatory Visit: Payer: Self-pay

## 2016-07-17 DIAGNOSIS — I4891 Unspecified atrial fibrillation: Secondary | ICD-10-CM

## 2016-07-17 HISTORY — PX: CARDIOVERSION: SHX1299

## 2016-07-17 LAB — MAGNESIUM: Magnesium: 1.8 mg/dL (ref 1.7–2.4)

## 2016-07-17 LAB — BASIC METABOLIC PANEL
Anion gap: 7 (ref 5–15)
BUN: 14 mg/dL (ref 6–20)
CO2: 26 mmol/L (ref 22–32)
Calcium: 9 mg/dL (ref 8.9–10.3)
Chloride: 106 mmol/L (ref 101–111)
Creatinine, Ser: 0.52 mg/dL (ref 0.44–1.00)
GFR calc Af Amer: >60 mL/min (ref >60)
GFR calc non Af Amer: >60 mL/min (ref >60)
Glucose, Bld: 101 mg/dL — ABNORMAL HIGH (ref 65–99)
Potassium: 4.3 mmol/L (ref 3.5–5.1)
Sodium: 139 mmol/L (ref 135–145)

## 2016-07-17 SURGERY — CARDIOVERSION
Anesthesia: Monitor Anesthesia Care

## 2016-07-17 MED ORDER — PROPOFOL 10 MG/ML IV BOLUS
INTRAVENOUS | Status: DC | PRN
  Administered 2016-07-17: 40 mg via INTRAVENOUS
  Administered 2016-07-17: 60 mg via INTRAVENOUS

## 2016-07-17 MED ORDER — LIDOCAINE 2% (20 MG/ML) 5 ML SYRINGE
INTRAMUSCULAR | Status: DC | PRN
  Administered 2016-07-17: 70 mg via INTRAVENOUS

## 2016-07-17 NOTE — H&P (View-Only) (Signed)
    SUBJECTIVE: The patient is doing well today.  At this time, she denies chest pain, shortness of breath, or any new concerns.  CURRENT MEDICATIONS: . apixaban  5 mg Oral BID  . diltiazem  240 mg Oral Daily  . dofetilide  250 mcg Oral BID  . losartan  50 mg Oral BID  . multivitamin with minerals  1 tablet Oral Daily  . sodium chloride flush  3 mL Intravenous Q12H  . vitamin C  500 mg Oral Daily      OBJECTIVE: Physical Exam: Vitals:   07/15/16 1559 07/15/16 1654 07/15/16 2024 07/16/16 0358  BP: 123/70  136/78 (!) 118/59  Pulse: 80  94 78  Resp: '18  18 18  '$ Temp: 97.7 F (36.5 C)  98.2 F (36.8 C) 98.3 F (36.8 C)  TempSrc: Oral  Oral Oral  SpO2: 98%  97% 98%  Weight:  148 lb (67.1 kg)    Height:  '5\' 2"'$  (1.575 m)      Intake/Output Summary (Last 24 hours) at 07/16/16 0831 Last data filed at 07/15/16 2026  Gross per 24 hour  Intake              480 ml  Output                0 ml  Net              480 ml    Telemetry reveals atrial fibrillation   GEN- The patient is well appearing, alert and oriented x 3 today.   Head- normocephalic, atraumatic Eyes-  Sclera clear, conjunctiva pink Ears- hearing intact Oropharynx- clear Neck- supple  Lungs- Clear to ausculation bilaterally, normal work of breathing Heart- Irregular rate and rhythm  GI- soft, NT, ND, + BS Extremities- no clubbing, cyanosis, or edema Skin- no rash or lesion Psych- euthymic mood, full affect Neuro- strength and sensation are intact  LABS: Basic Metabolic Panel:  Recent Labs  07/15/16 1103 07/16/16 0157  NA 138 138  K 4.3 4.0  CL 102 104  CO2 30 28  GLUCOSE 108* 109*  BUN 14 15  CREATININE 0.59 0.54  CALCIUM 9.6 9.4  MG 1.9 1.9    ASSESSMENT AND PLAN:  Active Problems:   Persistent atrial fibrillation (HCC)   1.  Persistent atrial fibrillation The patient has persistent atrial fibrillation with previous tachcyardia induced cardiomyopathy.  Dr Aundra Dubin has seen patient and feels  it is important to try to restore SR.  Dr Rayann Heman discussed at length with patient today rationale for being on 577mg of Tikosyn - she is now willing to take. Will give extra 2543m now and start 50038mtonight.  Continue Eliquis for CHADS2VASC of 3 QTc, BMET, Mg stable If still in AF on Friday, will need DCCV (scheduled for 1PM tomorrow)  2.  HTN Stable No change required today  3.  Prior tachycardia induced cardiomyopathy EF normal by echo 04/2016  AmbChanetta MarshallP 07/16/2016 8:34 AM   I have seen, examined the patient, and reviewed the above assessment and plan.  On exam, iRRR. Changes to above are made where necessary.  Will proceed with tikosyn loading.  I have spoken with Dr McLAundra Dubinout the patient and the challenges of maintaining sinus rhythm long term.  Compliance was discussed at length with the patient today as this has previously been a problem for her.  Co Sign: JamThompson GrayerD 07/16/2016 10:37 AM

## 2016-07-17 NOTE — Transfer of Care (Signed)
Immediate Anesthesia Transfer of Care Note  Patient: Maria Arnold  Procedure(s) Performed: Procedure(s): CARDIOVERSION (N/A)  Patient Location: Endoscopy Unit  Anesthesia Type:MAC  Level of Consciousness: awake and alert   Airway & Oxygen Therapy: Patient Spontanous Breathing  Post-op Assessment: Report given to RN and Post -op Vital signs reviewed and stable  Post vital signs: Reviewed and stable  Last Vitals:  Vitals:   07/17/16 0511 07/17/16 0937  BP: 129/74 (!) 190/118  Pulse: 91   Resp: 18 20  Temp: 36.6 C 36.4 C    Last Pain:  Vitals:   07/17/16 0937  TempSrc: Oral  PainSc: 0-No pain         Complications: No apparent anesthesia complications

## 2016-07-17 NOTE — Interval H&P Note (Signed)
History and Physical Interval Note:  07/17/2016 10:25 AM  Maria Arnold  has presented today for surgery, with the diagnosis of afib  The various methods of treatment have been discussed with the patient and family. After consideration of risks, benefits and other options for treatment, the patient has consented to  Procedure(s): CARDIOVERSION (N/A) as a surgical intervention .  The patient's history has been reviewed, patient examined, no change in status, stable for surgery.  I have reviewed the patient's chart and labs.  Questions were answered to the patient's satisfaction.     Mertie Moores

## 2016-07-17 NOTE — CV Procedure (Addendum)
    Cardioversion Note  Maria Arnold [id number] [birth year]  Procedure: DC Cardioversion Indications: atrial fib   Procedure Details Consent: Obtained Time Out: Verified patient identification, verified procedure, site/side was marked, verified correct patient position, special equipment/implants available, Radiology Safety Procedures followed,  medications/allergies/relevent history reviewed, required imaging and test results available.  Performed  The patient has been on adequate anticoagulation.  The patient received Lidocaine 70 mg followed by Propofol 60 mg IV  for sedation.  Synchronous cardioversion was performed at 120  joules.  The cardioversion was successful   She went back into atrial fib about 2-3 minutes later. She was rebolused with Proporol 40 mg IV ( total of 100 of propofol for the entire procedure)  Cardioversion was performed at 120 J.   Was successful   Complications: No apparent complications Patient did tolerate procedure well.   Thayer Headings, Brooke Bonito., MD, [facility] 07/17/2016, 10:34 AM

## 2016-07-17 NOTE — Anesthesia Preprocedure Evaluation (Addendum)
Anesthesia Evaluation  Patient identified by MRN, date of birth, ID band Patient awake    Reviewed: Allergy & Precautions, NPO status , Patient's Chart, lab work & pertinent test results  Airway Mallampati: II  TM Distance: >3 FB Neck ROM: full    Dental no notable dental hx. (+) Teeth Intact, Dental Advisory Given   Pulmonary asthma , former smoker,    Pulmonary exam normal        Cardiovascular hypertension, Normal cardiovascular exam+ dysrhythmias      Neuro/Psych    GI/Hepatic   Endo/Other    Renal/GU      Musculoskeletal   Abdominal   Peds  Hematology   Anesthesia Other Findings   Reproductive/Obstetrics                           Anesthesia Physical  Anesthesia Plan  ASA: III  Anesthesia Plan: MAC   Post-op Pain Management:    Induction: Intravenous  Airway Management Planned: Mask  Additional Equipment:   Intra-op Plan:   Post-operative Plan:   Informed Consent: I have reviewed the patients History and Physical, chart, labs and discussed the procedure including the risks, benefits and alternatives for the proposed anesthesia with the patient or authorized representative who has indicated his/her understanding and acceptance.     Plan Discussed with: CRNA, Anesthesiologist and Surgeon  Anesthesia Plan Comments:         Anesthesia Quick Evaluation

## 2016-07-17 NOTE — Care Management Note (Addendum)
Case Management Note Marvetta Gibbons RN, BSN Unit 2W-Case Manager [phone]  Patient Details  Name: Maria Arnold MRN: [id number] Date of Birth: 1946/01/14  Subjective/Objective:    Pt admitted with afib for Tikosyn load                Action/Plan: PTA pt lived at home- independent- plan to return home- insurance check completed- S/W DORIAN @ [facility] RX # [phone]   TIKOSYN  250 MCG BID (30 )  COVER- YES  CO-PAY- $ 25 % CO-INS  PRIOR APPROVAL- NO  [facility]Y: [facility]  $ 50.00 DEDUCTIBLE MET- YES  DOFETILIDE : SAME AS ABOVE  MAIL-ORDER MEDICINE FREE   **- spoke with pt and husband at bedside- per conversation pt is leaving for a week vacation this Sunday 9/10- she does her meds through Mail order with [facility]- pt will need 14 day supply from [facility] on discharge - please provide script with no refills for 14 day supply- pt will also need script for 90 day supply sent to [facility]- can send this electronically - (may also want to give pt a paper script for 30 days no refill to have on hand when she returns from her trip just in case she needs it to fill locally if her mail order has not arrived.)       Expected Discharge Date:     07/18/16             Expected Discharge Plan:  Home/Self Care  In-House Referral:     Discharge planning Services  CM Consult, Medication Assistance  Post Acute Care Choice:    Choice offered to:     DME Arranged:    DME Agency:     HH Arranged:    [place] Agency:     Status of Service:  Completed, signed off  If discussed at [employer], dates discussed:    Additional Comments:  Dawayne Patricia, RN 07/17/2016, 12:20 PM

## 2016-07-17 NOTE — Progress Notes (Signed)
    SUBJECTIVE: The patient is doing well today.  At this time, she denies chest pain, shortness of breath, or any new concerns.  CURRENT MEDICATIONS: . apixaban  5 mg Oral BID  . diltiazem  240 mg Oral Daily  . dofetilide  500 mcg Oral BID  . losartan  50 mg Oral BID  . multivitamin with minerals  1 tablet Oral Daily  . sodium chloride flush  3 mL Intravenous Q12H  . vitamin C  500 mg Oral Daily      OBJECTIVE: Physical Exam: Vitals:   07/16/16 1000 07/16/16 1327 07/16/16 2135 07/17/16 0511  BP: (!) 115/56 121/68 126/81 129/74  Pulse:  91 95 91  Resp:  '18 18 18  '$ Temp:  98 F (36.7 C) 98.1 F (36.7 C) 97.9 F (36.6 C)  TempSrc:  Oral Oral Oral  SpO2:  98% 98% 96%  Weight:      Height:        Intake/Output Summary (Last 24 hours) at 07/17/16 0818 Last data filed at 07/16/16 2221  Gross per 24 hour  Intake              960 ml  Output                0 ml  Net              960 ml    Telemetry reveals atrial fibrillation   GEN- The patient is well appearing, alert and oriented x 3 today.   Head- normocephalic, atraumatic Eyes-  Sclera clear, conjunctiva pink Ears- hearing intact Oropharynx- clear Neck- supple  Lungs- Clear to ausculation bilaterally, normal work of breathing Heart- Irregular rate and rhythm  GI- soft, NT, ND, + BS Extremities- no clubbing, cyanosis, or edema Skin- no rash or lesion Psych- euthymic mood, full affect Neuro- strength and sensation are intact  LABS: Basic Metabolic Panel:  Recent Labs  07/15/16 1103 07/16/16 0157  NA 138 138  K 4.3 4.0  CL 102 104  CO2 30 28  GLUCOSE 108* 109*  BUN 14 15  CREATININE 0.59 0.54  CALCIUM 9.6 9.4  MG 1.9 1.9    ASSESSMENT AND PLAN:  Active Problems:   Persistent atrial fibrillation (HCC)   1.  Persistent atrial fibrillation The patient has persistent atrial fibrillation with previous tachcyardia induced cardiomyopathy.  Dr Aundra Dubin has seen patient and feels it is important to try to  restore SR.  Continue Tikosyn 58mg twice daily Continue Eliquis for CHADS2VASC of 3 QTc satble, BMET, Mg pending this morning DCCV today - scheduled for 1PM    2.  HTN Stable No change required today  3.  Prior tachycardia induced cardiomyopathy EF normal by echo 04/2016  AChanetta Marshall NP 07/17/2016 8:18 AM   I have seen, examined the patient, and reviewed the above assessment and plan.  On exam, iRRR> Changes to above are made where necessary.  Will proceed with cardioversion today.  If QT is stable, will discharge to home tomorrow.  Will need close EP follow-up in the AF clinic.  Co Sign: JThompson Grayer MD 07/17/2016 10:44 AM

## 2016-07-17 NOTE — Discharge Summary (Addendum)
ELECTROPHYSIOLOGY PROCEDURE DISCHARGE SUMMARY    Patient ID: Maria Arnold,  MRN: [id number], DOB/AGE: [birth year] 70 y.o.  Admit date: 07/15/2016 Discharge date: 07/18/2016  Primary Care Physician: Vickii Penna., MD Primary Cardiologist: Aundra Dubin Electrophysiologist: Allred  Primary Discharge Diagnosis:  1.  Persistent atrial fibrillation status post Tikosyn loading this admission  Secondary Discharge Diagnosis:  1.  HTN 2.  Prior tachycardia induced cardiomyopathy  Allergies  Allergen Reactions  . Lisinopril Cough     Procedures This Admission:  1.  Tikosyn loading 2.  Direct current cardioversion on 07/17/16 by Dr Acie Fredrickson which successfully restored SR.  There were no early apparent complications.   Brief HPI: Maria Arnold is a 70 y.o. female with a past medical history as noted above.  She has undergone PVI x2 in the past with recurrent persistent atrial fibrillation.  She has been asymptomatic but with prior tachycardia induced cardiomyopathy, Dr Aundra Dubin felt it would be important to restore SR.  Risks, benefits, and alternatives to Tikosyn were reviewed with the patient who wished to proceed.    Hospital Course:  The patient was admitted and Tikosyn was initiated.  Renal function and electrolytes were followed during the hospitalization.  Their QTc remained stable.  On 07/17/16 they underwent direct current cardioversion which restored sinus rhythm.  They were monitored until discharge on telemetry which demonstrated NSR.  On the day of discharge, they were examined by Dr Lovena Le who considered them stable for discharge to home.  Follow-up has been arranged with AF clinic in 1 and 4 weeks.   The importance of compliance with follow up was discussed extensively with the patient this admission. She was previously taken off Tikosyn because of poor compliance.  She is aware that without keeping scheduled follow-up, we will not fill Tikosyn.  She has been given a prescription for 1  month supply at a time at discharge.   Physical Exam: Vitals:   07/17/16 1352 07/17/16 2015 07/18/16 0638 07/18/16 0920  BP: 136/63 (!) 131/55 (!) 142/64 117/76  Pulse: 85 88 81 95  Resp: '20 16  18  '$ Temp: 98.5 F (36.9 C) 98.3 F (36.8 C) 98.6 F (37 C) 98.6 F (37 C)  TempSrc: Oral Oral Oral Oral  SpO2: 97% 98% 99% 97%  Weight:      Height:        GEN- The patient is well appearing, alert and oriented x 3 today.   HEENT: normocephalic, atraumatic; sclera clear, conjunctiva pink; hearing intact; oropharynx clear; neck supple, no JVP Lymph- no cervical lymphadenopathy Lungs- Clear to ausculation bilaterally, normal work of breathing.  No wheezes, rales, rhonchi Heart- Regular rate and rhythm, no murmurs, rubs or gallops, PMI not laterally displaced GI- soft, non-tender, non-distended, bowel sounds present, no hepatosplenomegaly Extremities- no clubbing, cyanosis, or edema; DP/PT/radial pulses 2+ bilaterally MS- no significant deformity or atrophy Skin- warm and dry, no rash or lesion Psych- euthymic mood, full affect Neuro- strength and sensation are intact   Labs:   Lab Results  Component Value Date   WBC 7.4 04/15/2016   HGB 14.7 04/15/2016   HCT 42.7 04/15/2016   MCV 101.2 (H) 04/15/2016   PLT 299 04/15/2016     Recent Labs Lab 07/18/16 0328  NA 139  K 4.8  CL 104  CO2 29  BUN 16  CREATININE 0.56  CALCIUM 9.4  GLUCOSE 106*     Discharge Medications:    Medication List    STOP taking these  medications   diltiazem 240 MG 24 hr capsule Commonly known as:  DILACOR XR     TAKE these medications   apixaban 5 MG Tabs tablet Commonly known as:  ELIQUIS Take 1 tablet (5 mg total) by mouth 2 (two) times daily.   Calcium-Magnesium-Vitamin D 185-50-100 MG-MG-UNIT Caps Take 1 tablet by mouth daily.   dofetilide 500 MCG capsule Commonly known as:  TIKOSYN Take 1 capsule (500 mcg total) by mouth 2 (two) times daily.   Iodine-Vitamin A 0.650-701-1864  MG-UNIT Caps Take 1 tablet by mouth daily.   losartan 50 MG tablet Commonly known as:  COZAAR Take 1 tablet (50 mg total) by mouth 2 (two) times daily.   Magnesium 100 MG Caps Take 100 mg by mouth daily.   multivitamin with minerals Tabs tablet Take 1 tablet by mouth daily.   neomycin-polymyxin b-dexamethasone 3.5-10000-0.1 Oint Commonly known as:  MAXITROL PLACE 1 APPLICATION INTO ONE EYE NIGHTLY (LEFT EYE)   VITAMIN B-12 CR PO Take 3 tablets by mouth daily.   vitamin C 500 MG tablet Commonly known as:  ASCORBIC ACID Take 500 mg by mouth daily.       Disposition:   Follow-up Information    [facility] Follow up on 07/28/2016.   Specialty:  Cardiology Why:  at 1:30PM Contact information: [address] [id number] [place] [phone]          Duration of Discharge Encounter: Greater than 30 minutes including physician time.  Signed, Bernerd Pho, PA-C 07/18/2016 9:47 AM  EP Attending Patient seen and examined. Agree with above. She appears stable for dc. Ok for dc if QT ok.  Ponciano Ort.  EP Attending  I have reviewed the ECG after tikosyn. Her QTC is around 470. She is stable for dc. She will need to return in 5-7 days if possible for an ECG and BMP.  Mikle Bosworth.D.

## 2016-07-18 ENCOUNTER — Other Ambulatory Visit: Payer: Self-pay

## 2016-07-18 LAB — BASIC METABOLIC PANEL
Anion gap: 6 (ref 5–15)
BUN: 16 mg/dL (ref 6–20)
CO2: 29 mmol/L (ref 22–32)
Calcium: 9.4 mg/dL (ref 8.9–10.3)
Chloride: 104 mmol/L (ref 101–111)
Creatinine, Ser: 0.56 mg/dL (ref 0.44–1.00)
GFR calc Af Amer: >60 mL/min (ref >60)
GFR calc non Af Amer: >60 mL/min (ref >60)
Glucose, Bld: 106 mg/dL — ABNORMAL HIGH (ref 65–99)
Potassium: 4.8 mmol/L (ref 3.5–5.1)
Sodium: 139 mmol/L (ref 135–145)

## 2016-07-18 LAB — MAGNESIUM: Magnesium: 2 mg/dL (ref 1.7–2.4)

## 2016-07-18 MED ORDER — DOFETILIDE 500 MCG PO CAPS: 500.0000 ug | ORAL_CAPSULE | Freq: Two times a day (BID) | ORAL | 3 refills | Status: DC

## 2016-07-18 MED ORDER — DOFETILIDE 500 MCG PO CAPS
500.0000 ug | ORAL_CAPSULE | Freq: Two times a day (BID) | ORAL | 0 refills | Status: DC
Start: 2016-07-18 — End: 2016-07-18

## 2016-07-18 MED ORDER — DOFETILIDE 500 MCG PO CAPS: 500.0000 ug | ORAL_CAPSULE | Freq: Two times a day (BID) | ORAL | 0 refills | Status: DC

## 2016-07-18 MED ORDER — HYDROCORTISONE 0.5 % EX CREA
TOPICAL_CREAM | CUTANEOUS | Status: DC | PRN
  Filled 2016-07-18: qty 28.35

## 2016-07-18 NOTE — Progress Notes (Addendum)
Patient in a stable condition, this RN went over discharge teachings with patient she verbalised understanding, patient belongings at bedside, paper prescription given to patient , 14 day supply of tikosyn given to patient, tele dc ccmd notiofied, iv removed, patient taken off the unit on a wheelchair by this RN

## 2016-07-20 NOTE — Anesthesia Postprocedure Evaluation (Signed)
Anesthesia Post Note  Patient: Maria Arnold  Procedure(s) Performed: Procedure(s) (LRB): CARDIOVERSION (N/A)  Patient location during evaluation: PACU Anesthesia Type: MAC Level of consciousness: awake and alert Pain management: pain level controlled Vital Signs Assessment: post-procedure vital signs reviewed and stable Respiratory status: spontaneous breathing, nonlabored ventilation, respiratory function stable and patient connected to nasal cannula oxygen Cardiovascular status: stable and blood pressure returned to baseline Anesthetic complications: no     Last Vitals:  Vitals:   07/18/16 0638 07/18/16 0920  BP: (!) 142/64 117/76  Pulse: 81 95  Resp:  18  Temp: 37 C 37 C    Last Pain:  Vitals:   07/18/16 0920  TempSrc: Oral  PainSc:    Pain Goal:                 [name]

## 2016-07-27 ENCOUNTER — Telehealth (HOSPITAL_COMMUNITY): Payer: Self-pay | Admitting: Vascular Surgery

## 2016-07-27 ENCOUNTER — Encounter (HOSPITAL_COMMUNITY): Payer: Medicare Other | Admitting: Nurse Practitioner

## 2016-07-27 NOTE — Telephone Encounter (Signed)
Left pt message to make NP f/u W/ Mclean

## 2016-07-28 ENCOUNTER — Ambulatory Visit (HOSPITAL_COMMUNITY): Payer: Medicare Other | Admitting: Nurse Practitioner

## 2016-07-29 ENCOUNTER — Inpatient Hospital Stay (HOSPITAL_COMMUNITY): Admission: RE | Admit: 2016-07-29 | Payer: Medicare Other | Source: Ambulatory Visit | Admitting: Nurse Practitioner

## 2016-07-30 ENCOUNTER — Ambulatory Visit (HOSPITAL_COMMUNITY): Payer: Medicare Other | Admitting: Nurse Practitioner

## 2016-07-30 ENCOUNTER — Encounter (HOSPITAL_COMMUNITY): Payer: Self-pay | Admitting: Nurse Practitioner

## 2016-07-30 ENCOUNTER — Ambulatory Visit (HOSPITAL_COMMUNITY)
Admission: RE | Admit: 2016-07-30 | Discharge: 2016-07-30 | Disposition: A | Discharge status: Home / Self Care | Payer: Medicare Other | Source: Ambulatory Visit | Attending: Nurse Practitioner | Admitting: Nurse Practitioner

## 2016-07-30 VITALS — BP 186/80 | HR 87 | Ht 62.0 in | Wt 154.0 lb

## 2016-07-30 DIAGNOSIS — J45909 Unspecified asthma, uncomplicated: Secondary | ICD-10-CM | POA: Diagnosis not present

## 2016-07-30 DIAGNOSIS — I480 Paroxysmal atrial fibrillation: Secondary | ICD-10-CM | POA: Insufficient documentation

## 2016-07-30 DIAGNOSIS — Z9889 Other specified postprocedural states: Secondary | ICD-10-CM | POA: Insufficient documentation

## 2016-07-30 DIAGNOSIS — Z7901 Long term (current) use of anticoagulants: Secondary | ICD-10-CM | POA: Insufficient documentation

## 2016-07-30 DIAGNOSIS — I4819 Other persistent atrial fibrillation: Secondary | ICD-10-CM

## 2016-07-30 DIAGNOSIS — Z87891 Personal history of nicotine dependence: Secondary | ICD-10-CM | POA: Diagnosis not present

## 2016-07-30 DIAGNOSIS — I481 Persistent atrial fibrillation: Secondary | ICD-10-CM | POA: Insufficient documentation

## 2016-07-30 DIAGNOSIS — Z79899 Other long term (current) drug therapy: Secondary | ICD-10-CM | POA: Insufficient documentation

## 2016-07-30 DIAGNOSIS — I10 Essential (primary) hypertension: Secondary | ICD-10-CM | POA: Diagnosis not present

## 2016-07-30 LAB — BASIC METABOLIC PANEL
Anion gap: 11 (ref 5–15)
BUN: 13 mg/dL (ref 6–20)
CO2: 28 mmol/L (ref 22–32)
Calcium: 9.6 mg/dL (ref 8.9–10.3)
Chloride: 100 mmol/L — ABNORMAL LOW (ref 101–111)
Creatinine, Ser: 0.5 mg/dL (ref 0.44–1.00)
GFR calc Af Amer: >60 mL/min (ref >60)
GFR calc non Af Amer: >60 mL/min (ref >60)
Glucose, Bld: 113 mg/dL — ABNORMAL HIGH (ref 65–99)
Potassium: 5.1 mmol/L (ref 3.5–5.1)
Sodium: 139 mmol/L (ref 135–145)

## 2016-07-30 LAB — MAGNESIUM: Magnesium: 2 mg/dL (ref 1.7–2.4)

## 2016-07-30 NOTE — Progress Notes (Signed)
Patient ID: Maria Arnold, female   DOB: 16-Jul-1946, 70 y.o.   MRN: [id number]     Primary Care Physician: Vickii Penna., MD Referring Physician: Dr. Rayann Heman Cardiologist: Dr. Thea Silversmith JENNIFFER Arnold is a 70 y.o. female with a h/o afib that had her second ablation 4/16. She had been on tikosyn prior to ablation. She was seen in f/u by Dr. Rayann Heman in March 2017 and he was concerned with her noncompliance with no  f/u from ablation and no one had been following her tikosyn due to several cancellation of appointments. She had been in SR since the ablation. He stopped tikosyn for lack of afib and safety concerns. She did go back to Afib 1-2 months later.   She is now in afib with rvr.  States at home that v rates are in the 80's.She was seen recently by Eugenia Mcalpine, PA and had afib with RVR, she was given option of staying on cardizem or starting verapamil and she chose to stay on cardizem. 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 Her cardiologist, Dr. Aundra Dubin wants her to get back in SR and  has discussed restarting tikosyn. The pt is going to the beach next week and cannot do anything at this time but is willing to return in a couple of weeks to discuss going into the hospital for [place]. She is also interested in an third ablation but maintained SR very well on tikosyn and that might be her best option.  She returns to afib clinic 7/13. She states that she had an uneventful beach trip as far as her heart was concerned. HR stayed less than 90 and BP in the 130's. HR and BP up today but she is very anxious. BP rechecked at 138/78.She states that her son is coming in off leave and she will be going back to the beach next week. She wants to delay Tikosyn hospitalization until week of 7/24. Echo form 6/30 showed normal LV systolic function, mild LAE; mild TR.  Pt returns to [place] clinic,  9/21, after hospitalization for tikosyn reload. Discharged in [place] after successful cardioversion on tikosyn 500 mg bid. She then left for a trip to [place] and had a few hours of afib there, but has been in SR on return home. BP initially elevated,  said she was anxious today, but rechecked 150/90 before leaving clinic.  Today, she denies symptoms of palpitations, chest pain, shortness of breath, orthopnea, PND, lower extremity edema, dizziness, presyncope, syncope, or neurologic sequela. The patient is tolerating medications without difficulties and is otherwise without complaint today.   Past Medical History:  Diagnosis Date  . Asthma   . Beta-blocker intolerance   . Foot pain    From bone spur  . History of narrow angle glaucoma   . HTN (hypertension)   . NICM (nonischemic cardiomyopathy) ([place])    a. EF 30-35% in 2012 -> LHC (2/12): No coronary disease, Cardiomyopathy felt either tachycardia-mediated or Takotsubo (emotional shock the week before hospitalization). b. f/u echo 05/2011 with EF 55-60%, normal RV, normal valves.   . Paroxysmal atrial fibrillation (HCC)    a. AF ablation x 2. b. Previously on Tikosyn.   Past Surgical History:  Procedure Laterality Date  . ATRIAL FIBRILLATION ABLATION N/A 09/22/2012   Procedure: ATRIAL FIBRILLATION ABLATION;  Surgeon: [name], MD;  Location: [facility] CATH LAB;  Service: Cardiovascular;  Laterality: N/A;  . ATRIAL FIBRILLATION ABLATION  02/12/2015  . ATRIAL FIBRILLATION ABLATION N/A 02/12/2015   Procedure: ATRIAL FIBRILLATION ABLATION;  Surgeon: [name], MD;  Location: [facility] CATH LAB;  Service: Cardiovascular;  Laterality: N/A;  . CARDIAC CATHETERIZATION  12/2010  . CARDIOVERSION N/A 07/17/2016   Procedure: CARDIOVERSION;  Surgeon: Thayer Headings, MD;  Location[facility] ENDOSCOPY;  Service: Cardiovascular;  Laterality: N/A;  . DILATION AND CURETTAGE OF UTERUS    . LAPAROSCOPIC ABDOMINAL EXPLORATION  1980's  . NASAL SINUS SURGERY  1980's  . TEE WITHOUT  CARDIOVERSION  09/21/2012   Procedure: TRANSESOPHAGEAL ECHOCARDIOGRAM (TEE);  Surgeon: Thayer Headings, MD;  Location: [place];  Service: Cardiovascular;  Laterality: N/A;  . TEE WITHOUT CARDIOVERSION N/A 02/12/2015   Procedure: TRANSESOPHAGEAL ECHOCARDIOGRAM (TEE);  Surgeon: Josue Hector, MD;  Location: [place];  Service: Cardiovascular;  Laterality: N/A;  . TONSILLECTOMY AND ADENOIDECTOMY  1953    Current Outpatient Prescriptions  Medication Sig Dispense Refill  . apixaban (ELIQUIS) 5 MG TABS tablet Take 1 tablet (5 mg total) by mouth 2 (two) times daily. 10 tablet 0  . Ascorbic Acid (VITAMIN C) 500 MG tablet Take 500 mg by mouth daily.      . Calcium-Magnesium-Vitamin D 185-50-100 MG-MG-UNIT CAPS Take 1 tablet by mouth daily.    . Cyanocobalamin (VITAMIN B-12 CR PO) Take 3 tablets by mouth daily.     Marland Kitchen dofetilide (TIKOSYN) 500 MCG capsule Take 1 capsule (500 mcg total) by mouth 2 (two) times daily. 60 capsule 0  . Iodine-Vitamin A [phone] MG-UNIT CAPS Take 1 tablet by mouth daily.    Marland Kitchen losartan (COZAAR) 50 MG tablet Take 1 tablet (50 mg total) by mouth 2 (two) times daily. 180 tablet 3  . Magnesium 100 MG CAPS Take 100 mg by mouth daily.    . Multiple Vitamin (MULTIVITAMIN WITH MINERALS) TABS Take 1 tablet by mouth daily.    Marland Kitchen neomycin-polymyxin b-dexamethasone (MAXITROL) 3.5-10000-0.1 OINT PLACE 1 APPLICATION INTO ONE EYE NIGHTLY (LEFT EYE)  3   No current facility-administered medications for this encounter.     Allergies  Allergen Reactions  . Lisinopril Cough    Social History   Social History  . Marital status: Married    Spouse name: N/A  . Number of children: 2  . Years of education: N/A   Occupational History  .  Other   Social History Main Topics  . Smoking status: Former Smoker    Years: 11.00    Types: Cigarettes  . Smokeless tobacco: Never Used     Comment: "quit smoking in 1976"  . Alcohol use 4.2 oz/week    7 Glasses of wine per week  .  Drug use: No  . Sexual activity: Yes   Other Topics Concern  . Not on file   Social History Narrative   Lives in [place] with husband.  2 children, 5 grandchildren.  Nonsmoker, occasional ETOH.     Family History  Problem Relation Age of Onset  . Prostate cancer Father   . Skin cancer Father   . Emphysema Mother   . Coronary artery disease Neg Hx     Premature    ROS- All systems are reviewed and negative except as per the HPI above  Physical Exam: Vitals:   07/30/16 1540  BP: (!) 186/80  Pulse: 87  Weight: 154 lb (69.9 kg)  Height: '5\' 2"'$  (1.575 m)    GEN- The patient is well  appearing, alert and oriented x 3 today.   Head- normocephalic, atraumatic Eyes-  Sclera clear, conjunctiva pink Ears- hearing intact Oropharynx- clear Neck- supple, no JVP Lymph- no cervical lymphadenopathy Lungs- Clear to ausculation bilaterally, normal work of breathing Heart- regular rate and rhythm, no murmurs, rubs or gallops, PMI not laterally displaced GI- soft, NT, ND, + BS Extremities- no clubbing, cyanosis, or edema MS- no significant deformity or atrophy Skin- no rash or lesion Psych- euthymic mood, full affect Neuro- strength and sensation are intact  EKG-SR at 87 bpm, septal infarct, pr int 176 ms, qrs int 84 ms, qtc 476 ms Epic records reviewed  Assessment and Plan: 1. Persistent afib Now in SR with tikosyn reload Pt was minimally symptomatic in afib but has had TMC in past Most recent echo with normal EF Continue with tikosyn Mag/bmet STRESSED to pt she has to have very close f/u now back on tikosyn and cannot cancel appointments Continue with apixaban 5 mg bid  2. HTN  Continue losartan Initially elevated but improved on recheck at 150/90  F/u 10/13 afib clinic  [place]. Drew Herman, [facility] [address] [place], [place] [address] [phone]

## 2016-08-04 ENCOUNTER — Other Ambulatory Visit: Payer: Self-pay | Admitting: *Deleted

## 2016-08-04 ENCOUNTER — Telehealth: Payer: Self-pay | Admitting: *Deleted

## 2016-08-04 MED ORDER — DOFETILIDE 500 MCG PO CAPS: 500.0000 ug | ORAL_CAPSULE | Freq: Two times a day (BID) | ORAL | 11 refills | Status: DC

## 2016-08-04 NOTE — Telephone Encounter (Signed)
Patient called and requested a refill on tikosyn be sent to [facility] as they are the only pharmacy that she found that has it in stock. She stated that she is supposed to be receiving it in the mail from [employer], meds by mail but she has already took her last pill and was supposed to take a dose at 8:30 this morning. She insisted that I speak with her husband as he is extremely upset that he has been calling and has been told that his wife was not found in our system. Turns out that [facility]s by mail contacted the hospital and spoke with an RN who stated that she was unable to locate the patient in the system. They were trying to get an approval from [place] as the tikosyn interacts with another medication that the patient is taking. Patients husband was upset that it took so long to get the approval and now he is going to have to pay out of pocket for a local rx. He asked me if the office was going to pay for the prescription and then went on to say that I cannot believe that I did not even hear an I'm sorry from you. I apologized and was trying to explain to him that I was not sure why no one contacted the office as we would have been able to take care of this but he proceeded to hang up on me.

## 2016-08-04 NOTE — Telephone Encounter (Signed)
Talked with pts husband in regards to issue with tikosyn not arriving by mail. Offered samples for 1 week to be picked up this morning at the [address] location but husband was at [employer] a 15 day supply because pt is going out of town shortly and needed it prior to leaving. Husband was very upset with the delay in mail order receiving the information needed to fill the prescription. I confirmed with husband that mail order had received information needed to fill prescription yesterday and would be mailing medications in 7-10 days. Of note patient did not mention any issues with running low on tikosyn at follow up hospital visit on 9/21 when asked.  Informed husband any further issues with tikosyn to give several days notice before running out of medication. This way the office will have time to correct and help as able to avoid an issue like this again. Pt husband was appreciative of the call and assistance.

## 2016-08-17 ENCOUNTER — Telehealth (HOSPITAL_COMMUNITY): Payer: Self-pay | Admitting: Vascular Surgery

## 2016-08-17 NOTE — Telephone Encounter (Signed)
Called pt several time , spoke to her once she was leaving out the door she asked for a call back , called her no answer, I scheduled pt sent letter for time and date

## 2016-08-18 ENCOUNTER — Inpatient Hospital Stay (HOSPITAL_COMMUNITY): Admission: RE | Admit: 2016-08-18 | Payer: Medicare Other | Source: Ambulatory Visit | Admitting: Nurse Practitioner

## 2016-08-26 ENCOUNTER — Ambulatory Visit (HOSPITAL_COMMUNITY): Payer: Medicare Other | Admitting: Nurse Practitioner

## 2016-08-28 ENCOUNTER — Telehealth (HOSPITAL_COMMUNITY): Payer: Self-pay | Admitting: *Deleted

## 2016-08-28 NOTE — Telephone Encounter (Signed)
[place]

## 2016-08-28 NOTE — Telephone Encounter (Signed)
Pt cld today because of a problem getting her tikosyn.  Pt stated that [facility] had sent in another Rx but that she still received a letter in the mail that this could not be filled due to a problem with the Rx.  The letter stated that they had not recvd response from the provider.  I called [facility] and found that they had been trying to contact the hospitalist about a drug to drug interaction with verapamil.  The pt has not been on verapamil since 04/2016 when it was d/c'd by Melina Copa, PA. I verified this with pharmacist at [facility] and she stated she would release the Rx and it would mail out Tuesday.  I cld [address] office and they will put a sample bottle of tikosyn (14 caps) a the front desk for pickup.  Pt is aware of progress and will pick them up Tuesday

## 2016-09-02 ENCOUNTER — Inpatient Hospital Stay (HOSPITAL_COMMUNITY): Admission: RE | Admit: 2016-09-02 | Payer: Medicare Other | Source: Ambulatory Visit | Admitting: Nurse Practitioner

## 2016-09-04 ENCOUNTER — Inpatient Hospital Stay (HOSPITAL_COMMUNITY): Admission: RE | Admit: 2016-09-04 | Payer: Medicare Other | Source: Ambulatory Visit | Admitting: Nurse Practitioner

## 2016-09-07 ENCOUNTER — Encounter (HOSPITAL_COMMUNITY): Payer: Medicare Other

## 2016-09-25 ENCOUNTER — Encounter (HOSPITAL_COMMUNITY): Payer: Medicare Other

## 2016-09-28 ENCOUNTER — Other Ambulatory Visit: Payer: Self-pay | Admitting: Cardiology

## 2016-09-28 MED ORDER — APIXABAN 5 MG PO TABS: 5.0000 mg | ORAL_TABLET | Freq: Two times a day (BID) | ORAL | 0 refills | Status: DC

## 2016-09-28 MED ORDER — APIXABAN 5 MG PO TABS: 5.0000 mg | ORAL_TABLET | Freq: Two times a day (BID) | ORAL | 2 refills | Status: DC

## 2016-10-09 ENCOUNTER — Other Ambulatory Visit: Payer: Self-pay | Admitting: Cardiology

## 2016-10-09 MED ORDER — APIXABAN 5 MG PO TABS: 5.0000 mg | ORAL_TABLET | Freq: Two times a day (BID) | ORAL | 0 refills | Status: DC

## 2016-10-09 NOTE — Telephone Encounter (Signed)
Pt calling stating that she has not gotten her Eliquis from the [place]. I called the [facility] and they stated that it has not been mailed yet, but is in the process of being mailed today and that the pt would receive it in 7-10 days. I sent to a 10 day supply for the pt until mail order comes, to her local pharmacy. I advised the pt that is she has any other problems, questions or concerns to call the office. Pt verbalized understanding.

## 2016-10-27 ENCOUNTER — Encounter (HOSPITAL_COMMUNITY): Payer: Medicare Other

## 2016-10-29 ENCOUNTER — Telehealth: Payer: Self-pay | Admitting: *Deleted

## 2016-10-29 MED ORDER — DOFETILIDE 500 MCG PO CAPS: 500.0000 ug | ORAL_CAPSULE | Freq: Two times a day (BID) | ORAL | 2 refills | Status: DC

## 2016-10-29 MED ORDER — APIXABAN 5 MG PO TABS: 5.0000 mg | ORAL_TABLET | Freq: Two times a day (BID) | ORAL | 2 refills | Status: DC

## 2016-10-29 NOTE — Telephone Encounter (Signed)
called regarding the pt's call about her Losartan, Eliquis & Tikosyn, she has refills aty [facility] for Losartan is there, resent Eliquis and Tikosyn, pt aware.

## 2016-11-23 ENCOUNTER — Encounter (HOSPITAL_COMMUNITY): Payer: Medicare Other

## 2016-11-24 ENCOUNTER — Encounter (HOSPITAL_COMMUNITY): Payer: Medicare Other

## 2016-12-28 ENCOUNTER — Ambulatory Visit (HOSPITAL_COMMUNITY)
Admission: RE | Admit: 2016-12-28 | Discharge: 2016-12-28 | Disposition: A | Discharge status: Home / Self Care | Payer: Medicare Other | Source: Ambulatory Visit | Attending: Cardiology | Admitting: Cardiology

## 2016-12-28 VITALS — BP 136/64 | HR 77 | Wt 152.5 lb

## 2016-12-28 DIAGNOSIS — I5022 Chronic systolic (congestive) heart failure: Secondary | ICD-10-CM | POA: Insufficient documentation

## 2016-12-28 DIAGNOSIS — I509 Heart failure, unspecified: Secondary | ICD-10-CM | POA: Diagnosis not present

## 2016-12-28 DIAGNOSIS — I5181 Takotsubo syndrome: Secondary | ICD-10-CM | POA: Diagnosis present

## 2016-12-28 DIAGNOSIS — I110 Hypertensive heart disease with heart failure: Secondary | ICD-10-CM | POA: Diagnosis not present

## 2016-12-28 DIAGNOSIS — I4891 Unspecified atrial fibrillation: Secondary | ICD-10-CM | POA: Diagnosis not present

## 2016-12-28 DIAGNOSIS — I480 Paroxysmal atrial fibrillation: Secondary | ICD-10-CM | POA: Diagnosis not present

## 2016-12-28 DIAGNOSIS — Z7901 Long term (current) use of anticoagulants: Secondary | ICD-10-CM | POA: Diagnosis not present

## 2016-12-28 DIAGNOSIS — J45909 Unspecified asthma, uncomplicated: Secondary | ICD-10-CM | POA: Diagnosis not present

## 2016-12-28 DIAGNOSIS — I428 Other cardiomyopathies: Secondary | ICD-10-CM | POA: Diagnosis not present

## 2016-12-28 DIAGNOSIS — Z79899 Other long term (current) drug therapy: Secondary | ICD-10-CM | POA: Diagnosis not present

## 2016-12-28 LAB — LIPID PANEL
Cholesterol: 189 mg/dL (ref 0–200)
HDL: 72 mg/dL (ref >40)
LDL Cholesterol: 101 mg/dL — ABNORMAL HIGH (ref 0–99)
Total CHOL/HDL Ratio: 2.6 RATIO
Triglycerides: 79 mg/dL (ref <150)
VLDL: 16 mg/dL (ref 0–40)

## 2016-12-28 LAB — CBC
HCT: 39 % (ref 36.0–46.0)
Hemoglobin: 13.2 g/dL (ref 12.0–15.0)
MCH: 35.2 pg — ABNORMAL HIGH (ref 26.0–34.0)
MCHC: 33.8 g/dL (ref 30.0–36.0)
MCV: 104 fL — ABNORMAL HIGH (ref 78.0–100.0)
Platelets: 246 10*3/uL (ref 150–400)
RBC: 3.75 MIL/uL — ABNORMAL LOW (ref 3.87–5.11)
RDW: 12.7 % (ref 11.5–15.5)
WBC: 6.5 10*3/uL (ref 4.0–10.5)

## 2016-12-28 LAB — BASIC METABOLIC PANEL
Anion gap: 9 (ref 5–15)
BUN: 12 mg/dL (ref 6–20)
CO2: 28 mmol/L (ref 22–32)
Calcium: 9.6 mg/dL (ref 8.9–10.3)
Chloride: 103 mmol/L (ref 101–111)
Creatinine, Ser: 0.49 mg/dL (ref 0.44–1.00)
GFR calc Af Amer: >60 mL/min (ref >60)
GFR calc non Af Amer: >60 mL/min (ref >60)
Glucose, Bld: 114 mg/dL — ABNORMAL HIGH (ref 65–99)
Potassium: 4.6 mmol/L (ref 3.5–5.1)
Sodium: 140 mmol/L (ref 135–145)

## 2016-12-28 LAB — MAGNESIUM: Magnesium: 1.8 mg/dL (ref 1.7–2.4)

## 2016-12-28 NOTE — Patient Instructions (Signed)
Labs today  Your physician recommends that you schedule a follow-up appointment in: 3 months  

## 2016-12-29 NOTE — Progress Notes (Signed)
Patient ID: Maria Arnold, female   DOB: July 03, 1946, 71 y.o.   MRN: [id number] PCP: Dr Tammi Klippel Cardiology: Dr. Aundra Dubin  71 yo with hospital admission in 2/12 for atrial fibrillation and CHF presents for followup.  For about a week prior to admission, she had had congestion and shortness of breath. She thought she had a bad cold. She was seen by her PCP and ECG showed atrial fibrillation with RVR. She was admitted to [facility] for evaluation. CXR did not show PNA. She had wheezing consistent with a prior asthma diagnosis. She was then noted by echo to have EF 30-35% with peri-apical severe hypokinesis. LHC was done, there was no angiographic coronary disease. 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 She had suffered an emotional shock about a week prior to admission when her daughter-in-law had a miscarriage.   She underwent TEE-guided cardioversion, but this failed to convert her to NSR. She was then started on Tikosyn, which did convert her to NSR but her QTc prolonged in the setting of using moxifloxacin for acute bronchitis treatment. Moxifloxacin was stopped and the dofetilide dose was cut back to 250 micrograms two times a day. At this dose, QTc was normal.  Repeat echo done in 7/12 showed EF improved to 55-60% with normal RV and valves.  She underwent atrial fibrillation ablation in 11/13.  In 4/14, we talked about stopping Tikosyn and she got very nervous.  She actually went into atrial fibrillation in the office so I did not stop Tikosyn.   She had a re-do atrial fibrillation ablation in 4/16.   She saw Dr Rayann Heman in 3/17 and stopped Tikosyn and bisoprolol.  Since stopping bisoprolol, she has felt much better, no further wheezing or dyspnea.  She went back into atrial fibrillation and was reloaded on Tikosyn and cardioverted in 9/17.   Today, she is in NSR.  She feels like she has occasional runs of atrial fibrillation but is primarily in NSR.   She is under a lot of stress, husband has metastatic prostate cancer.  SBP running in the 130s generally at home when she checks.  No exertional dyspnea.  No chest pain.  No orthopnea/PND.  No lightheadedness.   ECG: NSR, normal.  QTc 450 msec.    Labs (2/12): K 4.8, creatinine 0.67  Labs (3/12): K 4.2, creatinine 0.5  Labs (11/13): K 3.8, creatinine 0.5 Labs (4/14): K 4.1, creatinine 0.7, HCT 44.7 Labs (12/14): K 4.1, creatinine 0.6, HCT 42, TSH normal, Mg 2.1 Labs (8/15): K 4.2, creatinine 0.5, HCT 41, thyroid indices normal Labs (4/16): K 3.8, creatinine 0.46, Mg 1.8 Labs (9/17): K 5.1, creatinine 0.5, Mg 2  Allergies (verified):  No Known Drug Allergies   Past Medical History:  1. Paroxysmal atrial fibrillation with rapid ventricular response. She is on coumadin and dofetilide. She has had some breakthrough atrial fib on dofetilide. Atrial fibrillation ablation in 11/13 and redo in 4/16. Dofetilide stopped 3/17, had breakthrough atrial fibrillation after this. Reloaded with Tikosyn in 9/17 and cardioverted to NSR.  2. Cardiomyopathy: Nonischemic. LHC (2/12): No coronary disease, EF 30-35% with periapical severe hypokinesis. Echo (2/12): EF 30-35%, periapical severe hypokinesis, normal RV, mild to moderate MR. 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  Repeat echo in 7/12 showed EF 55-60%, mild biatrial enlargement, normal RV, normal valves.  - TEE (4/16) with EF 55-60%. - Echo (6/17) with EF 55-60%.  3. Asthma:  Beta blocker intolerance.  4. HTN  5. Foot pain from bone spur  6. Low back pain  7. Narrow angle glaucoma 8. Nasal bone fracture s/p ENT surgery   Family History:  No premature CAD   Social History:  Lives in [place] with husband. 2 children, 4 grandchildren. Nonsmoker, occasional ETOH.   Review of Systems  All systems reviewed and negative except as per HPI.   Current Outpatient  Prescriptions  Medication Sig Dispense Refill  . apixaban (ELIQUIS) 5 MG TABS tablet Take 1 tablet (5 mg total) by mouth 2 (two) times daily. 180 tablet 2  . Ascorbic Acid (VITAMIN C) 500 MG tablet Take 500 mg by mouth daily.      . Calcium-Magnesium-Vitamin D 185-50-100 MG-MG-UNIT CAPS Take 1 tablet by mouth daily.    . Cyanocobalamin (VITAMIN B-12 CR PO) Take 3 tablets by mouth daily.     Marland Kitchen dofetilide (TIKOSYN) 500 MCG capsule Take 1 capsule (500 mcg total) by mouth 2 (two) times daily. 180 capsule 2  . Iodine-Vitamin A [phone] MG-UNIT CAPS Take 1 tablet by mouth daily.    Marland Kitchen losartan (COZAAR) 50 MG tablet Take 1 tablet (50 mg total) by mouth 2 (two) times daily. 180 tablet 3  . Magnesium 100 MG CAPS Take 100 mg by mouth daily.    . Multiple Vitamin (MULTIVITAMIN WITH MINERALS) TABS Take 1 tablet by mouth daily.    Marland Kitchen neomycin-polymyxin b-dexamethasone (MAXITROL) 3.5-10000-0.1 OINT PLACE 1 APPLICATION INTO ONE EYE NIGHTLY (LEFT EYE) prn  3   No current facility-administered medications for this encounter.     BP 136/64   Pulse 77   Wt 152 lb 8 oz (69.2 kg)   SpO2 99%   BMI 27.89 kg/m  General: NAD Neck: No JVD, no thyromegaly or thyroid nodule.  Lungs: Clear to auscultation bilaterally with normal respiratory effort. CV: Nondisplaced PMI.  Heart regular S1/S2, no S3/S4, no murmur.  No peripheral edema.  No carotid bruit.  Normal pedal pulses.  Abdomen: Soft, nontender, no hepatosplenomegaly, no distention.  Neurologic: Alert and oriented x 3.  Psych: Normal affect. Extremities: No clubbing or cyanosis.   Assessment/Plan: 1. Takotsubo cardiomyopathy: Resolved, EF normal on echo in 6/17.  - She will remain on losartan 50 mg bid. BMET today.  - She has stopped bisoprolol.  I think that she may develop dyspnea/wheezing/respiratory decompensation on beta blockers and would avoid (had trouble with Coreg then bisoprolol).   2. Atrial fibrillation: Paroxysmal.  She has had afib ablation  x 2. She stopped Tikosyn in 3/17 but restarted in 9/17 and had DCCV to NSR.  She is in NSR today.   - Continue Eliquis. CBC today.  - Continue dofetilide.  ECG ok today (QTc not prolonged).  Needs BMET and Mg checked today.  3. HTN: BP is under reasonable control on current regimen.    Followup in 3 months.    Loralie Champagne 12/29/2016

## 2017-01-16 ENCOUNTER — Other Ambulatory Visit: Payer: Self-pay | Admitting: Cardiology

## 2017-01-18 NOTE — Telephone Encounter (Signed)
Pt requesting 10 day supply Eliquis '5mg'$ ;Last seen by HF Clinic on 12/28/16, Crea-0.49 on 12/28/16, Age- 71yr old, Wt-71.4kg. Will send a 10 day supply as requested.

## 2017-02-09 ENCOUNTER — Other Ambulatory Visit: Payer: Self-pay | Admitting: *Deleted

## 2017-02-09 NOTE — Telephone Encounter (Signed)
called asking for refills, she has refills on file with [facility] for Tikosyn, Losartan & Eliquis, pt expressed understanding when this was explained.

## 2017-03-31 ENCOUNTER — Other Ambulatory Visit (HOSPITAL_COMMUNITY): Payer: Self-pay | Admitting: *Deleted

## 2017-04-06 ENCOUNTER — Encounter (HOSPITAL_COMMUNITY): Payer: Medicare Other

## 2017-04-07 ENCOUNTER — Telehealth (HOSPITAL_COMMUNITY): Payer: Self-pay | Admitting: Cardiology

## 2017-04-07 NOTE — Telephone Encounter (Signed)
Patient left message that she is out of town and will need a emergency supply of Tikosyn  Only pharmacy number and name of pharmacist PAM left

## 2017-04-12 NOTE — Telephone Encounter (Signed)
Detailed message left for patient to return call if something further is needed

## 2017-05-17 ENCOUNTER — Telehealth (HOSPITAL_COMMUNITY): Payer: Self-pay

## 2017-05-17 ENCOUNTER — Other Ambulatory Visit (HOSPITAL_COMMUNITY): Payer: Self-pay

## 2017-05-17 MED ORDER — APIXABAN 5 MG PO TABS: 5.0000 mg | ORAL_TABLET | Freq: Two times a day (BID) | ORAL | 2 refills | Status: DC

## 2017-05-17 MED ORDER — LOSARTAN POTASSIUM 50 MG PO TABS: 50.0000 mg | ORAL_TABLET | Freq: Two times a day (BID) | ORAL | 3 refills | Status: DC

## 2017-05-17 MED ORDER — DOFETILIDE 500 MCG PO CAPS: 500.0000 ug | ORAL_CAPSULE | Freq: Two times a day (BID) | ORAL | 2 refills | Status: DC

## 2017-05-17 NOTE — Telephone Encounter (Signed)
[facility]. Refill sent to preferred pharmacy.  Renee Pain, RN

## 2017-05-27 ENCOUNTER — Encounter (HOSPITAL_COMMUNITY): Payer: Medicare Other | Admitting: Cardiology

## 2017-06-16 ENCOUNTER — Encounter (HOSPITAL_COMMUNITY): Payer: Medicare Other | Admitting: Cardiology

## 2017-07-21 ENCOUNTER — Encounter (HOSPITAL_COMMUNITY): Payer: Medicare Other | Admitting: Cardiology

## 2017-08-23 ENCOUNTER — Encounter (HOSPITAL_COMMUNITY): Payer: Self-pay | Admitting: Cardiology

## 2017-08-23 ENCOUNTER — Ambulatory Visit (HOSPITAL_COMMUNITY)
Admission: RE | Admit: 2017-08-23 | Discharge: 2017-08-23 | Disposition: A | Discharge status: Home / Self Care | Payer: Medicare Other | Source: Ambulatory Visit | Attending: Cardiology | Admitting: Cardiology

## 2017-08-23 VITALS — BP 186/69 | HR 73 | Wt 151.2 lb

## 2017-08-23 DIAGNOSIS — I480 Paroxysmal atrial fibrillation: Secondary | ICD-10-CM | POA: Insufficient documentation

## 2017-08-23 DIAGNOSIS — I481 Persistent atrial fibrillation: Secondary | ICD-10-CM

## 2017-08-23 DIAGNOSIS — I5181 Takotsubo syndrome: Secondary | ICD-10-CM

## 2017-08-23 DIAGNOSIS — I10 Essential (primary) hypertension: Secondary | ICD-10-CM | POA: Insufficient documentation

## 2017-08-23 DIAGNOSIS — I5022 Chronic systolic (congestive) heart failure: Secondary | ICD-10-CM | POA: Diagnosis not present

## 2017-08-23 DIAGNOSIS — Z7901 Long term (current) use of anticoagulants: Secondary | ICD-10-CM | POA: Diagnosis not present

## 2017-08-23 DIAGNOSIS — I4819 Other persistent atrial fibrillation: Secondary | ICD-10-CM

## 2017-08-23 DIAGNOSIS — Z79899 Other long term (current) drug therapy: Secondary | ICD-10-CM | POA: Insufficient documentation

## 2017-08-23 LAB — LIPID PANEL
Cholesterol: 206 mg/dL — ABNORMAL HIGH (ref 0–200)
HDL: 77 mg/dL (ref >40)
LDL Cholesterol: 112 mg/dL — ABNORMAL HIGH (ref 0–99)
Total CHOL/HDL Ratio: 2.7 RATIO
Triglycerides: 86 mg/dL (ref <150)
VLDL: 17 mg/dL (ref 0–40)

## 2017-08-23 LAB — BASIC METABOLIC PANEL
Anion gap: 6 (ref 5–15)
BUN: 8 mg/dL (ref 6–20)
CO2: 30 mmol/L (ref 22–32)
Calcium: 9.2 mg/dL (ref 8.9–10.3)
Chloride: 101 mmol/L (ref 101–111)
Creatinine, Ser: 0.48 mg/dL (ref 0.44–1.00)
GFR calc Af Amer: >60 mL/min (ref >60)
GFR calc non Af Amer: >60 mL/min (ref >60)
Glucose, Bld: 117 mg/dL — ABNORMAL HIGH (ref 65–99)
Potassium: 5.1 mmol/L (ref 3.5–5.1)
Sodium: 137 mmol/L (ref 135–145)

## 2017-08-23 LAB — CBC
HCT: 40.8 % (ref 36.0–46.0)
Hemoglobin: 13.9 g/dL (ref 12.0–15.0)
MCH: 35.4 pg — ABNORMAL HIGH (ref 26.0–34.0)
MCHC: 34.1 g/dL (ref 30.0–36.0)
MCV: 103.8 fL — ABNORMAL HIGH (ref 78.0–100.0)
Platelets: 275 10*3/uL (ref 150–400)
RBC: 3.93 MIL/uL (ref 3.87–5.11)
RDW: 12.3 % (ref 11.5–15.5)
WBC: 6.5 10*3/uL (ref 4.0–10.5)

## 2017-08-23 LAB — TSH: TSH: 1.557 u[IU]/mL (ref 0.350–4.500)

## 2017-08-23 LAB — MAGNESIUM: Magnesium: 2.1 mg/dL (ref 1.7–2.4)

## 2017-08-23 MED ORDER — AMLODIPINE BESYLATE 2.5 MG PO TABS: 2.5000 mg | ORAL_TABLET | Freq: Every day | ORAL | 3 refills | Status: DC

## 2017-08-23 NOTE — Patient Instructions (Signed)
Start Amlodipine 2.5 mg (1 tab) daily  Your physician has recommended that you wear an event monitor. Event monitors are medical devices that record the heart's electrical activity. Doctors most often [place] these monitors to diagnose arrhythmias. Arrhythmias are problems with the speed or rhythm of the heartbeat. The monitor is a small, portable device. You can wear one while you do your normal daily activities. This is usually used to diagnose what is causing palpitations/syncope (passing out).  You have been referred to EP to Vita Barley at [facility] drawn today (if we do not call you, then your lab work was stable)   Your physician recommends that you schedule a follow-up appointment in: 6 months

## 2017-08-23 NOTE — Progress Notes (Signed)
Patient ID: Maria Arnold, female   DOB: 06-Aug-1946, 71 y.o.   MRN: [id number] PCP: Dr Tammi Klippel Cardiology: Dr. Aundra Dubin  71 yo with hospital admission in 2/12 for atrial fibrillation and CHF presents for followup of atrial fibrillation.  For about a week prior to initial admission, she had had congestion and shortness of breath. She thought she had a bad cold. She was seen by her PCP and ECG showed atrial fibrillation with RVR. She was admitted to [facility] for evaluation. CXR did not show PNA. She had wheezing consistent with a prior asthma diagnosis. She was then noted by echo to have EF 30-35% with peri-apical severe hypokinesis. LHC was done, there was no angiographic coronary disease. 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 She had suffered an emotional shock about a week prior to admission when her daughter-in-law had a miscarriage.   She underwent TEE-guided cardioversion, but this failed to convert her to NSR. She was then started on Tikosyn, which did convert her to NSR but her QTc prolonged in the setting of using moxifloxacin for acute bronchitis treatment. Moxifloxacin was stopped and the dofetilide dose was cut back to 250 micrograms two times a day. At this dose, QTc was normal.  Repeat echo done in 7/12 showed EF improved to 55-60% with normal RV and valves.  She underwent atrial fibrillation ablation in 11/13.  In 4/14, we talked about stopping Tikosyn and she got very nervous.  She actually went into atrial fibrillation in the office so I did not stop Tikosyn.   She had a re-do atrial fibrillation ablation in 4/16.   She saw Dr Rayann Heman in 3/17 and stopped Tikosyn and bisoprolol.  Since stopping bisoprolol, she has felt much better, no further wheezing or dyspnea.  She went back into atrial fibrillation and was reloaded on Tikosyn and cardioverted in 9/17.   Today, she is in NSR.  She continues to feel episodes of symptomatic atrial  fibrillation a couple of times a week, but they are generally short-lived.  No exertional dyspnea or chest pain.  She has been walking for exercise.  No lightheadedness/syncope.  BP is running high, quite high here today but generally in the 517O systolic when she checks at home. She is, of note, in need of foot surgery eventually.    ECG (personally reviewed): NSR, QTc 438 msec   Labs (2/12): K 4.8, creatinine 0.67  Labs (3/12): K 4.2, creatinine 0.5  Labs (11/13): K 3.8, creatinine 0.5 Labs (4/14): K 4.1, creatinine 0.7, HCT 44.7 Labs (12/14): K 4.1, creatinine 0.6, HCT 42, TSH normal, Mg 2.1 Labs (8/15): K 4.2, creatinine 0.5, HCT 41, thyroid indices normal Labs (4/16): K 3.8, creatinine 0.46, Mg 1.8 Labs (9/17): K 5.1, creatinine 0.5, Mg 2 Labs (2/18): LDL 101, HDL 72, hgb 13.2, K 4.6, creatinine 0.49, Mg 1.8  Allergies (verified):  No Known Drug Allergies   Past Medical History:  1. Paroxysmal atrial fibrillation with rapid ventricular response. She is on coumadin and dofetilide. She has had some breakthrough atrial fib on dofetilide. Atrial fibrillation ablation in 11/13 and redo in 4/16. Dofetilide stopped 3/17, had breakthrough atrial fibrillation after this. Reloaded with Tikosyn in 9/17 and cardioverted to NSR.  2. Cardiomyopathy: Nonischemic. LHC (2/12): No coronary disease, EF 30-35% with periapical severe hypokinesis. Echo (2/12): EF 30-35%, periapical severe hypokinesis, normal RV, mild to moderate MR. 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  Repeat echo in 7/12 showed EF 55-60%, mild biatrial enlargement, normal RV, normal valves.  - TEE (4/16) with EF 55-60%. - Echo (6/17) with EF 55-60%.  3. Asthma: Beta blocker intolerance.  4. HTN  5. Foot pain from bone spur  6. Low back pain  7. Narrow angle glaucoma 8. Nasal bone fracture s/p ENT surgery   Family History:  No premature CAD    Social History:  Lives in [place] with husband. 2 children, 4 grandchildren. Nonsmoker, occasional ETOH.   Review of Systems  All systems reviewed and negative except as per HPI.   Current Outpatient Prescriptions  Medication Sig Dispense Refill  . apixaban (ELIQUIS) 5 MG TABS tablet Take 1 tablet (5 mg total) by mouth 2 (two) times daily. 180 tablet 2  . Ascorbic Acid (VITAMIN C) 500 MG tablet Take 500 mg by mouth daily.      . Calcium-Magnesium-Vitamin D 185-50-100 MG-MG-UNIT CAPS Take 1 tablet by mouth daily.    . Cyanocobalamin (VITAMIN B-12 CR PO) Take 3 tablets by mouth daily.     Marland Kitchen dofetilide (TIKOSYN) 500 MCG capsule Take 1 capsule (500 mcg total) by mouth 2 (two) times daily. 180 capsule 2  . Iodine-Vitamin A [id number] MG-UNIT CAPS Take 1 tablet by mouth daily.    Marland Kitchen losartan (COZAAR) 50 MG tablet Take 1 tablet (50 mg total) by mouth 2 (two) times daily. 180 tablet 3  . Magnesium 100 MG CAPS Take 100 mg by mouth daily.    . Multiple Vitamin (MULTIVITAMIN WITH MINERALS) TABS Take 1 tablet by mouth daily.    Marland Kitchen neomycin-polymyxin b-dexamethasone (MAXITROL) 3.5-10000-0.1 OINT PLACE 1 APPLICATION INTO ONE EYE NIGHTLY (LEFT EYE) prn  3  . amLODipine (NORVASC) 2.5 MG tablet Take 1 tablet (2.5 mg total) by mouth daily. 180 tablet 3   No current facility-administered medications for this encounter.     BP (!) 186/69   Pulse 73   Wt 151 lb 4 oz (68.6 kg)   SpO2 99%   BMI 27.66 kg/m  General: NAD Neck: No JVD, no thyromegaly or thyroid nodule.  Lungs: Clear to auscultation bilaterally with normal respiratory effort. CV: Nondisplaced PMI.  Heart regular S1/S2, no S3/S4, no murmur.  No peripheral edema.  No carotid bruit.  Normal pedal pulses.  Abdomen: Soft, nontender, no hepatosplenomegaly, no distention.  Skin: Intact without lesions or rashes.  Neurologic: Alert and oriented x 3.  Psych: Normal affect. Extremities: No clubbing or cyanosis.  HEENT: Normal.    Assessment/Plan: 1. Takotsubo cardiomyopathy: Resolved, EF normal on echo in 6/17.  - She will remain on losartan 50 mg bid. BMET today.  - She has stopped bisoprolol.  I think that she may develop dyspnea/wheezing/respiratory decompensation on beta blockers and would avoid (had trouble with Coreg then bisoprolol).   2. Atrial fibrillation: Paroxysmal.  She has had afib ablation x 2. She stopped Tikosyn in 3/17 but restarted in 9/17 and had DCCV to NSR.  She is in NSR today.  She has frequent but short-lived symptomatic breakthrough episodes.   - Continue Eliquis. CBC today.  - Continue dofetilide.  ECG ok today (QTc not prolonged).  Needs BMET and Mg checked today.  - She is interested in a 3rd ablation given ongoing symptoms.  She has heard about Dr. Lehman Prom at [facility] and is interested in a 2nd opinion.  I will arrange for a 2 week event monitor to document atrial fibrillation burden and will refer her to Dr. Lehman Prom.  3. HTN: BP is high.  Continue losartan and add amlodipine 2.5 mg daily. She will continue to follow BP readings at home and will get them to me to review.     Followup in 6 months.    Loralie Champagne 08/23/2017

## 2017-08-25 ENCOUNTER — Other Ambulatory Visit (HOSPITAL_COMMUNITY): Payer: Self-pay | Admitting: *Deleted

## 2017-08-25 ENCOUNTER — Telehealth (HOSPITAL_COMMUNITY): Payer: Self-pay | Admitting: *Deleted

## 2017-08-25 DIAGNOSIS — E7800 Pure hypercholesterolemia, unspecified: Secondary | ICD-10-CM

## 2017-08-25 MED ORDER — LOSARTAN POTASSIUM 50 MG PO TABS: 50.0000 mg | ORAL_TABLET | Freq: Two times a day (BID) | ORAL | 3 refills | Status: DC

## 2017-08-25 NOTE — Telephone Encounter (Signed)
Notes recorded by Scarlette Calico, RN on 08/25/2017 at 10:14 AM EDT Pt aware and agreeable, she will make dietary changes and would like to recheck again in a few months. Repeat lipid panel scheduled for 10/25/17 ------  Notes recorded by Harvie Junior, CMA on 08/24/2017 at 3:56 PM EDT Left VM for pt to call for lab results. ------  Notes recorded by Larey Dresser, MD on 08/23/2017 at 3:54 PM EDT LDL is trending higher, needs to watch diet more closely.

## 2017-10-12 ENCOUNTER — Encounter: Payer: Self-pay | Admitting: Cardiology

## 2017-10-25 ENCOUNTER — Other Ambulatory Visit (HOSPITAL_COMMUNITY): Payer: Medicare Other

## 2017-11-11 ENCOUNTER — Other Ambulatory Visit (HOSPITAL_COMMUNITY): Payer: Medicare Other

## 2017-11-16 ENCOUNTER — Other Ambulatory Visit (HOSPITAL_COMMUNITY): Payer: Medicare Other

## 2018-01-18 ENCOUNTER — Telehealth (HOSPITAL_COMMUNITY): Payer: Self-pay | Admitting: *Deleted

## 2018-01-18 DIAGNOSIS — I480 Paroxysmal atrial fibrillation: Secondary | ICD-10-CM

## 2018-01-18 MED ORDER — AMLODIPINE BESYLATE 2.5 MG PO TABS: 2.5000 mg | ORAL_TABLET | Freq: Every day | ORAL | 3 refills | Status: DC

## 2018-01-18 MED ORDER — LOSARTAN POTASSIUM 50 MG PO TABS: 50.0000 mg | ORAL_TABLET | Freq: Two times a day (BID) | ORAL | 3 refills | Status: DC

## 2018-01-18 MED ORDER — DOFETILIDE 500 MCG PO CAPS: 500.0000 ug | ORAL_CAPSULE | Freq: Two times a day (BID) | ORAL | 3 refills | Status: DC

## 2018-01-18 MED ORDER — APIXABAN 5 MG PO TABS: 5.0000 mg | ORAL_TABLET | Freq: Two times a day (BID) | ORAL | 3 refills | Status: DC

## 2018-01-18 NOTE — Telephone Encounter (Signed)
Patient called asking for refills.  Refills sent as requested.  Patient was also asking for me to send a message to Dr. Aundra Dubin asking if she could switch Losartan to a different medication.  She has started experiencing hair loss and Dr. Lehman Prom suggested Losartan couldn't be the cause.  Will forward to Dr. Aundra Dubin to see about switching Losartan.

## 2018-01-19 MED ORDER — VALSARTAN 80 MG PO TABS: 80.0000 mg | ORAL_TABLET | Freq: Every day | ORAL | 3 refills | Status: DC

## 2018-01-19 MED ORDER — VALSARTAN 80 MG PO TABS: 80.0000 mg | ORAL_TABLET | Freq: Two times a day (BID) | ORAL | 3 refills | Status: DC

## 2018-01-19 NOTE — Telephone Encounter (Signed)
She can stop losartan and start valsartan 80 mg bid.  She will need BMET in 10 days.

## 2018-01-19 NOTE — Addendum Note (Signed)
Addended by: Darron Doom on: 01/19/2018 11:30 AM   Modules accepted: Orders

## 2018-01-19 NOTE — Telephone Encounter (Signed)
Called and spoke with patient, she is agreeable with plan.  Bmet and medications updated. Lab appointment scheduled.

## 2018-01-24 ENCOUNTER — Other Ambulatory Visit (HOSPITAL_COMMUNITY): Payer: Self-pay

## 2018-01-24 ENCOUNTER — Other Ambulatory Visit (HOSPITAL_COMMUNITY): Payer: Self-pay | Admitting: *Deleted

## 2018-01-24 MED ORDER — AMOXICILLIN-POT CLAVULANATE 875-125 MG PO TABS: 1.0000 | ORAL_TABLET | Freq: Two times a day (BID) | ORAL | 0 refills | Status: AC

## 2018-01-24 MED ORDER — AMOXICILLIN-POT CLAVULANATE 875-125 MG PO TABS: 1.0000 | ORAL_TABLET | Freq: Two times a day (BID) | ORAL | 0 refills | Status: DC

## 2018-01-26 ENCOUNTER — Other Ambulatory Visit (HOSPITAL_COMMUNITY): Payer: Self-pay

## 2018-01-26 MED ORDER — ALBUTEROL SULFATE HFA 108 (90 BASE) MCG/ACT IN AERS: 2.0000 | INHALATION_SPRAY | Freq: Four times a day (QID) | RESPIRATORY_TRACT | 2 refills | Status: DC | PRN

## 2018-01-27 ENCOUNTER — Other Ambulatory Visit (HOSPITAL_COMMUNITY): Payer: Self-pay

## 2018-01-27 MED ORDER — ALBUTEROL SULFATE (2.5 MG/3ML) 0.083% IN NEBU: 2.5000 mg | INHALATION_SOLUTION | Freq: Four times a day (QID) | RESPIRATORY_TRACT | 12 refills | Status: DC | PRN

## 2018-01-28 ENCOUNTER — Other Ambulatory Visit (HOSPITAL_COMMUNITY): Payer: Self-pay | Admitting: *Deleted

## 2018-01-28 MED ORDER — ALBUTEROL SULFATE (2.5 MG/3ML) 0.083% IN NEBU: 2.5000 mg | INHALATION_SOLUTION | Freq: Four times a day (QID) | RESPIRATORY_TRACT | 12 refills | Status: DC | PRN

## 2018-02-01 ENCOUNTER — Other Ambulatory Visit (HOSPITAL_COMMUNITY): Payer: Self-pay | Admitting: *Deleted

## 2018-02-01 MED ORDER — ALBUTEROL SULFATE (2.5 MG/3ML) 0.083% IN NEBU: 2.5000 mg | INHALATION_SOLUTION | Freq: Four times a day (QID) | RESPIRATORY_TRACT | 12 refills | Status: DC | PRN

## 2018-02-03 ENCOUNTER — Telehealth (HOSPITAL_COMMUNITY): Payer: Self-pay | Admitting: Cardiology

## 2018-02-03 ENCOUNTER — Other Ambulatory Visit (HOSPITAL_COMMUNITY): Payer: Medicare Other

## 2018-02-03 MED ORDER — VALSARTAN 80 MG PO TABS: 80.0000 mg | ORAL_TABLET | Freq: Two times a day (BID) | ORAL | 3 refills | Status: DC

## 2018-02-03 NOTE — Telephone Encounter (Signed)
Patient was recently changed from losartan to valsartan Never received medication from mail order.  Advised another medication order will be sent today, be sure to have labs drawn 10 -14 days after starting.  Patient appreciative of return call and will be sure to get labs done

## 2018-02-23 ENCOUNTER — Other Ambulatory Visit (HOSPITAL_COMMUNITY): Payer: Self-pay | Admitting: *Deleted

## 2018-02-23 ENCOUNTER — Other Ambulatory Visit (HOSPITAL_COMMUNITY): Payer: Medicare Other

## 2018-02-23 ENCOUNTER — Telehealth (HOSPITAL_COMMUNITY): Payer: Self-pay | Admitting: *Deleted

## 2018-02-23 MED ORDER — VALSARTAN 80 MG PO TABS: 80.0000 mg | ORAL_TABLET | Freq: Two times a day (BID) | ORAL | 3 refills | Status: DC

## 2018-02-23 NOTE — Telephone Encounter (Signed)
Advanced Heart Failure Triage Encounter  Patient Name: Maria Arnold  Date of Call: 02/23/18  Problem:  Patient called to report that she still hasn't be able to start Valsartan due to her pharmacy not carrying it.  Asked for her to send it to a local [facility].    Plan:  Medication sent to preferred pharmacy, since patient isn't starting medication until tomorrow we moved her lab appointment day back to April 30th.  No further questions.   Darron Doom, RN

## 2018-03-08 ENCOUNTER — Other Ambulatory Visit (HOSPITAL_COMMUNITY): Payer: Medicare Other

## 2018-03-16 ENCOUNTER — Encounter (HOSPITAL_COMMUNITY): Payer: Medicare Other | Admitting: Cardiology

## 2018-03-29 ENCOUNTER — Other Ambulatory Visit (HOSPITAL_COMMUNITY): Payer: Medicare Other

## 2018-05-03 ENCOUNTER — Other Ambulatory Visit (HOSPITAL_COMMUNITY): Payer: Medicare Other

## 2018-05-17 ENCOUNTER — Other Ambulatory Visit (HOSPITAL_COMMUNITY): Payer: Medicare Other

## 2018-05-23 ENCOUNTER — Other Ambulatory Visit (HOSPITAL_COMMUNITY): Payer: Medicare Other

## 2018-05-25 ENCOUNTER — Encounter (HOSPITAL_COMMUNITY): Payer: Medicare Other | Admitting: Cardiology

## 2018-07-06 ENCOUNTER — Other Ambulatory Visit: Payer: Self-pay

## 2018-07-06 ENCOUNTER — Emergency Department (HOSPITAL_COMMUNITY)
Admission: EM | Admit: 2018-07-06 | Discharge: 2018-07-06 | Disposition: A | Discharge status: Home / Self Care | Payer: Medicare Other | Attending: Emergency Medicine | Admitting: Emergency Medicine

## 2018-07-06 ENCOUNTER — Encounter (HOSPITAL_COMMUNITY): Payer: Self-pay | Admitting: Emergency Medicine

## 2018-07-06 ENCOUNTER — Emergency Department (HOSPITAL_COMMUNITY): Payer: Medicare Other

## 2018-07-06 DIAGNOSIS — I5022 Chronic systolic (congestive) heart failure: Secondary | ICD-10-CM | POA: Diagnosis not present

## 2018-07-06 DIAGNOSIS — I4891 Unspecified atrial fibrillation: Secondary | ICD-10-CM | POA: Insufficient documentation

## 2018-07-06 DIAGNOSIS — Z79899 Other long term (current) drug therapy: Secondary | ICD-10-CM | POA: Diagnosis not present

## 2018-07-06 DIAGNOSIS — Z7901 Long term (current) use of anticoagulants: Secondary | ICD-10-CM | POA: Diagnosis not present

## 2018-07-06 DIAGNOSIS — I110 Hypertensive heart disease with heart failure: Secondary | ICD-10-CM | POA: Diagnosis not present

## 2018-07-06 DIAGNOSIS — I480 Paroxysmal atrial fibrillation: Secondary | ICD-10-CM | POA: Diagnosis not present

## 2018-07-06 DIAGNOSIS — J45909 Unspecified asthma, uncomplicated: Secondary | ICD-10-CM | POA: Insufficient documentation

## 2018-07-06 DIAGNOSIS — Z87891 Personal history of nicotine dependence: Secondary | ICD-10-CM | POA: Diagnosis not present

## 2018-07-06 DIAGNOSIS — R079 Chest pain, unspecified: Secondary | ICD-10-CM | POA: Diagnosis present

## 2018-07-06 LAB — CBC
HCT: 42.6 % (ref 36.0–46.0)
Hemoglobin: 14.3 g/dL (ref 12.0–15.0)
MCH: 35.5 pg — ABNORMAL HIGH (ref 26.0–34.0)
MCHC: 33.6 g/dL (ref 30.0–36.0)
MCV: 105.7 fL — ABNORMAL HIGH (ref 78.0–100.0)
Platelets: 249 10*3/uL (ref 150–400)
RBC: 4.03 MIL/uL (ref 3.87–5.11)
RDW: 12 % (ref 11.5–15.5)
WBC: 7.8 10*3/uL (ref 4.0–10.5)

## 2018-07-06 LAB — BASIC METABOLIC PANEL
Anion gap: 11 (ref 5–15)
BUN: 10 mg/dL (ref 8–23)
CO2: 25 mmol/L (ref 22–32)
Calcium: 9 mg/dL (ref 8.9–10.3)
Chloride: 101 mmol/L (ref 98–111)
Creatinine, Ser: 0.54 mg/dL (ref 0.44–1.00)
GFR calc Af Amer: >60 mL/min (ref >60)
GFR calc non Af Amer: >60 mL/min (ref >60)
Glucose, Bld: 199 mg/dL — ABNORMAL HIGH (ref 70–99)
Potassium: 4.3 mmol/L (ref 3.5–5.1)
Sodium: 137 mmol/L (ref 135–145)

## 2018-07-06 LAB — I-STAT TROPONIN, ED: Troponin i, poc: 0.1 ng/mL (ref 0.00–0.08)

## 2018-07-06 MED ORDER — METOPROLOL TARTRATE 5 MG/5ML IV SOLN
2.5000 mg | Freq: Once | INTRAVENOUS | Status: AC
  Administered 2018-07-06: 2.5 mg via INTRAVENOUS
  Filled 2018-07-06: qty 5

## 2018-07-06 MED ORDER — METOPROLOL SUCCINATE ER 50 MG PO TB24
50.0000 mg | ORAL_TABLET | Freq: Every day | ORAL | Status: DC
  Administered 2018-07-06: 50 mg via ORAL
  Filled 2018-07-06: qty 1

## 2018-07-06 MED ORDER — LORAZEPAM 2 MG/ML IJ SOLN
0.2500 mg | Freq: Once | INTRAMUSCULAR | Status: AC
  Administered 2018-07-06: 0.25 mg via INTRAVENOUS
  Filled 2018-07-06: qty 1

## 2018-07-06 MED ORDER — ASPIRIN 81 MG PO CHEW
324.0000 mg | CHEWABLE_TABLET | Freq: Once | ORAL | Status: AC
  Administered 2018-07-06: 324 mg via ORAL
  Filled 2018-07-06: qty 4

## 2018-07-06 NOTE — ED Provider Notes (Addendum)
[place] EMERGENCY DEPARTMENT Provider Note   CSN: [id number] Arrival date & time: 07/06/18  1444     History   Chief Complaint Chief Complaint  Patient presents with  . Chest Pain    HPI Maria Arnold is a 72 y.o. female.  The history is provided by the patient. No language interpreter was used.  Chest Pain     Maria Arnold is a 72 y.o. female who presents to the Emergency Department complaining of chest pain. She presents to the emergency department complaining of chest pain that began around 1 o'clock today. She was in her routine state of health when she woke this morning. Around 1030 she ate a banana and took her daily medications. At 1230 she had a couple coffee, which is unusual for her. Around 1 o'clock she developed left sided chest pressure/tightness as well as recurrence atrial fibrillation. Chest pressure is waxing and waning in nature with no clear alleviating or worsening factors. She has a history of atrial fibrillation and goes in and out of it throughout the day. She is followed by cardiology with plans for ablation later this fall. She is been on Tikosyn 500 twice daily for the last year and a half. She takes eloquence as well. No recent illnesses. No fever, shortness of breath, nausea, vomiting, diarrhea, leg swelling or pain. Past Medical History:  Diagnosis Date  . Asthma   . Beta-blocker intolerance   . Foot pain    From bone spur  . History of narrow angle glaucoma   . HTN (hypertension)   . NICM (nonischemic cardiomyopathy) ([place])    a. EF 30-35% in 2012 -> LHC (2/12): No coronary disease, Cardiomyopathy felt either tachycardia-mediated or Takotsubo (emotional shock the week before hospitalization). b. f/u echo 05/2011 with EF 55-60%, normal RV, normal valves.   . Paroxysmal atrial fibrillation (HCC)    a. AF ablation x 2. b. Previously on Tikosyn.    Patient Active Problem List   Diagnosis Date Noted  . Persistent atrial  fibrillation ([place]) 07/15/2016  . Paroxysmal atrial fibrillation (HCC)   . A-fib ([place]) 02/12/2015  . Encounter for therapeutic drug monitoring 12/29/2013  . 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 Conjunctival hemorrhage 02/28/2012  . Hypertension 08/24/2011  . Long term (current) use of anticoagulants 02/05/2011  . Atrial fibrillation ([place]) 01/09/2011  . SYSTOLIC HEART FAILURE, CHRONIC 01/09/2011    Past Surgical History:  Procedure Laterality Date  . ATRIAL FIBRILLATION ABLATION N/A 09/22/2012   Procedure: ATRIAL FIBRILLATION ABLATION;  Surgeon: [name], MD;  Location: [facility] CATH LAB;  Service: Cardiovascular;  Laterality: N/A;  . ATRIAL FIBRILLATION ABLATION  02/12/2015  . ATRIAL FIBRILLATION ABLATION N/A 02/12/2015   Procedure: ATRIAL FIBRILLATION ABLATION;  Surgeon: [name], MD;  Location[facility] CATH LAB;  Service: Cardiovascular;  Laterality: N/A;  . CARDIAC CATHETERIZATION  12/2010  . CARDIOVERSION N/A 07/17/2016   Procedure: CARDIOVERSION;  Surgeon: Thayer Headings, MD;  Location: [facility] ENDOSCOPY;  Service: Cardiovascular;  Laterality: N/A;  . DILATION AND CURETTAGE OF UTERUS    . LAPAROSCOPIC ABDOMINAL EXPLORATION  1980's  . NASAL SINUS SURGERY  1980's  . TEE WITHOUT CARDIOVERSION  09/21/2012   Procedure: TRANSESOPHAGEAL ECHOCARDIOGRAM (TEE);  Surgeon: Thayer Headings, MD;  Location: [facility];  Service: Cardiovascular;  Laterality: N/A;  . TEE WITHOUT CARDIOVERSION N/A 02/12/2015   Procedure: TRANSESOPHAGEAL ECHOCARDIOGRAM (TEE);  Surgeon: Josue Hector, MD;  Location: [place];  Service: Cardiovascular;  Laterality: N/A;  . TONSILLECTOMY  AND ADENOIDECTOMY  1953     OB History   None      Home Medications    Prior to Admission medications   Medication Sig Start Date End Date Taking? Authorizing Provider  albuterol (PROVENTIL HFA;VENTOLIN HFA) 108 (90 Base) MCG/ACT inhaler Inhale 2 puffs into the lungs every 6 (six) hours as needed for wheezing or shortness of breath.  01/26/18  Yes Larey Dresser, MD  albuterol (PROVENTIL) (2.5 MG/3ML) 0.083% nebulizer solution Take 3 mLs (2.5 mg total) by nebulization every 6 (six) hours as needed for wheezing or shortness of breath. 02/01/18  Yes Larey Dresser, MD  amLODipine (NORVASC) 2.5 MG tablet Take 1 tablet (2.5 mg total) by mouth daily. 01/18/18  Yes Larey Dresser, MD  apixaban (ELIQUIS) 5 MG TABS tablet Take 1 tablet (5 mg total) by mouth 2 (two) times daily. 01/18/18  Yes Larey Dresser, MD  carboxymethylcellulose (REFRESH TEARS) 0.5 % SOLN Apply 1-2 drops to eye 3 (three) times daily as needed (dry eyes).   Yes [provider]  dofetilide (TIKOSYN) 500 MCG capsule Take 1 capsule (500 mcg total) by mouth 2 (two) times daily. 01/18/18  Yes Larey Dresser, MD  valsartan (DIOVAN) 80 MG tablet Take 1 tablet (80 mg total) by mouth 2 (two) times daily. 02/23/18  Yes Larey Dresser, MD    Family History Family History  Problem Relation Age of Onset  . Prostate cancer Father   . Skin cancer Father   . Emphysema Mother   . Coronary artery disease Neg Hx        Premature    Social History Social History   Tobacco Use  . Smoking status: Former Smoker    Years: 11.00    Types: Cigarettes  . Smokeless tobacco: Never Used  . Tobacco comment: "quit smoking in 1976"  Substance Use Topics  . Alcohol use: Yes    Alcohol/week: 7.0 standard drinks    Types: 7 Glasses of wine per week  . Drug use: No     Allergies   Lisinopril   Review of Systems Review of Systems  Cardiovascular: Positive for chest pain.  All other systems reviewed and are negative.    Physical Exam Updated Vital Signs BP 121/74 (BP Location: Left Arm)   Pulse 74   Temp (!) 97.5 F (36.4 C) (Oral)   Resp 18   Ht 5\' 2"  (1.575 m)   Wt 63 kg   SpO2 98%   BMI 25.42 kg/m   Physical Exam  Constitutional: She is oriented to person, place, and time. She appears well-developed and well-nourished.  HENT:  Head:  Normocephalic and atraumatic.  Cardiovascular:  No murmur heard. Tachycardic and irregular  Pulmonary/Chest: Effort normal and breath sounds normal. No respiratory distress.  Abdominal: Soft. There is no tenderness. There is no rebound and no guarding.  Musculoskeletal: She exhibits no edema or tenderness.  Neurological: She is alert and oriented to person, place, and time.  Skin: Skin is warm and dry.  Psychiatric: Her behavior is normal.  Anxious appearing  Nursing note and vitals reviewed.    ED Treatments / Results  Labs (all labs ordered are listed, but only abnormal results are displayed) Labs Reviewed  BASIC METABOLIC PANEL - Abnormal; Notable for the following components:      Result Value   Glucose, Bld 199 (*)    All other components within normal limits  CBC - Abnormal; Notable for the following components:  MCV 105.7 (*)    MCH 35.5 (*)    All other components within normal limits  I-STAT TROPONIN, ED - Abnormal; Notable for the following components:   Troponin i, poc 0.10 (*)    All other components within normal limits    EKG EKG Interpretation  Date/Time:  Wednesday July 06 2018 18:53:45 EDT Ventricular Rate:  79 PR Interval:    QRS Duration: 86 QT Interval:  374 QTC Calculation: 429 R Axis:   61 Text Interpretation:  Sinus rhythm Consider left atrial enlargement Borderline T wave abnormalities Confirmed by Quintella Reichert [phone]) on 07/06/2018 6:57:14 PM   Radiology Dg Chest 2 View  Result Date: 07/06/2018 CLINICAL DATA:  Chest pain. EXAM: CHEST - 2 VIEW COMPARISON:  Radiographs of December 26, 2010. FINDINGS: The heart size and mediastinal contours are within normal limits. Both lungs are clear. No pneumothorax or pleural effusion is noted. The visualized skeletal structures are unremarkable. IMPRESSION: No active cardiopulmonary disease. Electronically Signed   By: Marijo Conception, M.D.   On: 07/06/2018 16:22    Procedures Procedures (including  critical care time) CRITICAL CARE Performed by: Quintella Reichert   Total critical care time: 35 minutes  Critical care time was exclusive of separately billable procedures and treating other patients.  Critical care was necessary to treat or prevent imminent or life-threatening deterioration.  Critical care was time spent personally by me on the following activities: development of treatment plan with patient and/or surrogate as well as nursing, discussions with consultants, evaluation of patient's response to treatment, examination of patient, obtaining history from patient or surrogate, ordering and performing treatments and interventions, ordering and review of laboratory studies, ordering and review of radiographic studies, pulse oximetry and re-evaluation of patient's condition.  Medications Ordered in ED Medications  LORazepam (ATIVAN) injection 0.25 mg (0.25 mg Intravenous Given 07/06/18 1619)  aspirin chewable tablet 324 mg (324 mg Oral Given 07/06/18 1619)  metoprolol tartrate (LOPRESSOR) injection 2.5 mg (2.5 mg Intravenous Given 07/06/18 1625)     Initial Impression / Assessment and Plan / ED Course  I have reviewed the triage vital signs and the nursing notes.  Pertinent labs & imaging results that were available during my care of the patient were reviewed by me and considered in my medical decision making (see chart for details).  Clinical Course as of Jul 06 2349  Wed Jul 06, 2018  1605 Cardiology consulted   [ER]    Clinical Course User Index [ER] Quintella Reichert, MD    CHA2DS2/VAS Stroke Risk Points  Current as of 32 minutes ago     4 >= 2 Points: High Risk  1 - 1.99 Points: Medium Risk  0 Points: Low Risk    This is the only CHA2DS2/VAS Stroke Risk Points available for the past  year.:  Last Change: N/A     Details    This score determines the patient's risk of having a stroke if the  patient has atrial fibrillation.       Points Metrics  1 Has Congestive  Heart Failure:  Yes    Current as of 32 minutes ago  0 Has Vascular Disease:  No    Current as of 32 minutes ago  1 Has Hypertension:  Yes    Current as of 32 minutes ago  1 Age:  85    Current as of 32 minutes ago  0 Has Diabetes:  No    Current as of 32 minutes ago  0  Had Stroke:  No  Had TIA:  No  Had thromboembolism:  No    Current as of 32 minutes ago  1 Female:  Yes    Current as of 32 minutes ago           Patient with history of atrial fibrillation on chronic antiarrhythmic therapy as well as anticoagulation with eliquis here for evaluation of palpitations as well as chest pain. EKG demonstrates a fib with RVR, no ischemic changes. Initial troponin is mildly elevated. She was treated with aspirin, metoprolol bolus for rate control and cardiology was consulted.  Dr. Lovena Le with Cardiology evaluated the patient in the emergency department with plan to provide Toprol-XL and recheck. Patient did convert to normal sinus rhythm and resolution of her symptoms following administration of the Toprol. Plan to discharge home with outpatient cardiology follow-up as well as return precautions.  Troponin is mildly elevated, feel this is likely secondary to rapid ventricular response. Presentation is not consistent with ACS, PE, acute CHF. Final Clinical Impressions(s) / ED Diagnoses   Final diagnoses:  Atrial fibrillation with RVR [facility])    ED Discharge Orders    None       Quintella Reichert, MD 07/06/18 2352    Quintella Reichert, MD 07/07/18 0000

## 2018-07-06 NOTE — Discharge Instructions (Signed)
Continue your current medications as directed.  Call the heart center/atrial fibrillation clinic tomorrow if you feel like you are back in atrial fibrillation.

## 2018-07-06 NOTE — ED Provider Notes (Signed)
Patient placed in Quick Look pathway, seen and evaluated   Chief Complaint: chest pain  HPI:   Maria Arnold is a 72 y.o. female who presents to the ED with chest pain that started approximately 1 pm. The pain is located on the left side of the chest and radiates to the left arm. Patient denies n/v. The pain is not associated with deep breath or movement. Patient reports she thought at first it was indigestion but it continued.   ROS: C/V: chest pain  Physical Exam:  BP (!) 176/99 (BP Location: Right Arm)   Pulse (!) 147   Temp (!) 97.5 F (36.4 C) (Oral)   Resp 18   SpO2 99%    Gen: No distress  Neuro: Awake and Alert  Skin: Warm and dry  Heart: tachycardia, A-Fib on EKG  Lungs: no wheezing or rales     Initiation of care has begun. The patient has been counseled on the process, plan, and necessity for staying for the completion/evaluation, and the remainder of the medical screening examination    Ashley Murrain, NP 07/06/18 1510    Quintella Reichert, MD 07/08/18 1253

## 2018-07-06 NOTE — Consult Note (Signed)
Cardiology Consultation:   Patient ID: Maria Arnold; [id number]; [birth year]   Admit date: 07/06/2018 Date of Consult: 07/06/2018  Primary Care Provider: Vickii Penna, MD Primary Cardiologist: No primary care provider on file. Dr. Martyn Malay Primary Electrophysiologist:  Dr. Greggory Brandy   Patient Profile:   Maria Arnold is a 72 y.o. female with a hx of atrial fib and HTN and LV dysfunction who is being seen today for the evaluation of atrial fib with an RVR at the request of ER MD.  History of Present Illness:   Maria Arnold has a long h/o atrial fib and 2 ablation. She has been on dofetilide and notes that she goes in and out of atrial fib. She admits to drinking 2 drinks a day and drinks coffee. She denies missing any of her meds although she is sometimes late taking her dofetilide. She awoke this morning and noted that she was out of rhythm and came to the ED where she was in atrial fib with an RVR. She was having chest pain with her rapid rates. She admits to being very anxious over her husbands health.   Past Medical History:  Diagnosis Date  . Asthma   . Beta-blocker intolerance   . Foot pain    From bone spur  . History of narrow angle glaucoma   . HTN (hypertension)   . NICM (nonischemic cardiomyopathy) ([facility])    a. EF 30-35% in 2012 -> LHC (2/12): No coronary disease, Cardiomyopathy felt either tachycardia-mediated or Takotsubo (emotional shock the week before hospitalization). b. f/u echo 05/2011 with EF 55-60%, normal RV, normal valves.   . Paroxysmal atrial fibrillation (HCC)    a. AF ablation x 2. b. Previously on Tikosyn.    Past Surgical History:  Procedure Laterality Date  . ATRIAL FIBRILLATION ABLATION N/A 09/22/2012   Procedure: ATRIAL FIBRILLATION ABLATION;  Surgeon: [name], MD;  Location: [facility] CATH LAB;  Service: Cardiovascular;  Laterality: N/A;  . ATRIAL FIBRILLATION ABLATION  02/12/2015  . ATRIAL FIBRILLATION ABLATION N/A 02/12/2015   Procedure: ATRIAL FIBRILLATION  ABLATION;  Surgeon: [name], MD;  Location: [facility] CATH LAB;  Service: Cardiovascular;  Laterality: N/A;  . CARDIAC CATHETERIZATION  12/2010  . CARDIOVERSION N/A 07/17/2016   Procedure: CARDIOVERSION;  Surgeon: Thayer Headings, MD;  Location: [facility] ENDOSCOPY;  Service: Cardiovascular;  Laterality: N/A;  . DILATION AND CURETTAGE OF UTERUS    . LAPAROSCOPIC ABDOMINAL EXPLORATION  1980's  . NASAL SINUS SURGERY  1980's  . TEE WITHOUT CARDIOVERSION  09/21/2012   Procedure: TRANSESOPHAGEAL ECHOCARDIOGRAM (TEE);  Surgeon: Thayer Headings, MD;  Location: [place];  Service: Cardiovascular;  Laterality: N/A;  . TEE WITHOUT CARDIOVERSION N/A 02/12/2015   Procedure: TRANSESOPHAGEAL ECHOCARDIOGRAM (TEE);  Surgeon: Josue Hector, MD;  Location: [facility] ENDOSCOPY;  Service: Cardiovascular;  Laterality: N/A;  . TONSILLECTOMY AND ADENOIDECTOMY  1953     Home Medications:  Prior to Admission medications   Medication Sig Start Date End Date Taking? Authorizing Provider  albuterol (PROVENTIL HFA;VENTOLIN HFA) 108 (90 Base) MCG/ACT inhaler Inhale 2 puffs into the lungs every 6 (six) hours as needed for wheezing or shortness of breath. 01/26/18  Yes Larey Dresser, MD  albuterol (PROVENTIL) (2.5 MG/3ML) 0.083% nebulizer solution Take 3 mLs (2.5 mg total) by nebulization every 6 (six) hours as needed for wheezing or shortness of breath. 02/01/18  Yes Larey Dresser, MD  amLODipine (NORVASC) 2.5 MG tablet Take 1 tablet (2.5 mg total) by mouth daily. 01/18/18  Yes Larey Dresser, MD  apixaban (ELIQUIS) 5 MG TABS tablet Take 1 tablet (5 mg total) by mouth 2 (two) times daily. 01/18/18  Yes Larey Dresser, MD  carboxymethylcellulose (REFRESH TEARS) 0.5 % SOLN Apply 1-2 drops to eye 3 (three) times daily as needed (dry eyes).   Yes [provider]  dofetilide (TIKOSYN) 500 MCG capsule Take 1 capsule (500 mcg total) by mouth 2 (two) times daily. 01/18/18  Yes Larey Dresser, MD  valsartan (DIOVAN) 80 MG tablet  Take 1 tablet (80 mg total) by mouth 2 (two) times daily. 02/23/18  Yes Larey Dresser, MD    Inpatient Medications: Scheduled Meds: . metoprolol succinate  50 mg Oral Daily   Continuous Infusions:  PRN Meds:   Allergies:    Allergies  Allergen Reactions  . Lisinopril Cough    Social History:   Social History   Socioeconomic History  . Marital status: Married    Spouse name: Not on file  . Number of children: 2  . Years of education: Not on file  . Highest education level: Not on file  Occupational History    Employer: OTHER  Social Needs  . Financial resource strain: Not on file  . Food insecurity:    Worry: Not on file    Inability: Not on file  . Transportation needs:    Medical: Not on file    Non-medical: Not on file  Tobacco Use  . Smoking status: Former Smoker    Years: 11.00    Types: Cigarettes  . Smokeless tobacco: Never Used  . Tobacco comment: "quit smoking in 1976"  Substance and Sexual Activity  . Alcohol use: Yes    Alcohol/week: 7.0 standard drinks    Types: 7 Glasses of wine per week  . Drug use: No  . Sexual activity: Yes  Lifestyle  . Physical activity:    Days per week: Not on file    Minutes per session: Not on file  . Stress: Not on file  Relationships  . Social connections:    Talks on phone: Not on file    Gets together: Not on file    Attends religious service: Not on file    Active member of club or organization: Not on file    Attends meetings of clubs or organizations: Not on file    Relationship status: Not on file  . Intimate partner violence:    Fear of current or ex partner: Not on file    Emotionally abused: Not on file    Physically abused: Not on file    Forced sexual activity: Not on file  Other Topics Concern  . Not on file  Social History Narrative   Lives in [place] with husband.  2 children, 5 grandchildren.  Nonsmoker, occasional ETOH.     Family History:    Family History  Problem Relation Age of  Onset  . Prostate cancer Father   . Skin cancer Father   . Emphysema Mother   . Coronary artery disease Neg Hx        Premature     ROS:  Please see the history of present illness.   All other ROS reviewed and negative.     Physical Exam/Data:   Vitals:   07/06/18 1511 07/06/18 1541 07/06/18 1600 07/06/18 1705  BP:  (!) 155/114 (!) 142/93 (!) 135/105  Pulse:  (!) 126 (!) 121 (!) 110  Resp:  19 18 19   Temp:  TempSrc:      SpO2:  99% 98% 97%  Weight: 63 kg     Height: 5\' 2"  (1.575 m)      No intake or output data in the 24 hours ending 07/06/18 1757 Filed Weights   07/06/18 1511  Weight: 63 kg   Body mass index is 25.42 kg/m.  General:  Well nourished, well developed, in no acute distress HEENT: normal Lymph: no adenopathy Neck: 7 cm JVD Endocrine:  No thryomegaly Vascular: No carotid bruits; FA pulses 2+ bilaterally without bruits  Cardiac:  normal S1, S2; IRIR; no murmur  Lungs:  clear to auscultation bilaterally, no wheezing, rhonchi or rales  Abd: soft, nontender, no hepatomegaly  Ext: no edema Musculoskeletal:  No deformities, BUE and BLE strength normal and equal Skin: warm and dry  Neuro:  CNs 2-12 intact, no focal abnormalities noted Psych:  Normal affect   EKG:  The EKG was personally reviewed and demonstrates:  Atrial fib with a RVR Telemetry:  Telemetry was personally reviewed and demonstrates:  Atrial fib with a RVR  Relevant CV Studies: none  Laboratory Data:  Chemistry Recent Labs  Lab 07/06/18 1502  NA 137  K 4.3  CL 101  CO2 25  GLUCOSE 199*  BUN 10  CREATININE 0.54  CALCIUM 9.0  GFRNONAA >60  GFRAA >60  ANIONGAP 11    No results for input(s): PROT, ALBUMIN, AST, ALT, ALKPHOS, BILITOT in the last 168 hours. Hematology Recent Labs  Lab 07/06/18 1502  WBC 7.8  RBC 4.03  HGB 14.3  HCT 42.6  MCV 105.7*  MCH 35.5*  MCHC 33.6  RDW 12.0  PLT 249   Cardiac EnzymesNo results for input(s): TROPONINI in the last 168 hours.    Recent Labs  Lab 07/06/18 1544  TROPIPOC 0.10*    BNPNo results for input(s): BNP, PROBNP in the last 168 hours.  DDimer No results for input(s): DDIMER in the last 168 hours.  Radiology/Studies:  Dg Chest 2 View  Result Date: 07/06/2018 CLINICAL DATA:  Chest pain. EXAM: CHEST - 2 VIEW COMPARISON:  Radiographs of December 26, 2010. FINDINGS: The heart size and mediastinal contours are within normal limits. Both lungs are clear. No pneumothorax or pleural effusion is noted. The visualized skeletal structures are unremarkable. IMPRESSION: No active cardiopulmonary disease. Electronically Signed   By: Marijo Conception, M.D.   On: 07/06/2018 16:22    Assessment and Plan:   1. Atrial fib with a RVR - she has this off and on. Today her HR was increased and associated with symptoms. I have recommended she be given toprol xl 50 mg tonight, go home and take her meds including the dofetilide, and not eat after 0700 in the morning. If she does not revert with dofetilide then we will have her come back for DCCV tomorrow afternoon.  2. Chest pain - she has no known CAD by cath, and her troponin is ok.  3. Chronic systolic and diastolic heart failure - her symptoms appear well controlled when she is in NSR, but not as well controlled in atrial fib.  4. ETOH - she drinks 2-4 glasses of wine a night. I have asked her to drink no more than a single glass a day.    For questions or updates, please contact [place] Please consult [email or web address] for contact info under Cardiology/STEMI.   Signed, Cristopher Peru, MD  07/06/2018 5:57 PM

## 2018-07-06 NOTE — ED Triage Notes (Signed)
Reports aching L sided chest pain since 1pm that radiates to L arm.  Denies sob, n/v.  History of afib.

## 2018-07-07 ENCOUNTER — Telehealth (HOSPITAL_COMMUNITY): Payer: Self-pay

## 2018-07-07 MED ORDER — AMOXICILLIN-POT CLAVULANATE 875-125 MG PO TABS: 1.0000 | ORAL_TABLET | Freq: Two times a day (BID) | ORAL | 0 refills | Status: DC

## 2018-07-07 NOTE — Telephone Encounter (Signed)
Pt called to report she went to ER on 08/28 thinking she was having a myocardia farction. Pt learned she was in Afib. Pt states she has a bad cold, possible bronchitis. Augmentin 875 mg BID x10 days was sent to pharmacy.Follow up with Afib clinic.

## 2018-07-12 ENCOUNTER — Telehealth (HOSPITAL_COMMUNITY): Payer: Self-pay | Admitting: *Deleted

## 2018-07-12 NOTE — Telephone Encounter (Signed)
-----   Message from Vanice Sarah, [place] sent at 07/07/2018 11:16 AM EDT ----- Regarding: appt Estelle Skibicki, can you please make pt an appt with Afib clinic? Thanks.

## 2018-07-12 NOTE — Telephone Encounter (Signed)
LM to make appt.

## 2018-07-18 ENCOUNTER — Other Ambulatory Visit (HOSPITAL_COMMUNITY): Payer: Self-pay

## 2018-07-18 MED ORDER — AMOXICILLIN-POT CLAVULANATE 875-125 MG PO TABS: 1.0000 | ORAL_TABLET | Freq: Two times a day (BID) | ORAL | 0 refills | Status: DC

## 2018-11-22 ENCOUNTER — Other Ambulatory Visit: Payer: Self-pay | Admitting: Cardiology

## 2018-11-25 ENCOUNTER — Other Ambulatory Visit (HOSPITAL_COMMUNITY): Payer: Self-pay

## 2018-11-25 ENCOUNTER — Telehealth (HOSPITAL_COMMUNITY): Payer: Self-pay

## 2018-11-25 MED ORDER — APIXABAN 5 MG PO TABS: 5.0000 mg | ORAL_TABLET | Freq: Two times a day (BID) | ORAL | 0 refills | Status: DC

## 2018-11-25 MED ORDER — VALSARTAN 80 MG PO TABS: 80.0000 mg | ORAL_TABLET | Freq: Two times a day (BID) | ORAL | 0 refills | Status: DC

## 2018-11-25 MED ORDER — AMLODIPINE BESYLATE 2.5 MG PO TABS: 2.5000 mg | ORAL_TABLET | Freq: Every day | ORAL | 0 refills | Status: DC

## 2018-11-25 NOTE — Telephone Encounter (Signed)
Medication Samples have been provided to the patient.  Drug name: Eliquis       Strength: 2.5 mg        Qty: 4  LOT: [id number]  Exp.Date: 07/20  Dosing instructions: Take 1 tablet (5 mg total) by mouth 2 (two) times daily.  The patient has been instructed regarding the correct time, dose, and frequency of taking this medication, including desired effects and most common side effects.   Vanice Sarah 11:22 AM 11/25/2018

## 2018-12-30 ENCOUNTER — Telehealth (HOSPITAL_COMMUNITY): Payer: Self-pay

## 2018-12-30 MED ORDER — AMOXICILLIN-POT CLAVULANATE 875-125 MG PO TABS: 1.0000 | ORAL_TABLET | Freq: Two times a day (BID) | ORAL | 0 refills | Status: AC

## 2018-12-30 NOTE — Telephone Encounter (Signed)
Augmentin sent to pharmacy per Dr. Aundra Dubin for 7 days. Pt made aware of same. Appreciative.

## 2019-01-19 ENCOUNTER — Other Ambulatory Visit (HOSPITAL_COMMUNITY): Payer: Self-pay

## 2019-01-19 MED ORDER — AMLODIPINE BESYLATE 2.5 MG PO TABS: 2.5000 mg | ORAL_TABLET | Freq: Every day | ORAL | 0 refills | Status: DC

## 2019-01-19 MED ORDER — VALSARTAN 80 MG PO TABS: 80.0000 mg | ORAL_TABLET | Freq: Two times a day (BID) | ORAL | 0 refills | Status: DC

## 2019-01-19 MED ORDER — APIXABAN 5 MG PO TABS: 5.0000 mg | ORAL_TABLET | Freq: Two times a day (BID) | ORAL | 0 refills | Status: DC

## 2019-02-06 ENCOUNTER — Encounter (HOSPITAL_COMMUNITY): Payer: Medicare Other | Admitting: Cardiology

## 2019-02-08 ENCOUNTER — Other Ambulatory Visit (HOSPITAL_COMMUNITY): Payer: Self-pay | Admitting: Cardiology

## 2019-02-10 ENCOUNTER — Other Ambulatory Visit (HOSPITAL_COMMUNITY): Payer: Self-pay | Admitting: Cardiology

## 2019-02-21 ENCOUNTER — Other Ambulatory Visit (HOSPITAL_COMMUNITY): Payer: Self-pay | Admitting: Cardiology

## 2019-05-19 ENCOUNTER — Other Ambulatory Visit (HOSPITAL_COMMUNITY): Payer: Self-pay | Admitting: Cardiology

## 2019-05-19 ENCOUNTER — Telehealth (HOSPITAL_COMMUNITY): Payer: Self-pay | Admitting: Cardiology

## 2019-05-19 ENCOUNTER — Other Ambulatory Visit (HOSPITAL_COMMUNITY): Payer: Self-pay

## 2019-05-19 MED ORDER — VALSARTAN 80 MG PO TABS: 80.0000 mg | ORAL_TABLET | Freq: Two times a day (BID) | ORAL | 0 refills | Status: DC

## 2019-05-19 MED ORDER — APIXABAN 5 MG PO TABS: 5.0000 mg | ORAL_TABLET | Freq: Two times a day (BID) | ORAL | 0 refills | Status: DC

## 2019-05-19 MED ORDER — AMLODIPINE BESYLATE 2.5 MG PO TABS: 2.5000 mg | ORAL_TABLET | Freq: Every day | ORAL | 0 refills | Status: DC

## 2019-05-19 NOTE — Telephone Encounter (Signed)
-----   Message from Juluis Mire, RN sent at 05/19/2019  1:14 PM EDT ----- Regarding: voicemail [facility] left message on afib machine - stated needed [facility]ills of valsartan & [facility]iquis [facility] [place] - pt of dr Aundra Dubin [phone]  Thanks Marzetta Board

## 2019-05-19 NOTE — Telephone Encounter (Signed)
Spoke to pt about getting refills and about scheduling a follow up with Dr. Aundra Dubin. Follow up appointment long over due. Spoke with pt about covid 19 fears and reassured her that we are doing all that we can in order to keep things as safe and clean as possible. Pt willing to come in. appt made

## 2019-05-19 NOTE — Telephone Encounter (Signed)
Refills addressed  Last refill w/o OV  Message to schedulers for follow up

## 2019-05-29 ENCOUNTER — Other Ambulatory Visit (HOSPITAL_COMMUNITY): Payer: Self-pay | Admitting: Cardiology

## 2019-07-06 ENCOUNTER — Ambulatory Visit (HOSPITAL_COMMUNITY)
Admission: RE | Admit: 2019-07-06 | Discharge: 2019-07-06 | Disposition: A | Discharge status: Home / Self Care | Payer: Medicare Other | Source: Ambulatory Visit | Attending: Cardiology | Admitting: Cardiology

## 2019-07-06 ENCOUNTER — Encounter (HOSPITAL_COMMUNITY): Payer: Self-pay | Admitting: Cardiology

## 2019-07-06 ENCOUNTER — Other Ambulatory Visit: Payer: Self-pay

## 2019-07-06 DIAGNOSIS — I5181 Takotsubo syndrome: Secondary | ICD-10-CM

## 2019-07-06 DIAGNOSIS — I10 Essential (primary) hypertension: Secondary | ICD-10-CM

## 2019-07-06 DIAGNOSIS — I480 Paroxysmal atrial fibrillation: Secondary | ICD-10-CM

## 2019-07-06 NOTE — Patient Instructions (Signed)
It was great to speak with you today! No medication changes are needed at this time.  Please return to the office on 07/11/2019 for a nurse visit as we discussed for labs, EKG and a Zio patch.  You have been referred to [facility] Electrophysiology- Dr Stark Klein  Address: [address] [place], [place] [address] Phone: [phone]  Your provider has recommended that  you wear a Zio Patch for 14 days.  This monitor will record your heart rhythm for our review.  IF you have any symptoms while wearing the monitor please press the button.  If you have any issues with the patch or you notice a red or orange light on it please call the company at [phone].  Once you remove the patch please mail it back to the company as soon as possible so we can get the results.   Your physician recommends that you schedule a follow-up appointment in: 4 months with Dr Aundra Dubin

## 2019-07-08 NOTE — Progress Notes (Signed)
Heart Failure TeleHealth Note  Due to national recommendations of social distancing due to [place] 19, Audio/video telehealth visit is felt to be most appropriate for this patient at this time.  See MyChart message from today for patient consent regarding telehealth for [facility].  Date:  07/08/2019   ID:  Maria Arnold, DOB 1946/08/22, MRN [id number]  Location: Home  Provider location: [place] [facility] Failure Type of Visit: Established patient   PCP:  Vickii Penna, MD  Cardiologist: Dr. Aundra Dubin Chief Complaint: Palpitations   History of Present Illness: Maria Arnold is a 73 y.o. female who presents via audio/video conferencing for a telehealth visit today.     she denies symptoms worrisome for COVID 78.   73 y.o. 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  For about a week prior to initial admission in 2/12, she had had congestion and shortness of breath. She thought she had a bad cold. She was seen by her PCP and ECG showed atrial fibrillation with RVR. She was admitted to [facility] for evaluation. CXR did not show PNA. She had wheezing consistent with a prior asthma diagnosis. She was then noted by echo to have EF 30-35% with peri-apical severe hypokinesis. LHC was done, there was no angiographic coronary disease. 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 She had suffered an emotional shock about a week prior to admission when her daughter-in-law had a miscarriage.   She underwent TEE-guided cardioversion, but this failed to convert her to NSR. She was then started on Tikosyn, which did convert her to NSR but her QTc prolonged in the setting of using moxifloxacin for acute bronchitis treatment. Moxifloxacin was stopped and the dofetilide dose was cut back to 250 micrograms two times a day. At this dose, QTc was normal.  Repeat echo done in 7/12 showed EF  improved to 55-60% with normal RV and valves.  She underwent atrial fibrillation ablation in 11/13.  In 4/14, we talked about stopping Tikosyn and she got very nervous.  She actually went into atrial fibrillation in the office so I did not stop Tikosyn.   She had a re-do atrial fibrillation ablation in 4/16.   She saw Dr Rayann Heman in 3/17 and stopped Tikosyn and bisoprolol.  After stopping bisoprolol, she felt much better, no further wheezing or dyspnea.  She went back into atrial fibrillation and was reloaded on Tikosyn and cardioverted in 9/17.   She later saw Dr Lehman Prom at [facility] and was planning on undergoing a 3rd ablation but he left [facility].    She has been somewhat sporadic about followup, which has been challenging given Tikosyn use.   She is stable clinically.  Still feels periodic atrial fibrillation.  She gets anxious and feels palpitations while in atrial fibrillation.  Between episodes, no significant exertional dyspnea and no chest pain.  No syncopal or presyncopal episodes.  She is taking all her medications as ordered.   Labs (2/12): K 4.8, creatinine 0.67  Labs (3/12): K 4.2, creatinine 0.5  Labs (11/13): K 3.8, creatinine 0.5 Labs (4/14): K 4.1, creatinine 0.7, HCT 44.7 Labs (12/14): K 4.1, creatinine 0.6, HCT 42, TSH normal, Mg 2.1 Labs (8/15): K 4.2, creatinine 0.5, HCT 41, thyroid indices normal Labs (4/16): K 3.8, creatinine 0.46, Mg 1.8 Labs (9/17): K 5.1, creatinine 0.5, Mg 2 Labs (2/18): LDL 101, HDL 72, hgb 13.2, K 4.6, creatinine 0.49, Mg 1.8 Labs (  8/19): K 4.3, creatinine 0.54  Allergies (verified):  No Known Drug Allergies   Past Medical History:  1. Paroxysmal atrial fibrillation with rapid ventricular response. She is on coumadin and dofetilide. She has had some breakthrough atrial fib on dofetilide. Atrial fibrillation ablation in 11/13 and redo in 4/16. Dofetilide stopped 3/17, had breakthrough atrial fibrillation after this. Reloaded with Tikosyn in 9/17 and  cardioverted to NSR.  2. Cardiomyopathy: Nonischemic. LHC (2/12): No coronary disease, EF 30-35% with periapical severe hypokinesis. Echo (2/12): EF 30-35%, periapical severe hypokinesis, normal RV, mild to moderate MR. 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  Repeat echo in 7/12 showed EF 55-60%, mild biatrial enlargement, normal RV, normal valves.  - TEE (4/16) with EF 55-60%. - Echo (6/17) with EF 55-60%.  3. Asthma: Beta blocker intolerance.  4. HTN  5. Foot pain from bone spur  6. Low back pain  7. Narrow angle glaucoma 8. Nasal bone fracture s/p ENT surgery   Current Outpatient Medications  Medication Sig Dispense Refill  . albuterol (PROVENTIL HFA;VENTOLIN HFA) 108 (90 Base) MCG/ACT inhaler TAKE 2 PUFFS BY MOUTH EVERY 6 HOURS AS NEEDED FOR WHEEZE OR SHORTNESS OF BREATH 8.5 Inhaler 2  . albuterol (PROVENTIL) (2.5 MG/3ML) 0.083% nebulizer solution Take 3 mLs (2.5 mg total) by nebulization every 6 (six) hours as needed for wheezing or shortness of breath. 75 mL 12  . amLODipine (NORVASC) 2.5 MG tablet Take 1 tablet (2.5 mg total) by mouth daily. 180 tablet 0  . carboxymethylcellulose (REFRESH TEARS) 0.5 % SOLN Apply 1-2 drops to eye 3 (three) times daily as needed (dry eyes).    . dofetilide (TIKOSYN) 500 MCG capsule Take 1 capsule (500 mcg total) by mouth 2 (two) times daily. 180 capsule 3  . ELIQUIS 5 MG TABS tablet TAKE ONE TABLET BY MOUTH TWICE A DAY 180 tablet 0  . valsartan (DIOVAN) 80 MG tablet Take 1 tablet (80 mg total) by mouth 2 (two) times daily. 180 tablet 0   No current facility-administered medications for this encounter.     Allergies:   Lisinopril   Social History:  The patient  reports that she has quit smoking. Her smoking use included cigarettes. She quit after 11.00 years of use. She has never used smokeless tobacco. She reports current alcohol use of about 7.0 standard drinks of  alcohol per week. She reports that she does not use drugs.   Family History:  The patient's family history includes Emphysema in her mother; Prostate cancer in her father; Skin cancer in her father.   ROS:  Please see the history of present illness.   All other systems are personally reviewed and negative.   Exam:  (Video/Tele Health Call; Exam is subjective and or/visual.) General:  Speaks in full sentences. No resp difficulty. Lungs: Normal respiratory effort with conversation.  Abdomen: Non-distended per patient report Extremities: Pt denies edema. Neuro: Alert & oriented x 3.   Recent Labs: No results found for requested labs within last 8760 hours.  Personally reviewed   Wt Readings from Last 3 Encounters:  07/06/18 63 kg (139 lb)  08/23/17 68.6 kg (151 lb 4 oz)  12/28/16 69.2 kg (152 lb 8 oz)      ASSESSMENT AND PLAN:  1. Takotsubo cardiomyopathy: Resolved, EF normal on last echo in 6/17.  - She will remain on valsartan 80 mg bid. She will need to come by the office to get BMET.  -  She has stopped bisoprolol.  I think that she may develop dyspnea/wheezing/respiratory decompensation on beta blockers and would avoid (had trouble with Coreg then bisoprolol).   2. Atrial fibrillation: Paroxysmal.  She has had afib ablation x 2. She stopped Tikosyn in 3/17 but restarted in 9/17 and had DCCV to NSR.  She has frequent but short-lived symptomatic breakthrough episodes that are distressing. She had planned a 3rd atrial fibrillation ablation with Dr. Clyda Hurdle at [facility] but he has moved.   - Continue Eliquis. She will need to come by the office for a CBC.  - Continue dofetilide.  She needs to come by the office for ECG, BMET, Mg.  My nurse will arrange.   - She is still interested in a 3rd ablation given ongoing symptoms.  She would like to go back to [facility], I will make a referral to Dr. Willis Modena.   - 2 week Zio patch to quantify atrial fibrillation burden.  3. HTN: Continue valsartan and add  amlodipine 2.5 mg daily. She will continue to follow BP readings at home and will get them to me to review.     Recommended follow-up:  4 months  Today, I have spent 20 minutes with the patient with telehealth technology discussing the above issues .    Signed, Loralie Champagne, MD  07/08/2019 11:37 AM  [facility] [address] [place] [address] [phone] (office) [phone] (fax)

## 2019-07-10 ENCOUNTER — Telehealth (HOSPITAL_COMMUNITY): Payer: Self-pay | Admitting: Cardiology

## 2019-07-10 NOTE — Telephone Encounter (Signed)
  Referral to [facility] faxed to [phone]

## 2019-07-11 ENCOUNTER — Encounter (HOSPITAL_COMMUNITY): Payer: Medicare Other

## 2019-07-12 ENCOUNTER — Other Ambulatory Visit (HOSPITAL_COMMUNITY): Payer: Medicare Other

## 2019-07-13 ENCOUNTER — Encounter (HOSPITAL_COMMUNITY): Payer: Medicare Other

## 2019-07-18 ENCOUNTER — Telehealth (HOSPITAL_COMMUNITY): Payer: Self-pay

## 2019-07-18 NOTE — Telephone Encounter (Signed)
Recv'd v/m from PT to reschedule a previous appt. Attempted to contact PT, no answer...left v/m for PT to return.

## 2019-08-22 ENCOUNTER — Encounter (HOSPITAL_COMMUNITY): Payer: Medicare Other

## 2019-08-29 ENCOUNTER — Encounter (HOSPITAL_COMMUNITY): Payer: Medicare Other

## 2019-08-30 ENCOUNTER — Telehealth (HOSPITAL_COMMUNITY): Payer: Self-pay | Admitting: Licensed Clinical Social Worker

## 2019-08-30 ENCOUNTER — Encounter (HOSPITAL_COMMUNITY): Payer: Medicare Other

## 2019-08-30 NOTE — Telephone Encounter (Signed)
CSW consulted to reach out to pt regarding multiple cancelations for lab appts.  CSW spoke with pt regarding recent cancelations- pt states these are all due to appt conflicts for her husband who is dealing with cancer at this time.  They had to cancel appt today because patient had just been informed that he was accepted into a trial and today was the meeting.  Pt confirms her appt time and date for next week and that there are no current conflicting appts.  CSW informed pt that because it was a lab appt we could be flexible with the time so if her husband ended up with a conflicting appt then she could call and fix another time that day to come in.   Pt also reports being nervous coming in due to [place] informed pt of the safety measures in place  CSW will continue to follow and assist as needed  Jorge Ny, [facility] Desk#: [phone] Cell#: [phone]

## 2019-09-06 ENCOUNTER — Encounter (HOSPITAL_COMMUNITY): Payer: Medicare Other

## 2019-10-19 ENCOUNTER — Telehealth (HOSPITAL_COMMUNITY): Payer: Self-pay | Admitting: *Deleted

## 2019-10-19 NOTE — Telephone Encounter (Signed)
Pt left VM earlier this afternoon stating she had eye procedure at [facility] yesterday and then went to sleep when she got home and missed her Tikosyn dose, she took it when she woke up during the night but is off schedule with it and wants to see what she should do next.    Per Dr Aundra Dubin just take it this evening as she normally would.  Attempted to call pt twice with no answer, left detailed message on her ID VM with instructions per Dr Aundra Dubin, call back for further questions.

## 2019-10-24 ENCOUNTER — Encounter (HOSPITAL_COMMUNITY): Payer: Medicare Other

## 2019-10-30 ENCOUNTER — Encounter (HOSPITAL_COMMUNITY): Payer: Medicare Other

## 2019-10-31 ENCOUNTER — Encounter (HOSPITAL_COMMUNITY): Payer: Medicare Other

## 2019-11-06 ENCOUNTER — Encounter (HOSPITAL_COMMUNITY): Payer: Medicare Other

## 2019-11-27 ENCOUNTER — Other Ambulatory Visit (HOSPITAL_COMMUNITY): Payer: Self-pay | Admitting: Cardiology

## 2019-11-27 MED ORDER — VALSARTAN 80 MG PO TABS: 80.0000 mg | ORAL_TABLET | Freq: Two times a day (BID) | ORAL | 0 refills | Status: DC

## 2019-11-27 MED ORDER — APIXABAN 5 MG PO TABS: 5.0000 mg | ORAL_TABLET | Freq: Two times a day (BID) | ORAL | 0 refills | Status: DC

## 2019-11-27 NOTE — Telephone Encounter (Signed)
Patient called to report she travelled to sell her family home and forgot two scripts. Will need eliquis and valsartan until she returns home over the weekend [facility] may not fill as they paid for routine refills and she may have to pay cash price for medications. Patient voiced understanding and appreciative of assistance

## 2019-12-01 ENCOUNTER — Other Ambulatory Visit (HOSPITAL_COMMUNITY): Payer: Self-pay | Admitting: *Deleted

## 2019-12-01 MED ORDER — VALSARTAN 80 MG PO TABS: 80.0000 mg | ORAL_TABLET | Freq: Two times a day (BID) | ORAL | 0 refills | Status: DC

## 2019-12-01 MED ORDER — APIXABAN 5 MG PO TABS: 5.0000 mg | ORAL_TABLET | Freq: Two times a day (BID) | ORAL | 0 refills | Status: DC

## 2019-12-06 ENCOUNTER — Other Ambulatory Visit (HOSPITAL_COMMUNITY): Payer: Self-pay | Admitting: Cardiology

## 2019-12-06 MED ORDER — APIXABAN 5 MG PO TABS: 5.0000 mg | ORAL_TABLET | Freq: Two times a day (BID) | ORAL | 0 refills | Status: DC

## 2019-12-06 MED ORDER — DOFETILIDE 500 MCG PO CAPS: 500.0000 ug | ORAL_CAPSULE | Freq: Two times a day (BID) | ORAL | 1 refills | Status: DC

## 2019-12-06 MED ORDER — VALSARTAN 80 MG PO TABS: 80.0000 mg | ORAL_TABLET | Freq: Two times a day (BID) | ORAL | 0 refills | Status: DC

## 2019-12-06 MED ORDER — VALSARTAN 80 MG PO TABS: 80.0000 mg | ORAL_TABLET | Freq: Two times a day (BID) | ORAL | 1 refills | Status: DC

## 2019-12-06 MED ORDER — AMLODIPINE BESYLATE 2.5 MG PO TABS: 2.5000 mg | ORAL_TABLET | Freq: Every day | ORAL | 1 refills | Status: DC

## 2019-12-06 MED ORDER — APIXABAN 5 MG PO TABS: 5.0000 mg | ORAL_TABLET | Freq: Two times a day (BID) | ORAL | 1 refills | Status: DC

## 2019-12-06 NOTE — Telephone Encounter (Signed)
Pt requested enough medication for one week of valsartan and eliquis until mail order supply comes in. Patient also needed updated scripts for tykison, eliquis, valsartan, and norvasc sent to [facility]

## 2019-12-14 ENCOUNTER — Other Ambulatory Visit (HOSPITAL_COMMUNITY): Payer: Self-pay | Admitting: *Deleted

## 2019-12-14 MED ORDER — VALSARTAN 80 MG PO TABS: 80.0000 mg | ORAL_TABLET | Freq: Two times a day (BID) | ORAL | 0 refills | Status: DC

## 2019-12-14 MED ORDER — APIXABAN 5 MG PO TABS: 5.0000 mg | ORAL_TABLET | Freq: Two times a day (BID) | ORAL | 0 refills | Status: DC

## 2020-01-05 ENCOUNTER — Other Ambulatory Visit (HOSPITAL_COMMUNITY): Payer: Medicare Other

## 2020-03-18 ENCOUNTER — Other Ambulatory Visit (HOSPITAL_COMMUNITY): Payer: Self-pay | Admitting: Cardiology

## 2020-03-19 ENCOUNTER — Other Ambulatory Visit (HOSPITAL_COMMUNITY): Payer: Self-pay | Admitting: Cardiology

## 2020-06-11 ENCOUNTER — Other Ambulatory Visit (HOSPITAL_COMMUNITY): Payer: Self-pay | Admitting: Cardiology

## 2020-06-28 ENCOUNTER — Other Ambulatory Visit (HOSPITAL_COMMUNITY): Payer: Self-pay | Admitting: Cardiology

## 2020-07-01 ENCOUNTER — Encounter (HOSPITAL_COMMUNITY): Payer: Self-pay | Admitting: Cardiology

## 2020-07-01 ENCOUNTER — Other Ambulatory Visit: Payer: Self-pay

## 2020-07-01 ENCOUNTER — Ambulatory Visit (HOSPITAL_COMMUNITY)
Admission: RE | Admit: 2020-07-01 | Discharge: 2020-07-01 | Disposition: A | Discharge status: Home / Self Care | Payer: Medicare Other | Source: Ambulatory Visit | Attending: Cardiology | Admitting: Cardiology

## 2020-07-01 VITALS — BP 120/80 | HR 73 | Wt 141.0 lb

## 2020-07-01 DIAGNOSIS — E785 Hyperlipidemia, unspecified: Secondary | ICD-10-CM

## 2020-07-01 DIAGNOSIS — Z79899 Other long term (current) drug therapy: Secondary | ICD-10-CM | POA: Insufficient documentation

## 2020-07-01 DIAGNOSIS — Z888 Allergy status to other drugs, medicaments and biological substances status: Secondary | ICD-10-CM | POA: Diagnosis not present

## 2020-07-01 DIAGNOSIS — Z7901 Long term (current) use of anticoagulants: Secondary | ICD-10-CM | POA: Insufficient documentation

## 2020-07-01 DIAGNOSIS — I5022 Chronic systolic (congestive) heart failure: Secondary | ICD-10-CM | POA: Diagnosis not present

## 2020-07-01 DIAGNOSIS — Z8679 Personal history of other diseases of the circulatory system: Secondary | ICD-10-CM | POA: Diagnosis not present

## 2020-07-01 DIAGNOSIS — J45909 Unspecified asthma, uncomplicated: Secondary | ICD-10-CM | POA: Insufficient documentation

## 2020-07-01 DIAGNOSIS — I10 Essential (primary) hypertension: Secondary | ICD-10-CM | POA: Diagnosis not present

## 2020-07-01 DIAGNOSIS — Z87891 Personal history of nicotine dependence: Secondary | ICD-10-CM | POA: Diagnosis not present

## 2020-07-01 DIAGNOSIS — I5181 Takotsubo syndrome: Secondary | ICD-10-CM | POA: Diagnosis not present

## 2020-07-01 DIAGNOSIS — I429 Cardiomyopathy, unspecified: Secondary | ICD-10-CM | POA: Insufficient documentation

## 2020-07-01 DIAGNOSIS — I480 Paroxysmal atrial fibrillation: Secondary | ICD-10-CM | POA: Insufficient documentation

## 2020-07-01 LAB — MAGNESIUM: Magnesium: 2.1 mg/dL (ref 1.7–2.4)

## 2020-07-01 LAB — CBC
HCT: 37.7 % (ref 36.0–46.0)
Hemoglobin: 12.4 g/dL (ref 12.0–15.0)
MCH: 34.1 pg — ABNORMAL HIGH (ref 26.0–34.0)
MCHC: 32.9 g/dL (ref 30.0–36.0)
MCV: 103.6 fL — ABNORMAL HIGH (ref 80.0–100.0)
Platelets: 275 10*3/uL (ref 150–400)
RBC: 3.64 MIL/uL — ABNORMAL LOW (ref 3.87–5.11)
RDW: 12.6 % (ref 11.5–15.5)
WBC: 7.1 10*3/uL (ref 4.0–10.5)
nRBC: 0 % (ref 0.0–0.2)

## 2020-07-01 LAB — LIPID PANEL
Cholesterol: 165 mg/dL (ref 0–200)
HDL: 59 mg/dL (ref >40)
LDL Cholesterol: 97 mg/dL (ref 0–99)
Total CHOL/HDL Ratio: 2.8 RATIO
Triglycerides: 47 mg/dL (ref <150)
VLDL: 9 mg/dL (ref 0–40)

## 2020-07-01 NOTE — Progress Notes (Signed)
Date:  07/01/2020   ID:  [name], DOB 07-30-1946, MRN [id number]   Provider location: [facility] Type of Visit: Established patient   PCP:  Vickii Penna, MD  Cardiologist: Dr. Aundra Dubin   History of Present Illness: Maria Arnold is a 74 y.o. 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  For about a week prior to initial admission in 2/12, she had had congestion and shortness of breath. She thought she had a bad cold. She was seen by her PCP and ECG showed atrial fibrillation with RVR. She was admitted to [facility] for evaluation. CXR did not show PNA. She had wheezing consistent with a prior asthma diagnosis. She was then noted by echo to have EF 30-35% with peri-apical severe hypokinesis. LHC was done, there was no angiographic coronary disease. 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 She had suffered an emotional shock about a week prior to admission when her daughter-in-law had a miscarriage.   She underwent TEE-guided cardioversion, but this failed to convert her to NSR. She was then started on Tikosyn, which did convert her to NSR but her QTc prolonged in the setting of using moxifloxacin for acute bronchitis treatment. Moxifloxacin was stopped and the dofetilide dose was cut back to 250 micrograms two times a day. At this dose, QTc was normal.  Repeat echo done in 7/12 showed EF improved to 55-60% with normal RV and valves.  She underwent atrial fibrillation ablation in 11/13.  In 4/14, we talked about stopping Tikosyn and she got very nervous.  She actually went into atrial fibrillation in the office so I did not stop Tikosyn.   She had a re-do atrial fibrillation ablation in 4/16.   She saw Dr Rayann Heman in 3/17 and stopped Tikosyn and bisoprolol.  After stopping bisoprolol, she felt much better, no further wheezing or dyspnea.  She went  back into atrial fibrillation and was reloaded on Tikosyn and cardioverted in 9/17.   She later saw Dr Lehman Prom at [facility] and was planning on undergoing a 3rd ablation but he left [facility].    She has been somewhat sporadic about followup, which has been challenging given Tikosyn use.   She is stable clinically.  No recent palpitations.  She is in NSR today.  No recent episodes of bronchitis.  No chest pain or exertional dyspnea.  She walks for exercise.  Main complaint recently has been abdominal pain and urinary urgency.  She is scheduled for a colonoscopy soon.   Labs (2/12): K 4.8, creatinine 0.67  Labs (3/12): K 4.2, creatinine 0.5  Labs (11/13): K 3.8, creatinine 0.5 Labs (4/14): K 4.1, creatinine 0.7, HCT 44.7 Labs (12/14): K 4.1, creatinine 0.6, HCT 42, TSH normal, Mg 2.1 Labs (8/15): K 4.2, creatinine 0.5, HCT 41, thyroid indices normal Labs (4/16): K 3.8, creatinine 0.46, Mg 1.8 Labs (9/17): K 5.1, creatinine 0.5, Mg 2 Labs (2/18): LDL 101, HDL 72, hgb 13.2, K 4.6, creatinine 0.49, Mg 1.8 Labs (8/19): K 4.3, creatinine 0.54 Labs (8/21): K 4.3, creatinine 0.5, hgb 12.5  ECG (personally reviewed): NSR, QTc 444 msec.   Past Medical History:  1. Paroxysmal atrial fibrillation with rapid ventricular response. She is on coumadin and dofetilide. She has had some breakthrough atrial fib on dofetilide. Atrial fibrillation ablation in 11/13 and redo in 4/16. Dofetilide stopped 3/17, had breakthrough atrial fibrillation after this. Reloaded with Tikosyn  in 9/17 and cardioverted to NSR.  2. Cardiomyopathy: Nonischemic. LHC (2/12): No coronary disease, EF 30-35% with periapical severe hypokinesis. Echo (2/12): EF 30-35%, periapical severe hypokinesis, normal RV, mild to moderate MR. 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  Repeat echo in 7/12 showed EF 55-60%, mild biatrial enlargement, normal RV, normal valves.  -  TEE (4/16) with EF 55-60%. - Echo (6/17) with EF 55-60%.  3. Asthma: Beta blocker intolerance.  4. HTN  5. Foot pain from bone spur  6. Low back pain  7. Narrow angle glaucoma 8. Nasal bone fracture s/p ENT surgery   Current Outpatient Medications  Medication Sig Dispense Refill  . albuterol (PROVENTIL HFA;VENTOLIN HFA) 108 (90 Base) MCG/ACT inhaler TAKE 2 PUFFS BY MOUTH EVERY 6 HOURS AS NEEDED FOR WHEEZE OR SHORTNESS OF BREATH 8.5 Inhaler 2  . albuterol (PROVENTIL) (2.5 MG/3ML) 0.083% nebulizer solution Take 3 mLs (2.5 mg total) by nebulization every 6 (six) hours as needed for wheezing or shortness of breath. 75 mL 12  . amLODipine (NORVASC) 2.5 MG tablet Take 1 tablet (2.5 mg total) by mouth daily. 180 tablet 1  . carboxymethylcellulose (REFRESH TEARS) 0.5 % SOLN Apply 1-2 drops to eye 3 (three) times daily as needed (dry eyes).    . Magnesium 200 MG TABS Take by mouth.    . Multiple Vitamin (MULTI-VITAMIN) tablet Take 1 tablet by mouth daily.    Marland Kitchen TIKOSYN 500 MCG capsule TAKE ONE CAPSULE BY MOUTH TWICE A DAY 180 capsule 1  . valsartan (DIOVAN) 80 MG tablet Take 1 tablet (80 mg total) by mouth 2 (two) times daily. Last refill without office visit Please call [phone] 60 tablet 0  . vitamin B-12 (CYANOCOBALAMIN) 250 MCG tablet Take by mouth.    Marland Kitchen apixaban (ELIQUIS) 5 MG TABS tablet Take 1 tablet (5 mg total) by mouth 2 (two) times daily. 60 tablet 11   No current facility-administered medications for this encounter.    Allergies:   Lisinopril   Social History:  The patient  reports that she has quit smoking. Her smoking use included cigarettes. She quit after 11.00 years of use. She has never used smokeless tobacco. She reports current alcohol use of about 7.0 standard drinks of alcohol per week. She reports that she does not use drugs.   Family History:  The patient's family history includes Emphysema in her mother; Prostate cancer in her father; Skin cancer in her father.    ROS:  Please see the history of present illness.   All other systems are personally reviewed and negative.   Exam:   BP 120/80   Pulse 73   Wt 64 kg (141 lb)   SpO2 96%   BMI 25.79 kg/m  General: NAD Neck: No JVD, no thyromegaly or thyroid nodule.  Lungs: Clear to auscultation bilaterally with normal respiratory effort. CV: Nondisplaced PMI.  Heart regular S1/S2, no S3/S4, no murmur.  No peripheral edema.  No carotid bruit.  Normal pedal pulses.  Abdomen: Soft, nontender, no hepatosplenomegaly, no distention.  Skin: Intact without lesions or rashes.  Neurologic: Alert and oriented x 3.  Psych: Normal affect. Extremities: No clubbing or cyanosis.  HEENT: Normal.   Recent Labs: 07/01/2020: Hemoglobin 12.4; Magnesium 2.1; Platelets 275  Personally reviewed   Wt Readings from Last 3 Encounters:  07/01/20 64 kg (141 lb)  07/06/18 63 kg (139 lb)  08/23/17 68.6 kg (151 lb 4 oz)  ASSESSMENT AND PLAN:  1. Takotsubo cardiomyopathy: Resolved, EF normal on last echo in 6/17.  - She will remain on valsartan 80 mg bid. Recent BMET stable.   - She has stopped bisoprolol.  I think that she may develop dyspnea/wheezing/respiratory decompensation on beta blockers and would avoid (had trouble with Coreg then bisoprolol).   2. Atrial fibrillation: Paroxysmal.  She has had afib ablation x 2. She stopped Tikosyn in 3/17 but restarted in 9/17 and had DCCV to NSR.  She had planned a 3rd atrial fibrillation ablation with Dr. Clyda Hurdle at [facility] but he has moved.  No recent palpitations. NSR today.  - Continue Eliquis. Recent CBC stable.  - Continue dofetilide.  Recent BMET normal, check Mg today.  3. HTN: Continue valsartan and add amlodipine 2.5 mg daily. BP is controlled.  4. Abdominal pain: She can hold Eliquis for 2 days for colonoscopy.   I will refer her to a PCP.   Recommended follow-up:  6 months.    Signed, Loralie Champagne, MD  07/01/2020   [facility] [address] [facility] and [place] [address] [phone] (office) [phone] (fax)

## 2020-07-01 NOTE — Addendum Note (Signed)
Encounter addended by: Larey Dresser, MD on: 07/01/2020 9:27 PM  Actions taken: Level of Service modified, Medication List reviewed, Problem List reviewed, Allergies reviewed

## 2020-07-01 NOTE — Patient Instructions (Addendum)
Labs done today, your results will be available in MyChart, we will contact you for abnormal readings.   Please call our office in February to schedule your follow up appointment   You have been referred to [facility], they will contact you to schedule an appointment   If you have any questions or concerns before your next appointment please send [place] a message through [facility] or call our office at [phone].    TO LEAVE A MESSAGE FOR THE NURSE SELECT OPTION 2, PLEASE LEAVE A MESSAGE INCLUDING: . YOUR NAME . DATE OF BIRTH . CALL BACK NUMBER . REASON FOR CALL**this is important as we prioritize the call backs  [place] AS LONG AS YOU CALL BEFORE 4:00 PM   At the [facility], you and your health needs are our priority. As part of our continuing mission to provide you with exceptional heart care, we have created designated Provider Care Teams. These Care Teams include your primary Cardiologist (physician) and Advanced Practice Providers (APPs- Physician Assistants and Nurse Practitioners) who all work together to provide you with the care you need, when you need it.   You may see any of the following providers on your designated Care Team at your next follow up: Marland Kitchen Dr Glori Bickers . Dr Loralie Champagne . Darrick Grinder, NP . Lyda Jester, PA . Audry Riles, PharmD   Please be sure to bring in all your medications bottles to every appointment.

## 2020-07-02 ENCOUNTER — Telehealth (HOSPITAL_COMMUNITY): Payer: Self-pay | Admitting: *Deleted

## 2020-07-02 NOTE — Telephone Encounter (Signed)
Pt left VM stating she was late taking her eliquis yesterday due to an appt here but she felt like she was going into afib when she took her meds later than normal. Pt said she is scared to come off eliquis for endoscopic procedure and wants to know what her options are. Pt does not want to hold eliquis in fear of going into afib.  Routed to [place] for advice

## 2020-07-02 NOTE — Progress Notes (Signed)
Please schedule patient as new patient.

## 2020-07-02 NOTE — Telephone Encounter (Signed)
Eliquis will not affect her going into atrial fibrillation at all.  Tikosyn prevents afib, Eliquis has nothing to do with keeping her out of atrial fibrillation, it is just a blood thinner with NO anti-arrhythmic effect.

## 2020-07-02 NOTE — Telephone Encounter (Signed)
Pt's surgical clearance from completed and signed by Dr Aundra Dubin, pt cleared for endoscopic procedure and to hold Eliquis 2 days prior. Form faxed back to [place] at [phone]

## 2020-07-02 NOTE — Progress Notes (Signed)
Please call this patient and get her set up for establish care visit.  Asked to see her by Dr. Aundra Dubin

## 2020-07-02 NOTE — Telephone Encounter (Signed)
Left VM requesting pt return my call  

## 2020-07-04 NOTE — Telephone Encounter (Signed)
lmtrc

## 2020-07-09 ENCOUNTER — Telehealth (HOSPITAL_COMMUNITY): Payer: Self-pay | Admitting: Cardiology

## 2020-07-09 NOTE — Telephone Encounter (Signed)
Pt request refills for Valsartan, Tikosyn, Amlodipine, and Eliquis, please send scripts to [facility], pt is out of Valsartan send Rx  to [facility], on [address] [phone]) to pick up today

## 2020-07-10 ENCOUNTER — Other Ambulatory Visit (HOSPITAL_COMMUNITY): Payer: Self-pay | Admitting: *Deleted

## 2020-07-10 MED ORDER — APIXABAN 5 MG PO TABS
5.0000 mg | ORAL_TABLET | Freq: Two times a day (BID) | ORAL | 11 refills | Status: DC
Start: 2020-07-10 — End: 2020-07-18

## 2020-07-10 MED ORDER — DOFETILIDE 500 MCG PO CAPS: 500.0000 ug | ORAL_CAPSULE | Freq: Two times a day (BID) | ORAL | 1 refills | Status: DC

## 2020-07-10 MED ORDER — VALSARTAN 80 MG PO TABS
80.0000 mg | ORAL_TABLET | Freq: Two times a day (BID) | ORAL | 0 refills | Status: DC
Start: 2020-07-10 — End: 2020-07-10

## 2020-07-10 MED ORDER — VALSARTAN 80 MG PO TABS
80.0000 mg | ORAL_TABLET | Freq: Two times a day (BID) | ORAL | 0 refills | Status: DC
Start: 2020-07-10 — End: 2020-07-18

## 2020-07-10 MED ORDER — AMLODIPINE BESYLATE 2.5 MG PO TABS
2.5000 mg | ORAL_TABLET | Freq: Every day | ORAL | 1 refills | Status: DC
Start: 2020-07-10 — End: 2020-07-18

## 2020-07-16 NOTE — Progress Notes (Signed)
[place] at [facility], [place], [place] [address] [phone]  Date:  07/17/2020   Name:  Maria Arnold   DOB:  October 25, 1946   MRN:  [id number]  PCP:  Darreld Mclean, MD    Chief Complaint: New Patient (Initial Visit) and Bladder Pressure (pain in bladder/pressure, 6 months, no hematuria)   History of Present Illness:  Maria Arnold is a 74 y.o. very pleasant female patient who presents with the following:  Patient here today to establish care-referred to our office by Dr. Aundra Dubin History of atrial fibrillation on Eliquis and Tikosyn, cardiomyopathy which is now resolved, hypertension Per Dr. Claris Gladden most recent note 07/01/20:   1. Takotsubo cardiomyopathy: Resolved, EF normal on last echo in 6/17.  - She will remain on valsartan 80 mg bid. Recent BMET stable.   - She has stopped bisoprolol. I think that she may develop dyspnea/wheezing/respiratory decompensation on beta blockers and would avoid (had trouble with Coreg then bisoprolol).  2. Atrial fibrillation: Paroxysmal. She has had afib ablation x 2. She stopped Tikosyn in 3/17 but restarted in 9/17 and had DCCV to NSR. She had planned a 3rd atrial fibrillation ablation with Dr. Clyda Hurdle at [facility] but he has moved.No recent palpitations. NSR today.  - Continue Eliquis. Recent CBC stable.  - Continue dofetilide. Recent BMET normal, check Mg today.  3. TIW:PYKDXIPJ valsartan and add amlodipine 2.5 mg daily. BP is controlled.  4. Abdominal pain: She can hold Eliquis for 2 days for colonoscopy  Bone density scan Mammogram- per GYN Routine labs/hepatitis C screening-CBC, lipids done in August COVID-19 series; complete, finished in February so she will schedule her booster soon Pneumonia series Her GYN has been Dance movement psychotherapist but her doc retired.  She has kept up with appropriate pap screening  She is under stress as her husband has metastatic prostate cancer- this has  caused her to put her a-fib on the back burner She had 2 ablations so far and is planning to do another when he can Her main concern today is a feeling of bladder fullness and pressure which has been present for several months.  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  Her children are local In her free time she enjoys doing Psychologist, occupational and charitable work  She enjoys time with her friends and walking for exercise   Patient Active Problem List   Diagnosis Date Noted  . Persistent atrial fibrillation ([place]) 07/15/2016  . Paroxysmal atrial fibrillation ([facility])   . A-fib ([place]) 02/12/2015  . Encounter for therapeutic drug monitoring 12/29/2013  . 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 Conjunctival hemorrhage 02/28/2012  . Hypertension 08/24/2011  . Long term (current) use of anticoagulants 02/05/2011  . Atrial fibrillation ([place]) 01/09/2011  . SYSTOLIC HEART FAILURE, CHRONIC 01/09/2011    Past Medical History:  Diagnosis Date  . Asthma   . Beta-blocker intolerance   . Foot pain    From bone spur  . History of narrow angle glaucoma   . HTN (hypertension)   . NICM (nonischemic cardiomyopathy) ([place])    a. EF 30-35% in 2012 -> LHC (2/12): No coronary disease, Cardiomyopathy felt either tachycardia-mediated or Takotsubo (emotional shock the week before hospitalization). b. f/u echo 05/2011 with EF 55-60%, normal RV, normal valves.   . Paroxysmal atrial fibrillation  (H[facility])    a. AF ablation x 2. b. Previously on Tikosyn.    Past Surgical History:  Procedure Laterality Date  . ATRIAL FIBRILLATION ABLATION N/A 09/22/2012   Procedure: ATRIAL FIBRILLATION ABLATION;  Surgeon: [name], MD;  Location[facility] C[facility] LAB;  Service: Cardiovascular;  Laterality: N/A;  . ATRIAL FIBRILLATION ABLATION  02/12/2015  . ATRIAL FIBRILLATION ABLATION N/A 02/12/2015   Procedure: ATRIAL FIBRILLATION ABLATION;  Surgeon: [name], MD;  Location: [facility] CATH LAB;  Service: Cardiovascular;  Laterality: N/A;  . CARDIAC CATHETERIZATION  12/2010  . CARDIOVERSION N/A 07/17/2016   Procedure: CARDIOVERSION;  Surgeon: Thayer Headings, MD;  Location: [facility] ENDOSCOPY;  Service: Cardiovascular;  Laterality: N/A;  . DILATION AND CURETTAGE OF UTERUS    . LAPAROSCOPIC ABDOMINAL EXPLORATION  1980's  . NASAL SINUS SURGERY  1980's  . TEE WITHOUT CARDIOVERSION  09/21/2012   Procedure: TRANSESOPHAGEAL ECHOCARDIOGRAM (TEE);  Surgeon: Thayer Headings, MD;  Location: [facility];  Service: Cardiovascular;  Laterality: N/A;  . TEE WITHOUT CARDIOVERSION N/A 02/12/2015   Procedure: TRANSESOPHAGEAL ECHOCARDIOGRAM (TEE);  Surgeon: Josue Hector, MD;  Location: [facility];  Service: Cardiovascular;  Laterality: N/A;  . TONSILLECTOMY AND ADENOIDECTOMY  1953    Social History   Tobacco Use  . Smoking status: Former Smoker    Years: 11.00    Types: Cigarettes  . Smokeless tobacco: Never Used  . Tobacco comment: "quit smoking in 1976"  Substance Use Topics  . Alcohol use: Yes    Alcohol/week: 7.0 standard drinks    Types: 7 Glasses of wine per week  . Drug use: No    Family History  Problem Relation Age of Onset  . Prostate cancer Father   . Skin cancer Father   . Emphysema Mother   . Coronary artery disease Neg Hx        Premature    Allergies  Allergen Reactions  . Cephalexin Itching    Keflex Per pt Keflex Per pt   . Lisinopril Cough  . Tobramycin Other (See Comments)    Increased  eye redness with use Increased eye redness with use Other reaction(s): Other (See Comments) Increased eye redness with use     Medication list has been reviewed and updated.  Current Outpatient Medications on File Prior to Visit  Medication Sig Dispense Refill  . amLODipine (NORVASC) 2.5 MG tablet Take 1 tablet (2.5 mg total) by mouth daily. 180 tablet 1  . apixaban (ELIQUIS) 5 MG TABS tablet Take 1 tablet (5 mg total) by mouth 2 (two) times daily. 60 tablet 11  . Ascorbic Acid (VITAMIN C) 1000 MG tablet Take 1,000 mg by mouth daily.    . carboxymethylcellulose (REFRESH TEARS) 0.5 % SOLN Apply 1-2 drops to eye 3 (three) times daily as needed (dry eyes).    . dofetilide (TIKOSYN) 500 MCG capsule Take 1 capsule (500 mcg total) by mouth 2 (two) times daily. 180 capsule 1  . Magnesium 200 MG TABS Take 400 mg by mouth in  the morning and at bedtime.     . Multiple Vitamin (MULTI-VITAMIN) tablet Take 1 tablet by mouth daily.    . valsartan (DIOVAN) 80 MG tablet Take 1 tablet (80 mg total) by mouth 2 (two) times daily. 60 tablet 0  . albuterol (PROVENTIL HFA;VENTOLIN HFA) 108 (90 Base) MCG/ACT inhaler TAKE 2 PUFFS BY MOUTH EVERY 6 HOURS AS NEEDED FOR WHEEZE OR SHORTNESS OF BREATH (Patient not taking: Reported on 07/17/2020) 8.5 Inhaler 2  . albuterol (PROVENTIL) (2.5 MG/3ML) 0.083% nebulizer solution Take 3 mLs (2.5 mg total) by nebulization every 6 (six) hours as needed for wheezing or shortness of breath. (Patient not taking: Reported on 07/17/2020) 75 mL 12   No current facility-administered medications on file prior to visit.    Review of Systems:  As per HPI- otherwise negative.   Physical Examination: Vitals:   07/17/20 1323 07/17/20 1352  BP: (!) 148/90 140/90  Pulse: (!) 103   Resp: 15   SpO2: 96%    Vitals:   07/17/20 1323  Weight: 142 lb (64.4 kg)  Height: 5\' 2"  (1.575 m)   Body mass index is 25.97 kg/m. Ideal Body Weight: Weight in (lb) to have BMI = 25: 136.4  GEN: no  acute distress.  Normal weight, looks well  HEENT: Atraumatic, Normocephalic.  Ears and Nose: No external deformity. CV: RRR, No M/G/R. No JVD. No thrill. No extra heart sounds. PULM: CTA B, no wheezes, crackles, rhonchi. No retractions. No resp. distress. No accessory muscle use. ABD: S, ND, +BS. No rebound. No HSM.  She has mild tenderness over the bladder, no masses noted EXTR: No c/c/e PSYCH: Normally interactive. Conversant.   BP Readings from Last 3 Encounters:  07/17/20 140/90  07/01/20 120/80  07/06/18 121/74     Results for orders placed or performed in visit on 07/17/20  POCT urinalysis dipstick  Result Value Ref Range   Color, UA yellow yellow   Clarity, UA clear clear   Glucose, UA negative negative mg/dL   Bilirubin, UA negative negative   Ketones, POC UA negative negative mg/dL   Spec Grav, UA 1.010 1.010 - 1.025   Blood, UA negative negative   pH, UA 6.5 5.0 - 8.0   Protein Ur, POC negative negative mg/dL   Urobilinogen, UA 0.2 0.2 or 1.0 E.U./dL   Nitrite, UA Negative Negative   Leukocytes, UA Negative Negative    Assessment and Plan: Urinary frequency - Plan: POCT urinalysis dipstick  Pelvic pressure in female - Plan: US Pelvic Complete With Transvaginal  Pt here today to establish care She has A fib treated with eliquis, tikosyn Her main concern today is a longer term sensation of bladder pressure.  Urine dip is reassuring Recnet abd US shows normal kidneys, recent labs normal as well She did have a small GB polyp on her recent US, reminded pt that a 12 month follow-up is recommended  Will obtain urine culture.  Pt admits she is also concerned about ovarian pathology.  We can certainly get a pelvic US for further eval and hopefully reassurance.  Will be in touch with urine culture  This visit occurred during the SARS-CoV-2 public health emergency.  Safety protocols were in place, including screening questions prior to the visit, additional usage of staff  PPE, and extensive cleaning of exam room while observing appropriate contact time as indicated for disinfecting solutions.    Signed Lamar Blinks, MD

## 2020-07-17 ENCOUNTER — Other Ambulatory Visit: Payer: Self-pay

## 2020-07-17 ENCOUNTER — Ambulatory Visit (INDEPENDENT_AMBULATORY_CARE_PROVIDER_SITE_OTHER): Payer: Medicare Other | Admitting: Family Medicine

## 2020-07-17 ENCOUNTER — Encounter: Payer: Self-pay | Admitting: Family Medicine

## 2020-07-17 VITALS — BP 140/90 | HR 103 | Resp 15 | Ht 62.0 in | Wt 142.0 lb

## 2020-07-17 DIAGNOSIS — R350 Frequency of micturition: Secondary | ICD-10-CM

## 2020-07-17 DIAGNOSIS — R102 Pelvic and perineal pain: Secondary | ICD-10-CM

## 2020-07-17 LAB — POCT URINALYSIS DIP (MANUAL ENTRY)
Bilirubin, UA: NEGATIVE
Blood, UA: NEGATIVE
Glucose, UA: NEGATIVE mg/dL
Ketones, POC UA: NEGATIVE mg/dL
Leukocytes, UA: NEGATIVE
Nitrite, UA: NEGATIVE
Protein Ur, POC: NEGATIVE mg/dL
Spec Grav, UA: 1.01 (ref 1.010–1.025)
Urobilinogen, UA: 0.2 E.U./dL
pH, UA: 6.5 (ref 5.0–8.0)

## 2020-07-17 NOTE — Patient Instructions (Addendum)
I will be in touch with your urine culture asap and we will set you up for a pelvic ultrasound to check your uterus and ovaries   Please let me know if you have any change or worsening of your symptoms

## 2020-07-18 ENCOUNTER — Other Ambulatory Visit (HOSPITAL_COMMUNITY): Payer: Self-pay

## 2020-07-18 ENCOUNTER — Ambulatory Visit (HOSPITAL_BASED_OUTPATIENT_CLINIC_OR_DEPARTMENT_OTHER)
Admission: RE | Admit: 2020-07-18 | Discharge: 2020-07-18 | Disposition: A | Discharge status: Home / Self Care | Payer: Medicare Other | Source: Ambulatory Visit | Attending: Family Medicine | Admitting: Family Medicine

## 2020-07-18 DIAGNOSIS — R102 Pelvic and perineal pain: Secondary | ICD-10-CM | POA: Diagnosis not present

## 2020-07-18 MED ORDER — AMLODIPINE BESYLATE 2.5 MG PO TABS: 2.5000 mg | ORAL_TABLET | Freq: Every day | ORAL | 0 refills | Status: DC

## 2020-07-18 MED ORDER — AMLODIPINE BESYLATE 2.5 MG PO TABS
2.5000 mg | ORAL_TABLET | Freq: Every day | ORAL | 3 refills | Status: DC
Start: 2020-07-18 — End: 2022-03-31

## 2020-07-18 MED ORDER — VALSARTAN 80 MG PO TABS: 80.0000 mg | ORAL_TABLET | Freq: Two times a day (BID) | ORAL | 0 refills | Status: DC

## 2020-07-18 MED ORDER — APIXABAN 5 MG PO TABS
5.0000 mg | ORAL_TABLET | Freq: Two times a day (BID) | ORAL | 3 refills | Status: DC
Start: 2020-07-18 — End: 2020-11-27

## 2020-07-18 MED ORDER — APIXABAN 5 MG PO TABS: 5.0000 mg | ORAL_TABLET | Freq: Two times a day (BID) | ORAL | 0 refills | Status: DC

## 2020-07-18 MED ORDER — VALSARTAN 80 MG PO TABS
80.0000 mg | ORAL_TABLET | Freq: Two times a day (BID) | ORAL | 3 refills | Status: DC
Start: 2020-07-18 — End: 2020-10-25

## 2020-07-18 NOTE — Telephone Encounter (Signed)
Patient called stating that she still hasn't received her mail order Rx that were sent to [facility] and wanted to know if we could send her in a 10 day supply. She also stated that [facility] up all of her medications so I resent in Rx to them as well for a year supply. Patient was very Patent attorney.

## 2020-07-19 ENCOUNTER — Encounter: Payer: Self-pay | Admitting: Family Medicine

## 2020-07-19 DIAGNOSIS — R102 Pelvic and perineal pain: Secondary | ICD-10-CM

## 2020-07-19 DIAGNOSIS — D7589 Other specified diseases of blood and blood-forming organs: Secondary | ICD-10-CM

## 2020-07-19 DIAGNOSIS — R1900 Intra-abdominal and pelvic swelling, mass and lump, unspecified site: Secondary | ICD-10-CM

## 2020-07-19 DIAGNOSIS — R5383 Other fatigue: Secondary | ICD-10-CM

## 2020-07-19 DIAGNOSIS — R300 Dysuria: Secondary | ICD-10-CM

## 2020-07-20 ENCOUNTER — Other Ambulatory Visit (HOSPITAL_COMMUNITY): Payer: Self-pay | Admitting: Cardiology

## 2020-07-23 ENCOUNTER — Encounter (HOSPITAL_COMMUNITY): Payer: Medicare Other | Admitting: Cardiology

## 2020-07-26 ENCOUNTER — Other Ambulatory Visit: Payer: Self-pay

## 2020-07-26 ENCOUNTER — Other Ambulatory Visit: Payer: Medicare Other

## 2020-07-26 DIAGNOSIS — R300 Dysuria: Secondary | ICD-10-CM

## 2020-07-27 LAB — URINE CULTURE
MICRO NUMBER:: 10964752
SPECIMEN QUALITY:: ADEQUATE

## 2020-07-28 ENCOUNTER — Encounter: Payer: Self-pay | Admitting: Family Medicine

## 2020-07-29 ENCOUNTER — Telehealth (INDEPENDENT_AMBULATORY_CARE_PROVIDER_SITE_OTHER): Payer: Medicare Other | Admitting: Family Medicine

## 2020-07-29 ENCOUNTER — Other Ambulatory Visit: Payer: Self-pay

## 2020-07-29 DIAGNOSIS — R102 Pelvic and perineal pain: Secondary | ICD-10-CM | POA: Diagnosis not present

## 2020-07-29 NOTE — Progress Notes (Signed)
[place] [facility] [address] [place], [place] [address] [phone]  Date:  07/29/2020   Name:  Maria Arnold   DOB:  May 10, 1946   MRN:  [id number]  PCP:  Darreld Mclean, MD    Chief Complaint: No chief complaint on file.   History of Present Illness:  Maria Arnold is a 74 y.o. very pleasant female patient who presents with the following:  Virtual visit today to discuss  Connected with pt via virtual visit Pt location is home, provider location is home The pt and myself are on the call today   History of a fib on eliquis  The pt notes "extreme fatigue" and a feeling of pressure over her bladder for about one year.  She thought it might be due to an ovarian issue.  Recent pelvic US negative, urine culture negative  After discussion she would like to see GYN - will make referral   I will refer her to GYN- she does not have one at this time We would like to get a B12 level which is reasonable given macrocytosis- however I cannot sign order as medicare requires a waiver be signed first     Patient Active Problem List   Diagnosis Date Noted  . Persistent atrial fibrillation ([place]) 07/15/2016  . Paroxysmal atrial fibrillation (HCC)   . A-fib ([place]) 02/12/2015  . Encounter for therapeutic drug monitoring 12/29/2013  . 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 Conjunctival hemorrhage 02/28/2012  . Hypertension 08/24/2011  . Long term (current) use of anticoagulants 02/05/2011  . Atrial fibrillation ([place]) 01/09/2011  . SYSTOLIC HEART FAILURE, CHRONIC 01/09/2011    Past Medical History:  Diagnosis Date  . Asthma   . Beta-blocker intolerance   . Foot pain    From bone spur  . History of narrow angle glaucoma   . HTN (hypertension)   . NICM (nonischemic cardiomyopathy) ([place])    a. EF 30-35% in 2012 -> LHC (2/12): No coronary disease, Cardiomyopathy felt either tachycardia-mediated or Takotsubo (emotional shock the  week before hospitalization). b. f/u echo 05/2011 with EF 55-60%, normal RV, normal valves.   . Paroxysmal atrial fibrillation (HCC)    a. AF ablation x 2. b. Previously on Tikosyn.    Past Surgical History:  Procedure Laterality Date  . ATRIAL FIBRILLATION ABLATION N/A 09/22/2012   Procedure: ATRIAL FIBRILLATION ABLATION;  Surgeon: [name], MD;  Location: [facility] CATH LAB;  Service: Cardiovascular;  Laterality: N/A;  . ATRIAL FIBRILLATION ABLATION  02/12/2015  . ATRIAL FIBRILLATION ABLATION N/A 02/12/2015   Procedure: ATRIAL FIBRILLATION ABLATION;  Surgeon: [name], MD;  Location: [facility] CATH LAB;  Service: Cardiovascular;  Laterality: N/A;  . CARDIAC CATHETERIZATION  12/2010  . CARDIOVERSION N/A 07/17/2016   Procedure: CARDIOVERSION;  Surgeon: Thayer Headings, MD;  Location: [facility] ENDOSCOPY;  Service: Cardiovascular;  Laterality: N/A;  . DILATION AND CURETTAGE OF UTERUS    . LAPAROSCOPIC ABDOMINAL EXPLORATION  1980's  . NASAL SINUS SURGERY  1980's  . TEE WITHOUT CARDIOVERSION  09/21/2012   Procedure: TRANSESOPHAGEAL ECHOCARDIOGRAM (TEE);  Surgeon: Thayer Headings, MD;  Location: [place];  Service: Cardiovascular;  Laterality: N/A;  . TEE WITHOUT CARDIOVERSION N/A 02/12/2015   Procedure: TRANSESOPHAGEAL ECHOCARDIOGRAM (TEE);  Surgeon: Josue Hector, MD;  Location: [place];  Service: Cardiovascular;  Laterality: N/A;  . TONSILLECTOMY AND ADENOIDECTOMY  1953    Social History   Tobacco Use  . Smoking status:  Former Smoker    Years: 11.00    Types: Cigarettes  . Smokeless tobacco: Never Used  . Tobacco comment: "quit smoking in 1976"  Substance Use Topics  . Alcohol use: Yes    Alcohol/week: 7.0 standard drinks    Types: 7 Glasses of wine per week  . Drug use: No    Family History  Problem Relation Age of Onset  . Prostate cancer Father   . Skin cancer Father   . Emphysema Mother   . Coronary artery disease Neg Hx        Premature    Allergies  Allergen Reactions  .  Cephalexin Itching    Keflex Per pt Keflex Per pt   . Lisinopril Cough  . Tobramycin Other (See Comments)    Increased eye redness with use Increased eye redness with use Other reaction(s): Other (See Comments) Increased eye redness with use     Medication list has been reviewed and updated.  Current Outpatient Medications on File Prior to Visit  Medication Sig Dispense Refill  . albuterol (PROVENTIL HFA;VENTOLIN HFA) 108 (90 Base) MCG/ACT inhaler TAKE 2 PUFFS BY MOUTH EVERY 6 HOURS AS NEEDED FOR WHEEZE OR SHORTNESS OF BREATH (Patient not taking: Reported on 07/17/2020) 8.5 Inhaler 2  . albuterol (PROVENTIL) (2.5 MG/3ML) 0.083% nebulizer solution Take 3 mLs (2.5 mg total) by nebulization every 6 (six) hours as needed for wheezing or shortness of breath. (Patient not taking: Reported on 07/17/2020) 75 mL 12  . amLODipine (NORVASC) 2.5 MG tablet Take 1 tablet (2.5 mg total) by mouth daily. 90 tablet 3  . amLODipine (NORVASC) 2.5 MG tablet Take 1 tablet (2.5 mg total) by mouth daily for 10 days. 10 tablet 0  . apixaban (ELIQUIS) 5 MG TABS tablet Take 1 tablet (5 mg total) by mouth 2 (two) times daily. 180 tablet 3  . apixaban (ELIQUIS) 5 MG TABS tablet Take 1 tablet (5 mg total) by mouth 2 (two) times daily for 10 days. 20 tablet 0  . Ascorbic Acid (VITAMIN C) 1000 MG tablet Take 1,000 mg by mouth daily.    . carboxymethylcellulose (REFRESH TEARS) 0.5 % SOLN Apply 1-2 drops to eye 3 (three) times daily as needed (dry eyes).    . dofetilide (TIKOSYN) 500 MCG capsule Take 1 capsule (500 mcg total) by mouth 2 (two) times daily. 180 capsule 1  . Magnesium 200 MG TABS Take 400 mg by mouth in the morning and at bedtime.     . Multiple Vitamin (MULTI-VITAMIN) tablet Take 1 tablet by mouth daily.    . valsartan (DIOVAN) 80 MG tablet Take 1 tablet (80 mg total) by mouth 2 (two) times daily. 180 tablet 3  . valsartan (DIOVAN) 80 MG tablet Take 1 tablet (80 mg total) by mouth 2 (two) times daily for 10  days. 20 tablet 0   No current facility-administered medications on file prior to visit.    Review of Systems:  As per HPI- otherwise negative.   Physical Examination: There were no vitals filed for this visit. There were no vitals filed for this visit. There is no height or weight on file to calculate BMI. Ideal Body Weight:    Patient observed her video monitor.  She looks well, her normal self.  No distress, cough, wheezing is noted She is not checking vitals right now   Assessment and Plan: Pelvic pressure in female - Plan: Ambulatory referral to Obstetrics / Gynecology  Referral to GYN for pelvic pressure concern Pt  has noted low energy and also has macrocytosis.  Scheduled her for later this week to come in and sign lab waivers so we can order B12/ folate levels for her  Video used for entire visit today Signed Lamar Blinks, MD

## 2020-07-31 ENCOUNTER — Ambulatory Visit: Payer: Medicare Other | Admitting: Family Medicine

## 2020-08-01 ENCOUNTER — Ambulatory Visit: Payer: Medicare Other | Admitting: Family Medicine

## 2020-08-07 ENCOUNTER — Other Ambulatory Visit (HOSPITAL_COMMUNITY): Payer: Self-pay | Admitting: Cardiology

## 2020-09-11 ENCOUNTER — Encounter: Payer: Self-pay | Admitting: Family Medicine

## 2020-09-16 NOTE — Progress Notes (Addendum)
[place] [facility] [place], [place], [place] [address] [phone]  Date:  09/18/2020   Name:  Maria Arnold   DOB:  01/25/1946   MRN:  [id number]  PCP:  Darreld Mclean, MD    Chief Complaint: Bladder Concern (urinary frequency, dysuria, seen at urology and GI), Lab Work, and Bronchitis (Hx of bronchitis)   History of Present Illness:  Maria Arnold is a 74 y.o. 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  She is also concerned about bladder pain, fatigue, hair loss  She notes feeling fatigued "again," she has started on oral iron in hopes that it might help She notes that she feels well in the am, but after 3-4 hours she will feel really tired and starts having bladder pain She feels "like my bladder is completely full all the time, I will get relief after urination" for a brief period of time but then the sensation of fullness returns She is seeing urology on Friday actually- a personal friend is a urologist and is seeing her this week  She also notes concern of hair loss for about 20 years.  This seemed to begin when she came off hormone replacement therapy which she had been taking at the start of menopause.  She does feel like this is worse recently  Her husband has advanced prostate cancer and they are traveling to the [facility] clinic soon   We got a pelvic ultrasound in September as follows IMPRESSION: Nonvisualization of ovaries. Question small 6 mm calcified leiomyoma at posterior upper uterus. Remainder of exam unremarkable.  Urine culture, urinalysis have been negative  We have made a referral  to gynecology, her appt is actually later today  Recent message from patient: Dr. Edilia Bo - I have been waiting for your advice about my bladder. It is still hurting and this past week, more than ever!!!! I  need to talk to you about this.  I  think that I should see you asap.. Could you take an X-ray?..Marland KitchenMarland KitchenI am beginning to get real concerned.Marland KitchenMarland KitchenMarland KitchenAs for  the gynecology appointment, it took a long time for me to even hear from the doctor's office....and I still am waiting for an appointment.Marland KitchenMarland KitchenIf it is okay, I think that i will call your office and try to make an appointment to see you.Marland KitchenMarland KitchenMarland KitchenMy husband may be taking some treatments out of town and I need to know what is going on with me. Thank you. [name]   Mammogram- order for pt Bone density scan- order for pt Flu vaccine- give today   She is seeing Dr Nicki Reaper with [facility] for her foot.  She notes that she has a large bone spur which is affecting her gait and causing back pain.  She plans to have this removed when possible  BP Readings from Last 3 Encounters:  09/18/20 110/60  07/17/20 140/90  07/01/20 120/80    Patient Active Problem List   Diagnosis Date Noted  . Persistent atrial fibrillation ([place]) 07/15/2016  . Paroxysmal atrial fibrillation (HCC)   . A-fib ([place]) 02/12/2015  . Encounter for therapeutic drug monitoring 12/29/2013  .  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 Conjunctival hemorrhage 02/28/2012  . Hypertension 08/24/2011  . Long term (current) use of anticoagulants 02/05/2011  . Atrial fibrillation ([place]) 01/09/2011  . SYSTOLIC HEART FAILURE, CHRONIC 01/09/2011    Past Medical History:  Diagnosis Date  . Asthma   . Beta-blocker intolerance   . Foot pain    From bone spur  . History of narrow angle glaucoma   . HTN (hypertension)   . NICM (nonischemic cardiomyopathy) ([place])    a. EF 30-35% in 2012 -> LHC (2/12): No coronary disease, Cardiomyopathy felt either tachycardia-mediated or Takotsubo (emotional shock the week  before hospitalization). b. f/u echo 05/2011 with EF 55-60%, normal RV, normal valves.   . Paroxysmal atrial fibrillation (HCC)    a. AF ablation x 2. b. Previously on Tikosyn.    Past Surgical History:  Procedure Laterality Date  . ATRIAL FIBRILLATION ABLATION N/A 09/22/2012   Procedure: ATRIAL FIBRILLATION ABLATION;  Surgeon: [name], MD;  Location: [facility] CATH LAB;  Service: Cardiovascular;  Laterality: N/A;  . ATRIAL FIBRILLATION ABLATION  02/12/2015  . ATRIAL FIBRILLATION ABLATION N/A 02/12/2015   Procedure: ATRIAL FIBRILLATION ABLATION;  Surgeon: [name], MD;  Location: [facility] CATH LAB;  Service: Cardiovascular;  Laterality: N/A;  . CARDIAC CATHETERIZATION  12/2010  . CARDIOVERSION N/A 07/17/2016   Procedure: CARDIOVERSION;  Surgeon: Thayer Headings, MD;  Location: [facility] ENDOSCOPY;  Service: Cardiovascular;  Laterality: N/A;  . DILATION AND CURETTAGE OF UTERUS    . LAPAROSCOPIC ABDOMINAL EXPLORATION  1980's  . NASAL SINUS SURGERY  1980's  . TEE WITHOUT CARDIOVERSION  09/21/2012   Procedure: TRANSESOPHAGEAL ECHOCARDIOGRAM (TEE);  Surgeon: Thayer Headings, MD;  Location: [place];  Service: Cardiovascular;  Laterality: N/A;  . TEE WITHOUT CARDIOVERSION N/A 02/12/2015   Procedure: TRANSESOPHAGEAL ECHOCARDIOGRAM (TEE);  Surgeon: Josue Hector, MD;  Location: [place];  Service: Cardiovascular;  Laterality: N/A;  . TONSILLECTOMY AND ADENOIDECTOMY  1953    Social History   Tobacco Use  . Smoking status: Former Smoker    Years: 11.00    Types: Cigarettes  . Smokeless tobacco: Never Used  . Tobacco comment: "quit smoking in 1976"  Substance Use Topics  . Alcohol use: Yes    Alcohol/week: 7.0 standard drinks    Types: 7 Glasses of wine per week  . Drug use: No    Family History  Problem Relation Age of Onset  . Prostate cancer Father   . Skin cancer Father   . Emphysema Mother   . Coronary artery disease Neg Hx        Premature    Allergies  Allergen Reactions  .  Cephalexin Itching    Keflex Per pt Keflex Per pt   . Lisinopril Cough    Medication list has been reviewed and updated.  Current Outpatient Medications on File Prior to Visit  Medication Sig Dispense Refill  . amLODipine (NORVASC) 2.5 MG tablet Take 1 tablet (2.5 mg total) by mouth daily. 90 tablet 3  . apixaban (ELIQUIS) 5 MG TABS tablet Take 1 tablet (5 mg total) by mouth 2 (two) times daily. 180 tablet 3  . ascorbic acid (VITAMIN C) 500 MG tablet Take 500 mg by mouth daily.    . carboxymethylcellulose (REFRESH TEARS) 0.5 % SOLN Apply 1-2 drops to eye 3 (three) times daily as needed (dry eyes).    . dofetilide (TIKOSYN) 500 MCG capsule Take 1 capsule (500 mcg total) by mouth 2 (two) times daily. 180 capsule 1  .  magnesium oxide (MAG-OX) 400 MG tablet Take 400 mg by mouth daily.    . Multiple Vitamin (MULTI-VITAMIN) tablet Take 1 tablet by mouth daily.    Marland Kitchen UNABLE TO FIND Take by mouth daily. Med Name: 1 teaspoon Magnesium    . UNABLE TO FIND Take 3,000 mg by mouth daily. Med Name:3000 mg b12 throat lasagne    . valsartan (DIOVAN) 80 MG tablet Take 1 tablet (80 mg total) by mouth 2 (two) times daily. 180 tablet 3   No current facility-administered medications on file prior to visit.    Review of Systems:  As per HPI- otherwise negative.   Physical Examination: Vitals:   09/18/20 1106  BP: 110/60  Pulse: 73  Resp: 17  SpO2: 97%   Vitals:   09/18/20 1106  Weight: 140 lb (63.5 kg)  Height: 5\' 2"  (1.575 m)   Body mass index is 25.61 kg/m. Ideal Body Weight: Weight in (lb) to have BMI = 25: 136.4  GEN: no acute distress.  Normal weight, looks well.  Patient seems somewhat anxious today HEENT: Atraumatic, Normocephalic.  Ears and Nose: No external deformity. CV: RRR, No M/G/R. No JVD. No thrill. No extra heart sounds. PULM: CTA B, no wheezes, crackles, rhonchi. No retractions. No resp. distress. No accessory muscle use. ABD: S, NT, ND, +BS. No rebound. No HSM. EXTR:  No c/c/e PSYCH: Normally interactive. Conversant.    Assessment and Plan: Hair loss - Plan: Ambulatory referral to Dermatology  Bladder pain - Plan: Urine Culture  Fatigue, unspecified type - Plan: Vitamin B12, TSH, Ferritin  Vitamin deficiency - Plan: Vitamin B12, Ferritin  Iron deficiency - Plan: Ferritin  B12 deficiency - Plan: Vitamin B12  Screening mammogram for breast cancer - Plan: MM 3D SCREEN BREAST BILATERAL  Estrogen deficiency - Plan: DG Bone Density  Needs flu shot - Plan: Flu Vaccine QUAD High Dose(Fluad)   Patient is here today with multiple concerns She would like to see dermatology for hair loss.  I also suggest that she try over-the-counter Rogaine foam She has noted bladder pain for about 1 year.  She actually has a urology appointment this coming Friday.  We will repeat culture today anticipation of this appointment She is also seeing gynecology later today which will also be helpful She is concerned that a vitamin deficiency may be causing her fatigue or hair loss.  Will check thyroid, ferritin, B12 today Order mammogram and bone density Order flu vaccine  This visit occurred during the SARS-CoV-2 public health emergency.  Safety protocols were in place, including screening questions prior to the visit, additional usage of staff PPE, and extensive cleaning of exam room while observing appropriate contact time as indicated for disinfecting solutions.    Signed Lamar Blinks, MD  11/11, received labs as below Message to patient  Results for orders placed or performed in visit on 09/18/20  Vitamin B12  Result Value Ref Range   Vitamin B-12 >1526 (H) 211 - 911 pg/mL  TSH  Result Value Ref Range   TSH 1.00 0.35 - 4.50 uIU/mL  Ferritin  Result Value Ref Range   Ferritin 103.3 10.0 - 291.0 ng/mL

## 2020-09-18 ENCOUNTER — Ambulatory Visit (INDEPENDENT_AMBULATORY_CARE_PROVIDER_SITE_OTHER): Payer: Medicare Other | Admitting: Family Medicine

## 2020-09-18 ENCOUNTER — Encounter: Payer: Medicare Other | Admitting: Obstetrics & Gynecology

## 2020-09-18 ENCOUNTER — Encounter: Payer: Self-pay | Admitting: Family Medicine

## 2020-09-18 ENCOUNTER — Other Ambulatory Visit: Payer: Self-pay

## 2020-09-18 ENCOUNTER — Ambulatory Visit (INDEPENDENT_AMBULATORY_CARE_PROVIDER_SITE_OTHER): Payer: Medicare Other | Admitting: Obstetrics & Gynecology

## 2020-09-18 ENCOUNTER — Encounter: Payer: Self-pay | Admitting: Obstetrics & Gynecology

## 2020-09-18 VITALS — BP 110/60 | HR 73 | Resp 17 | Ht 62.0 in | Wt 140.0 lb

## 2020-09-18 VITALS — BP 106/54 | HR 72 | Ht 62.0 in | Wt 140.0 lb

## 2020-09-18 DIAGNOSIS — E611 Iron deficiency: Secondary | ICD-10-CM | POA: Diagnosis not present

## 2020-09-18 DIAGNOSIS — Z23 Encounter for immunization: Secondary | ICD-10-CM

## 2020-09-18 DIAGNOSIS — R3989 Other symptoms and signs involving the genitourinary system: Secondary | ICD-10-CM

## 2020-09-18 DIAGNOSIS — R300 Dysuria: Secondary | ICD-10-CM | POA: Diagnosis not present

## 2020-09-18 DIAGNOSIS — L659 Nonscarring hair loss, unspecified: Secondary | ICD-10-CM | POA: Diagnosis not present

## 2020-09-18 DIAGNOSIS — R5383 Other fatigue: Secondary | ICD-10-CM

## 2020-09-18 DIAGNOSIS — Z1231 Encounter for screening mammogram for malignant neoplasm of breast: Secondary | ICD-10-CM

## 2020-09-18 DIAGNOSIS — N958 Other specified menopausal and perimenopausal disorders: Secondary | ICD-10-CM | POA: Diagnosis not present

## 2020-09-18 DIAGNOSIS — E569 Vitamin deficiency, unspecified: Secondary | ICD-10-CM | POA: Diagnosis not present

## 2020-09-18 DIAGNOSIS — E538 Deficiency of other specified B group vitamins: Secondary | ICD-10-CM | POA: Diagnosis not present

## 2020-09-18 DIAGNOSIS — E2839 Other primary ovarian failure: Secondary | ICD-10-CM

## 2020-09-18 LAB — VITAMIN B12: Vitamin B-12: >1526 pg/mL — ABNORMAL HIGH (ref 211–911)

## 2020-09-18 LAB — FERRITIN: Ferritin: 103.3 ng/mL (ref 10.0–291.0)

## 2020-09-18 LAB — TSH: TSH: 1 u[IU]/mL (ref 0.35–4.50)

## 2020-09-18 MED ORDER — ESTRACE 0.1 MG/GM VA CREA: 1.0000 g | TOPICAL_CREAM | Freq: Every day | VAGINAL | 2 refills | Status: DC

## 2020-09-18 NOTE — Progress Notes (Signed)
Patient complaining of pelvic pressure that has increased more over the last six months. Patient states she has not had a pelvic exam in last 8 years. Patient does feel like she has lots of pressure on her bladder as well. Kathrene Alu RN

## 2020-09-18 NOTE — Progress Notes (Signed)
Subjective:     Maria Arnold is a 74 y.o. female here eval of pelvic pain and dysuria. Pt reports that the pain imporves when she empties her bladder. The sx began in April of 2021.   Pt does report that her sx sometimes feel like a burning sensation. The sx appear to be getting worse not better.   Gynecologic History No LMP recorded (exact date). Patient is postmenopausal. Contraception: post menopausal status  Obstetric History OB History  Gravida Para Term Preterm AB Living  2 2 2     2   SAB TAB Ectopic Multiple Live Births          2    # Outcome Date GA Lbr Len/2nd Weight Sex Delivery Anes PTL Lv  2 Term      Vag-Spont     1 Term      Vag-Spont      The following portions of the patient's history were reviewed and updated as appropriate: allergies, current medications, past family history, past medical history, past social history, past surgical history and problem list.  Review of Systems Pertinent items are noted in HPI.    Objective:  BP (!) 106/54   Pulse 72   Ht 5\' 2"  (1.575 m)   Wt 140 lb (63.5 kg)   LMP  (Exact Date)   BMI 25.61 kg/m   CONSTITUTIONAL: Well-developed, well-nourished female in no acute distress.  HENT:  Normocephalic, atraumatic EYES: Conjunctivae and EOM are normal. No scleral icterus.  NECK: Normal range of motion SKIN: Skin is warm and dry. No rash noted. Not diaphoretic.No pallor. [place]: Alert and oriented to person, place, and time. Normal coordination.  GU: EGBUS: no lesions; the mucosa of the vulva is atrophic. There are no lesions. There is no prolapse.  Vagina: no blood in vault Cervix: no lesion; no mucopurulent d/c Uterus: small, mobile, nontender Adnexa: no masses; non tender   07/18/2020 CLINICAL DATA:  Pelvic pressure and discomfort, postmenopausal  EXAM: TRANSABDOMINAL AND TRANSVAGINAL ULTRASOUND OF PELVIS  TECHNIQUE: Both transabdominal and transvaginal ultrasound examinations of the pelvis were performed.  Transabdominal technique was performed for global imaging of the pelvis including uterus, ovaries, adnexal regions, and pelvic cul-de-sac. It was necessary to proceed with endovaginal exam following the transabdominal exam to visualize the uterus, endometrium, and ovaries.  COMPARISON:  None  FINDINGS: Uterus  Measurements: 5.0 x 3.0 x 4.6 cm = volume: 36 mL. Heterogeneous myometrium. Retroflexed. Parenchymal calcification at posterior upper uterus question small calcified leiomyoma 6 mm diameter. No additional masses.  Endometrium  Thickness: 5 mm.  No endometrial fluid or focal abnormality  Right ovary  Not visualized, likely obscured by bowel  Left ovary  Not visualized, likely obscured by bowel  Other findings  No free pelvic fluid.  No adnexal masses.  IMPRESSION: Nonvisualization of ovaries.  Question small 6 mm calcified leiomyoma at posterior upper uterus.  Remainder of exam unremarkable.   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     Plan:   Diagnoses and all orders for this visit:  Genitourinary syndrome of menopause -     ESTRACE VAGINAL 0.1 MG/GM vaginal cream; Place 1 g vaginally daily. For 2 weeks then 3 times per week.  Dysuria -     ESTRACE VAGINAL 0.1 MG/GM vaginal cream; Place 1 g vaginally daily. For 2 weeks then 3 times per week.  f/u in 6 weeks or sooner prn.  Hoyle Sauer  Drucilla Schmidt, M.D., Cherlynn June

## 2020-09-18 NOTE — Patient Instructions (Signed)
It was good to see you again today-  I think you are on the right track seeing urology and GYN this week We will check your iron, B12, thyroid today Flu shot given today I will order a mammogram and bone density for you today Let's repeat a urine culture today so the result is back by Friday's urology appt

## 2020-09-18 NOTE — Patient Instructions (Signed)
Genitourinary Syndrome of menopause (aka Atrophic Vaginitis)  Atrophic vaginitis is a condition in which the tissues that line the vagina become dry and thin. This condition is most common in women who have stopped having regular menstrual periods (are in menopause). This usually starts when a woman is 58-74 years old. That is the time when a woman's estrogen levels begin to drop (decrease). Estrogen is a female hormone. It helps to keep the tissues of the vagina moist. It stimulates the vagina to produce a clear fluid that lubricates the vagina for sexual intercourse. This fluid also protects the vagina from infection. Lack of estrogen can cause the lining of the vagina to get thinner and dryer. The vagina may also shrink in size. It may become less elastic. Atrophic vaginitis tends to get worse over time as a woman's estrogen level drops. What are the causes? This condition is caused by the normal drop in estrogen that happens around the time of menopause. What increases the risk? Certain conditions or situations may lower a woman's estrogen level, leading to a higher risk for atrophic vaginitis. You are more likely to develop this condition if:  You are taking medicines that block estrogen.  You have had your ovaries removed.  You are being treated for cancer with X-ray (radiation) or medicines (chemotherapy).  You have given birth or are breastfeeding.  You are older than age 21.  You smoke. What are the signs or symptoms? Symptoms of this condition include:  Pain, soreness, or bleeding during sexual intercourse (dyspareunia).  Vaginal burning, irritation, or itching.  Pain or bleeding when a speculum is used in a vaginal exam (pelvic exam).  Having burning pain when passing urine.  Vaginal discharge that is brown or yellow. In some cases, there are no symptoms. How is this diagnosed? This condition is diagnosed by taking a medical history and doing a physical exam. This  will include a pelvic exam that checks the vaginal tissues. Though rare, you may also have other tests, including:  A urine test.  A test that checks the acid balance in your vagina (acid balance test). How is this treated? Treatment for this condition depends on how severe your symptoms are. Treatment may include:  Using an over-the-counter vaginal lubricant before sex.  Using a long-acting vaginal moisturizer.  Using low-dose vaginal estrogen for moderate to severe symptoms that do not respond to other treatments. Options include creams, tablets, and inserts (vaginal rings). Before you use a vaginal estrogen, tell your health care provider if you have a history of: ? Breast cancer. ? Endometrial cancer. ? Blood clots. If you are not sexually active and your symptoms are very mild, you may not need treatment. Follow these instructions at home: Medicines  Take over-the-counter and prescription medicines only as told by your health care provider. Do not use herbal or alternative medicines unless your health care provider says that you can.  Use over-the-counter creams, lubricants, or moisturizers for dryness only as directed by your health care provider. General instructions  If your atrophic vaginitis is caused by menopause, discuss all of your menopause symptoms and treatment options with your health care provider.  Do not douche.  Do not use products that can make your vagina dry. These include: ? Scented feminine sprays. ? Scented tampons. ? Scented soaps.  Vaginal intercourse can help to improve blood flow and elasticity of vaginal tissue. If it hurts to have sex, try using a lubricant or moisturizer just before having intercourse. Contact  a health care provider if:  Your discharge looks different than normal.  Your vagina has an unusual smell.  You have new symptoms.  Your symptoms do not improve with treatment.  Your symptoms get worse. Summary  Atrophic  vaginitis is a condition in which the tissues that line the vagina become dry and thin. It is most common in women who have stopped having regular menstrual periods (are in menopause).  Treatment options include using vaginal lubricants and low-dose vaginal estrogen.  Contact a health care provider if your vagina has an unusual smell, or if your symptoms get worse or do not improve after treatment. This information is not intended to replace advice given to you by your health care provider. Make sure you discuss any questions you have with your health care provider. Document Revised: 10/08/2017 Document Reviewed: 07/22/2017 Elsevier Patient Education  2020 [employer].

## 2020-09-19 ENCOUNTER — Encounter: Payer: Self-pay | Admitting: Family Medicine

## 2020-09-19 LAB — URINE CULTURE
MICRO NUMBER:: 11185576
SPECIMEN QUALITY:: ADEQUATE

## 2020-10-25 ENCOUNTER — Other Ambulatory Visit (HOSPITAL_COMMUNITY): Payer: Self-pay | Admitting: *Deleted

## 2020-10-25 MED ORDER — VALSARTAN 80 MG PO TABS
80.0000 mg | ORAL_TABLET | Freq: Two times a day (BID) | ORAL | 0 refills | Status: DC
Start: 2020-10-25 — End: 2020-12-02

## 2020-11-04 ENCOUNTER — Encounter: Payer: Self-pay | Admitting: Obstetrics & Gynecology

## 2020-11-04 ENCOUNTER — Other Ambulatory Visit: Payer: Self-pay

## 2020-11-04 ENCOUNTER — Ambulatory Visit (INDEPENDENT_AMBULATORY_CARE_PROVIDER_SITE_OTHER): Payer: Medicare Other | Admitting: Obstetrics & Gynecology

## 2020-11-04 VITALS — BP 160/67 | HR 70 | Wt 145.0 lb

## 2020-11-04 DIAGNOSIS — R3989 Other symptoms and signs involving the genitourinary system: Secondary | ICD-10-CM | POA: Diagnosis not present

## 2020-11-04 DIAGNOSIS — N814 Uterovaginal prolapse, unspecified: Secondary | ICD-10-CM | POA: Diagnosis not present

## 2020-11-04 LAB — POCT URINALYSIS DIPSTICK
Glucose, UA: NEGATIVE
Ketones, UA: NEGATIVE
Nitrite, UA: NEGATIVE
Protein, UA: NEGATIVE
Spec Grav, UA: 1.01 (ref 1.010–1.025)
Urobilinogen, UA: 0.2 E.U./dL
pH, UA: 6.5 (ref 5.0–8.0)

## 2020-11-04 NOTE — Progress Notes (Signed)
History:  74 y.o. G2P2002 here today for eval of pain in her bladder. Pt was using the EES cream but, she was concerned that it was causing pain in her uterus. She was seen by urology but, told that she should see the GYN doc. She denies new sx since her last visit. She reports that the pain is present in the am when she wakes but, resolves upon emptying her bladder. If her bladder gets too full she feels the pain during the day. She also reports pain in the rectum if she has to have a BM but, otherwise no sx.   The following portions of the patient's history were reviewed and updated as appropriate: allergies, current medications, past family history, past medical history, past social history, past surgical history and problem list.  Review of Systems:  Pertinent items are noted in HPI.    Objective:  Physical Exam Blood pressure (!) 160/67, pulse 70, weight 145 lb (65.8 kg).  CONSTITUTIONAL: Well-developed, well-nourished female in no acute distress.  HENT:  Normocephalic, atraumatic EYES: Conjunctivae and EOM are normal. No scleral icterus.  NECK: Normal range of motion SKIN: Skin is warm and dry. No rash noted. Not diaphoretic.No pallor. [place]: Alert and oriented to person, place, and time. Normal coordination.  Abd: Soft, nontender and nondistended; no suprapubic tenderness.  Pelvic: Normal appearing external genitalia; atropic introitus and ext genitalia but otherwise normal appearing vaginal mucosa and cervix.  Normal discharge.  Small uterus with some decensus and grade I-II prolapse. No other palpable masses, no uterine or adnexal tenderness  UA: leuk; large WBCs  Assessment & Plan:  Pelvic pain and mild prolapse- it is possible that the pts sx are related to atrophy. It is also possible that it is related to the prolapse. Will send the urine for cx. Pt will restart the EES cream.  If all of her other eval or are neg, may consider a pessary. Can discuss this with pt on her next  visit.   Total face-to-face time with patient was 35 min.  Greater than 50% was spent in counseling and coordination of care with the patient.   Clayten Allcock L. Harraway-Smith, M.D., [place]lynn June

## 2020-11-06 LAB — URINE CULTURE

## 2020-11-27 ENCOUNTER — Other Ambulatory Visit (HOSPITAL_COMMUNITY): Payer: Self-pay | Admitting: *Deleted

## 2020-11-27 MED ORDER — APIXABAN 5 MG PO TABS: 5.0000 mg | ORAL_TABLET | Freq: Two times a day (BID) | ORAL | 0 refills | Status: DC

## 2020-12-02 ENCOUNTER — Other Ambulatory Visit (HOSPITAL_COMMUNITY): Payer: Self-pay | Admitting: *Deleted

## 2020-12-02 MED ORDER — VALSARTAN 80 MG PO TABS: 80.0000 mg | ORAL_TABLET | Freq: Two times a day (BID) | ORAL | 3 refills | Status: DC

## 2020-12-02 MED ORDER — VALSARTAN 80 MG PO TABS: 80.0000 mg | ORAL_TABLET | Freq: Two times a day (BID) | ORAL | 0 refills | Status: DC

## 2020-12-13 ENCOUNTER — Ambulatory Visit: Payer: Medicare Other | Admitting: Obstetrics & Gynecology

## 2020-12-18 ENCOUNTER — Encounter: Payer: Self-pay | Admitting: Family Medicine

## 2020-12-23 ENCOUNTER — Ambulatory Visit: Payer: Medicare Other | Admitting: Obstetrics & Gynecology

## 2020-12-26 ENCOUNTER — Ambulatory Visit (HOSPITAL_BASED_OUTPATIENT_CLINIC_OR_DEPARTMENT_OTHER): Payer: Medicare Other

## 2020-12-26 ENCOUNTER — Other Ambulatory Visit (HOSPITAL_BASED_OUTPATIENT_CLINIC_OR_DEPARTMENT_OTHER): Payer: Medicare Other

## 2021-01-07 ENCOUNTER — Ambulatory Visit (HOSPITAL_BASED_OUTPATIENT_CLINIC_OR_DEPARTMENT_OTHER): Payer: Medicare Other

## 2021-03-01 IMAGING — US US PELVIS COMPLETE WITH TRANSVAGINAL
2 series · 13 of 25 positions shown · non-contrast
Comparison: None

CLINICAL DATA: Pelvic pressure and discomfort, postmenopausal



[Series 1: us pelvis complete with transvaginal · 4 of 14 slices shown (1 of 2)]
[im 1/14]
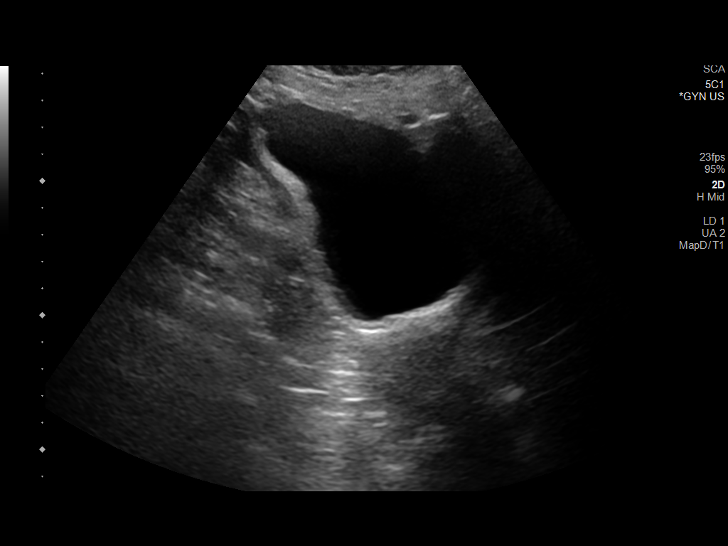
[im 5/14]
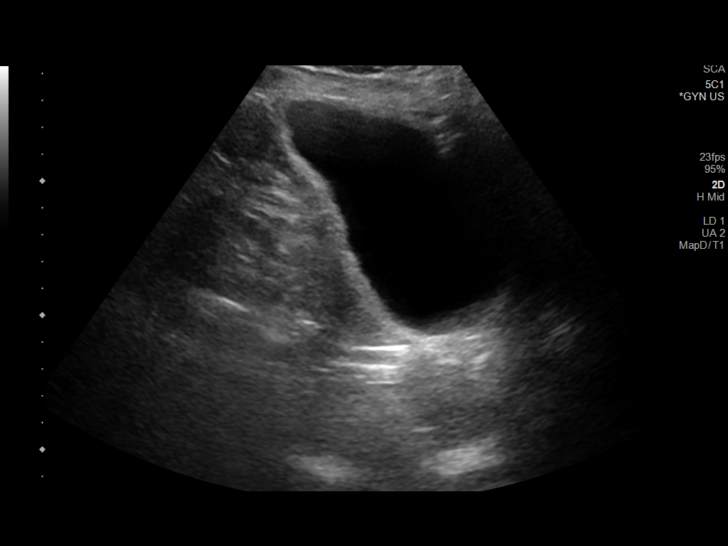
[im 9/14]
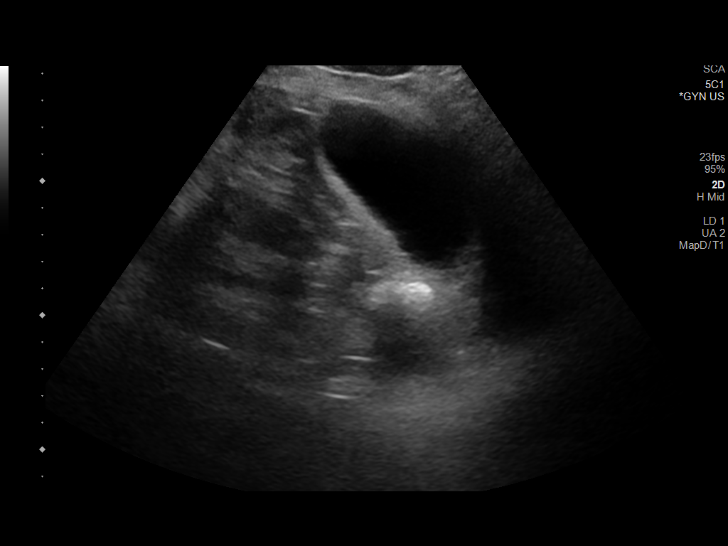
[im 14/14]
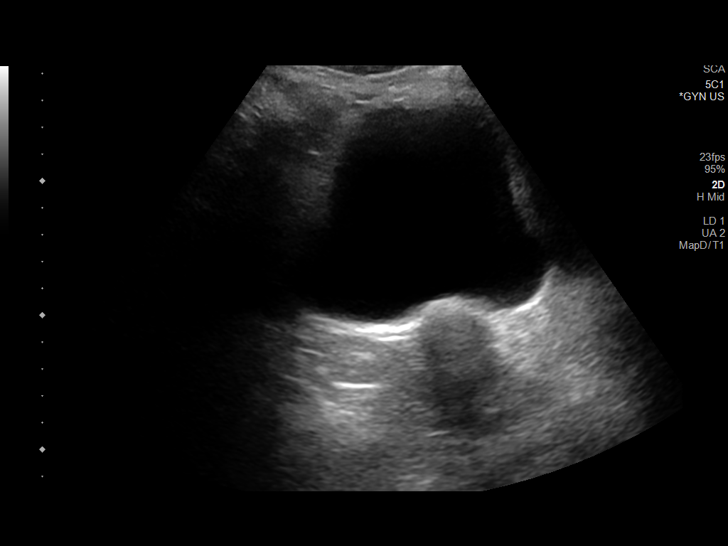

[Series 2: us pelvis complete with transvaginal · 9 of 33 slices shown (2 of 2)]
[im 2/33]
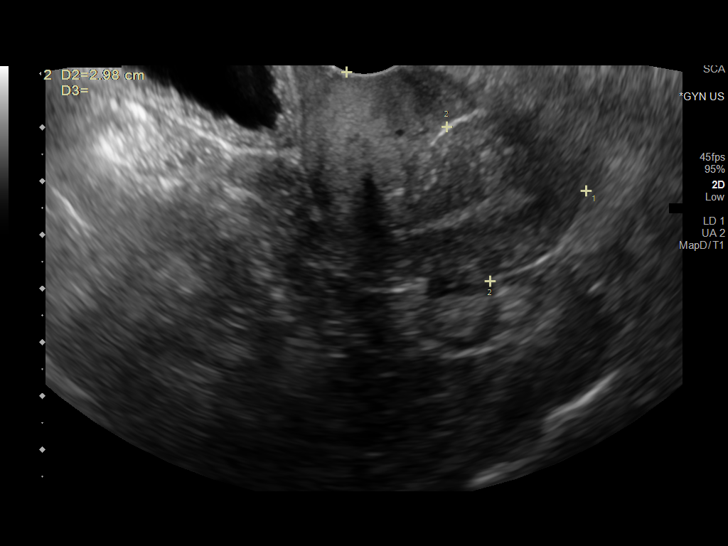
[im 6/33]
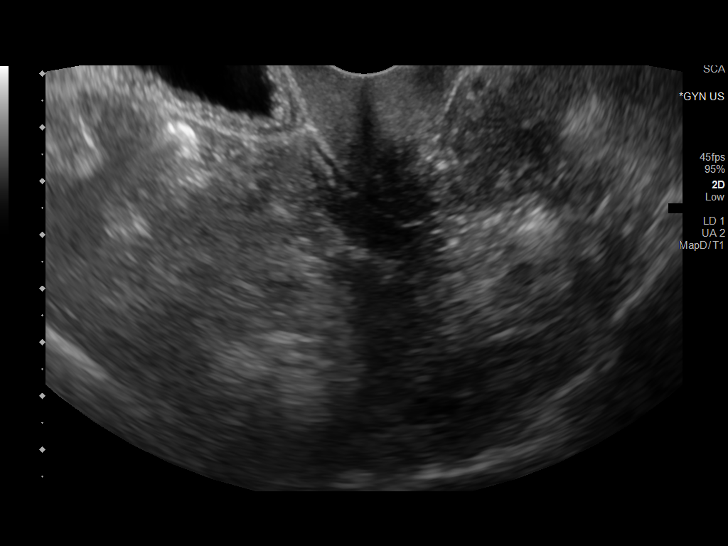
[im 10/33]
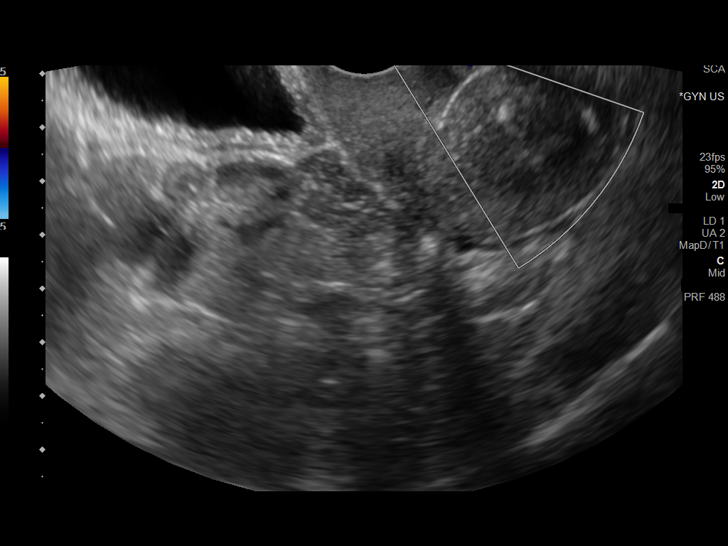
[im 14/33]
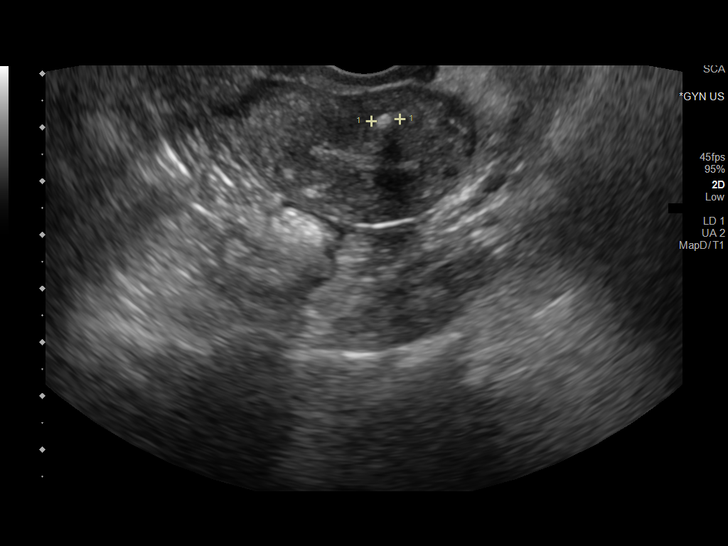
[im 17/33]
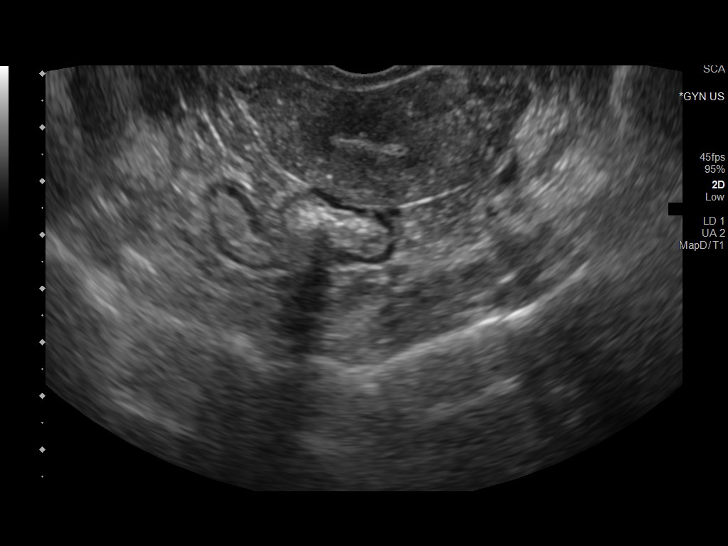
[im 21/33]
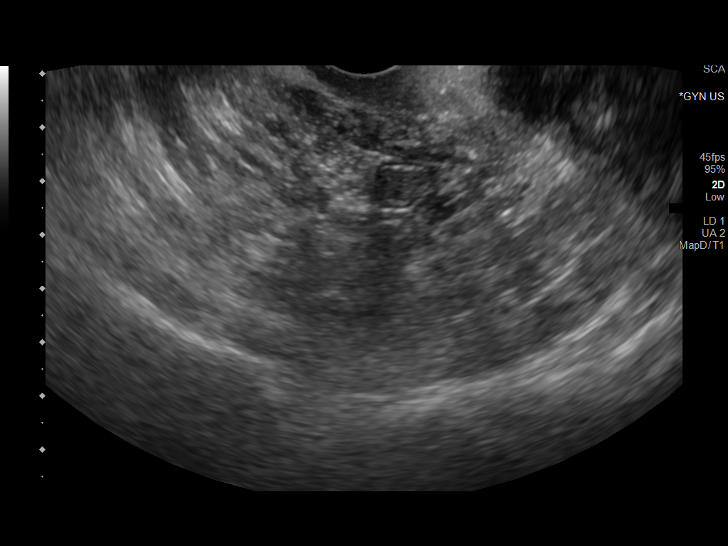
[im 25/33]
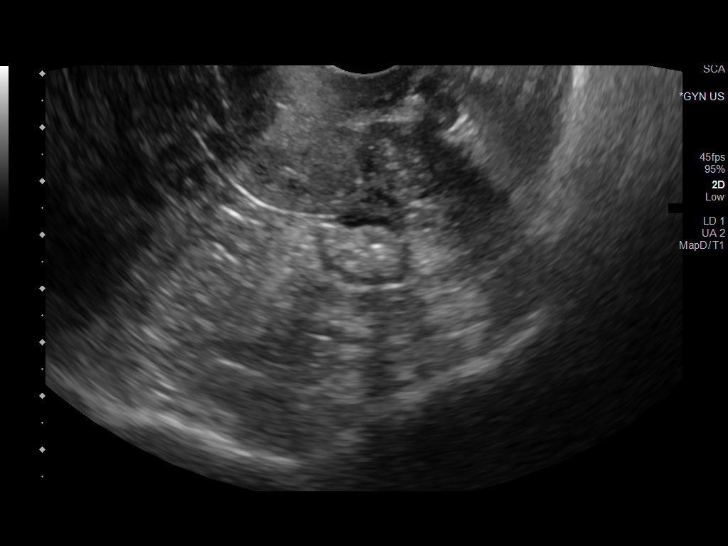
[im 29/33]
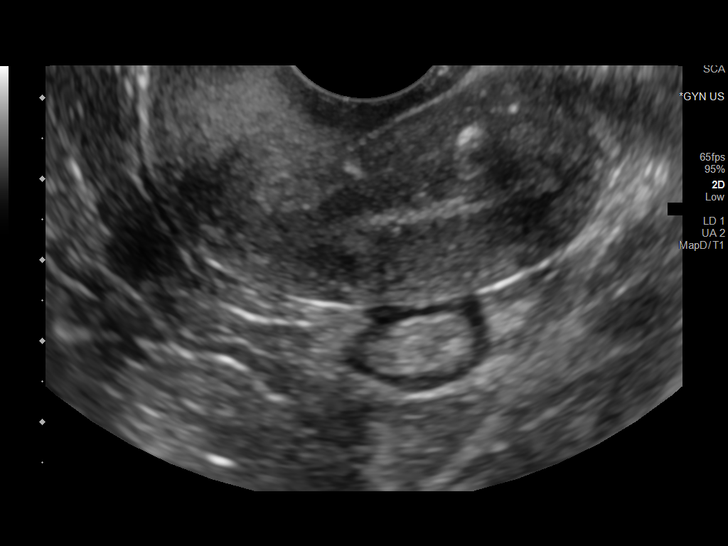
[im 33/33]
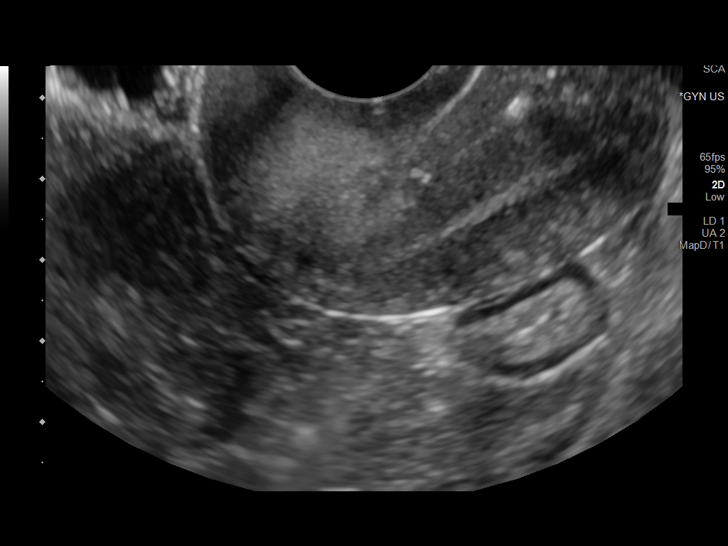

[13 of 25 positions shown; findings below may reference images not displayed]

FINDINGS: Uterus

Measurements: 5.0 x 3.0 x 4.6 cm = volume: 36 mL. Heterogeneous
myometrium. Retroflexed. Parenchymal calcification at posterior
upper uterus question small calcified leiomyoma 6 mm diameter. No
additional masses.

Endometrium

Thickness: 5 mm.  No endometrial fluid or focal abnormality

Right ovary

Not visualized, likely obscured by bowel

Left ovary

Not visualized, likely obscured by bowel

Other findings

No free pelvic fluid.  No adnexal masses.
IMPRESSION: Nonvisualization of ovaries.

Question small 6 mm calcified leiomyoma at posterior upper uterus.

Remainder of exam unremarkable.

## 2021-05-09 ENCOUNTER — Other Ambulatory Visit (HOSPITAL_COMMUNITY): Payer: Self-pay | Admitting: *Deleted

## 2021-05-09 MED ORDER — DOFETILIDE 500 MCG PO CAPS: 500.0000 ug | ORAL_CAPSULE | Freq: Two times a day (BID) | ORAL | 1 refills | Status: DC

## 2021-05-21 ENCOUNTER — Other Ambulatory Visit (HOSPITAL_COMMUNITY): Payer: Self-pay | Admitting: *Deleted

## 2021-05-21 MED ORDER — DOFETILIDE 500 MCG PO CAPS: 500.0000 ug | ORAL_CAPSULE | Freq: Two times a day (BID) | ORAL | 0 refills | Status: DC

## 2021-05-23 ENCOUNTER — Other Ambulatory Visit (HOSPITAL_COMMUNITY): Payer: Self-pay | Admitting: *Deleted

## 2021-05-23 MED ORDER — DOFETILIDE 500 MCG PO CAPS: 500.0000 ug | ORAL_CAPSULE | Freq: Two times a day (BID) | ORAL | 0 refills | Status: DC

## 2021-09-04 ENCOUNTER — Other Ambulatory Visit (HOSPITAL_COMMUNITY): Payer: Self-pay | Admitting: Cardiology

## 2021-09-04 ENCOUNTER — Ambulatory Visit: Payer: Medicare Other

## 2021-09-18 ENCOUNTER — Ambulatory Visit: Payer: Medicare Other

## 2021-09-23 ENCOUNTER — Ambulatory Visit: Payer: Medicare Other

## 2021-09-23 NOTE — Progress Notes (Deleted)
Subjective:   Maria Arnold is a 75 y.o. female who presents for an Initial Medicare Annual Wellness Visit.  I connected with  *** today by a video enabled telemedicine application and verified that I am speaking with the correct person using two identifiers.  Location of patient:*** Location of provider:Work  Persons participating in the virtual visit: patient, nurse.   I discussed the limitations, risk, security and privacy concerns of evaluation and management by telemedicine. The patient expressed understanding and agreed to proceed.  Some vital signs may be absent or patient reported.   Review of Systems    ***       Objective:    There were no vitals filed for this visit. There is no height or weight on file to calculate BMI.  Advanced Directives 07/06/2018 07/17/2016 07/15/2016 02/12/2015 02/12/2015 09/22/2012 09/21/2012  Does Patient Have a Medical Advance Directive? No No No Yes Yes Patient has advance directive, copy not in chart Patient has advance directive, copy not in chart  Type of Advance Directive - - - - Healthcare Power of [place];Living will - [place];Living will  Does patient want to make changes to medical advance directive? - - - No - Patient declined - - -  Copy of [place] in Chart? - - - - No - copy requested Copy requested from family -  Would patient like information on creating a medical advance directive? No - Patient declined - - - - - -  Pre-existing out of facility DNR order (yellow form or pink MOST form) - - - - - No -    Current Medications (verified) Outpatient Encounter Medications as of 09/23/2021  Medication Sig   amLODipine (NORVASC) 2.5 MG tablet Take 1 tablet (2.5 mg total) by mouth daily. (Patient not taking: Reported on 11/04/2020)   ascorbic acid (VITAMIN C) 500 MG tablet Take 500 mg by mouth daily. (Patient not taking: Reported on 11/04/2020)   carboxymethylcellulose (REFRESH PLUS) 0.5 % SOLN  Apply 1-2 drops to eye 3 (three) times daily as needed (dry eyes). (Patient not taking: Reported on 11/04/2020)   dofetilide (TIKOSYN) 500 MCG capsule Take 1 capsule (500 mcg total) by mouth 2 (two) times daily.   ELIQUIS 5 MG TABS tablet TAKE ONE TABLET BY MOUTH TWICE A DAY   ESTRACE VAGINAL 0.1 MG/GM vaginal cream Place 1 g vaginally daily. For 2 weeks then 3 times per week. (Patient not taking: Reported on 11/04/2020)   magnesium oxide (MAG-OX) 400 MG tablet Take 400 mg by mouth daily. (Patient not taking: Reported on 11/04/2020)   Multiple Vitamin (MULTI-VITAMIN) tablet Take 1 tablet by mouth daily. (Patient not taking: Reported on 11/04/2020)   UNABLE TO FIND Take by mouth daily. Med Name: 1 teaspoon Magnesium (Patient not taking: Reported on 11/04/2020)   UNABLE TO FIND Take 3,000 mg by mouth daily. Med Name:3000 mg b12 throat lasagne (Patient not taking: Reported on 11/04/2020)   valsartan (DIOVAN) 80 MG tablet Take 1 tablet (80 mg total) by mouth 2 (two) times daily.   No facility-administered encounter medications on file as of 09/23/2021.    Allergies (verified) Cephalexin and Lisinopril   History: Past Medical History:  Diagnosis Date   Asthma    Beta-blocker intolerance    Foot pain    From bone spur   History of narrow angle glaucoma    HTN (hypertension)    NICM (nonischemic cardiomyopathy) ([place])    a. EF 30-35% in 2012 ->  [facility]HC (2/12): No coronary disease, Cardiomyopathy felt either tachycardia-mediated or Takotsubo (emotional shock the week before hospitalization). b. f/u echo 05/2011 with EF 55-60%, normal RV, normal valves.    Paroxysmal atrial fibrillation ([facility])    a. AF ablation x 2. b. Previously on Tikosyn.   Past Surgical History:  Procedure Laterality Date   ATRIAL FIBRILLATION ABLATION N/A 09/22/2012   Procedure: ATRIAL FIBRILLATION ABLATION;  Surgeon: [name], MD;  Location: [facility] CATH LAB;  Service: Cardiovascular;  Laterality: N/A;   ATRIAL FIBRILLATION  ABLATION  02/12/2015   ATRIAL FIBRILLATION ABLATION N/A 02/12/2015   Procedure: ATRIAL FIBRILLATION ABLATION;  Surgeon: [name], MD;  [facility] CATH LAB;  Service: Cardiovascular;  Laterality: N/A;   CARDIAC CATHETERIZATION  12/2010   CARDIOVERSION N/A 07/17/2016   Procedure: CARDIOVERSION;  Surgeon: Thayer Headings, MD;  Location: [place];  Service: Cardiovascular;  Laterality: N/A;   DILATION AND CURETTAGE OF UTERUS     LAPAROSCOPIC ABDOMINAL EXPLORATION  1980's   NASAL SINUS SURGERY  1980's   TEE WITHOUT CARDIOVERSION  09/21/2012   Procedure: TRANSESOPHAGEAL ECHOCARDIOGRAM (TEE);  Surgeon: Thayer Headings, MD;  Location: [place];  Service: Cardiovascular;  Laterality: N/A;   TEE WITHOUT CARDIOVERSION N/A 02/12/2015   Procedure: TRANSESOPHAGEAL ECHOCARDIOGRAM (TEE);  Surgeon: Josue Hector, MD;  Location: [facility] ENDOSCOPY;  Service: Cardiovascular;  Laterality: N/A;   TONSILLECTOMY AND ADENOIDECTOMY  1953   Family History  Problem Relation Age of Onset   Prostate cancer Father    Skin cancer Father    Emphysema Mother    Coronary artery disease Neg Hx        Premature   Social History   Socioeconomic History   Marital status: Married    Spouse name: Not on file   Number of children: 2   Years of education: Not on file   Highest education level: Not on file  Occupational History    Employer: OTHER  Tobacco Use   Smoking status: Former    Years: 11.00    Types: Cigarettes   Smokeless tobacco: Never   Tobacco comments:    "quit smoking in 1976"  Substance and Sexual Activity   Alcohol use: Yes    Alcohol/week: 7.0 standard drinks    Types: 7 Glasses of wine per week   Drug use: No   Sexual activity: Yes  Other Topics Concern   Not on file  Social History Narrative   Lives in [place] with husband.  2 children, 5 grandchildren.  Nonsmoker, occasional ETOH.    Social Determinants of Health   Financial Resource Strain: Not on file  Food Insecurity: Not on  file  Transportation Needs: Not on file  Physical Activity: Not on file  Stress: Not on file  Social Connections: Not on file    Tobacco Counseling Counseling given: Not Answered Tobacco comments: "quit smoking in 1976"   Clinical Intake:                 Diabetic?No         Activities of Daily Living No flowsheet data found.  Patient Care Team: Copland, Gay Filler, MD as PCP - General (Family Medicine) Larey Dresser, MD as PCP - Advanced Heart Failure (Cardiology)  Indicate any recent Medical Services you may have received from other than [facility] providers in the past year (date may be approximate).     Assessment:   This is a routine wellness examination for [place].  Hearing/Vision screen No results  found.  Dietary issues and exercise activities discussed:     Goals Addressed   None    Depression Screen No flowsheet data found.  Fall Risk No flowsheet data found.  FALL RISK PREVENTION PERTAINING TO THE HOME:  Any stairs in or around the home? {YES/NO:21197} If so, are there any without handrails? {YES/NO:21197} Home free of loose throw rugs in walkways, pet beds, electrical cords, etc? {YES/NO:21197} Adequate lighting in your home to reduce risk of falls? {YES/NO:21197}  ASSISTIVE DEVICES UTILIZED TO PREVENT FALLS:  Life alert? {YES/NO:21197} Use of a cane, walker or w/c? {YES/NO:21197} Grab bars in the bathroom? {YES/NO:21197} Shower chair or bench in shower? {YES/NO:21197} Elevated toilet seat or a handicapped toilet? {YES/NO:21197}  TIMED UP AND GO:  Was the test performed? {YES/NO:21197}.  Length of time to ambulate 10 feet: *** sec.   {Appearance of PXTG:6269485}  Cognitive Function:        Immunizations Immunization History  Administered Date(s) Administered   Fluad Quad(high Dose 65+) 09/18/2020   Influenza,inj,Quad PF,6+ Mos 10/25/2013   PFIZER(Purple Top)SARS-COV-2 Vaccination 11/29/2019, 12/21/2019, 08/28/2020   Pfizer  Covid-19 Vaccine Bivalent Booster 42yrs & up 08/29/2021   Td 12/05/2012    TDAP status: Up to date  {Flu Vaccine status:2101806}  {Pneumococcal vaccine status:2101807}  Covid-19 vaccine status: Completed vaccines  Qualifies for Shingles Vaccine? Yes   Zostavax completed No   Shingrix Completed?: No.    Education has been provided regarding the importance of this vaccine. Patient has been advised to call insurance company to determine out of pocket expense if they have not yet received this vaccine. Advised may also receive vaccine at local pharmacy or Health Dept. Verbalized acceptance and understanding.  Screening Tests Health Maintenance  Topic Date Due   Pneumonia Vaccine 22+ Years old (1 - PCV) Never done   Hepatitis C Screening  Never done   Zoster Vaccines- Shingrix (1 of 2) Never done   DEXA SCAN  Never done   INFLUENZA VACCINE  06/09/2021   TETANUS/TDAP  12/05/2022   COLONOSCOPY (Pts 45-20yrs Insurance coverage will need to be confirmed)  07/04/2030   COVID-19 Vaccine  Completed   HPV VACCINES  Aged Out    Health Maintenance  Health Maintenance Due  Topic Date Due   Pneumonia Vaccine 37+ Years old (1 - PCV) Never done   Hepatitis C Screening  Never done   Zoster Vaccines- Shingrix (1 of 2) Never done   DEXA SCAN  Never done   INFLUENZA VACCINE  06/09/2021    Colorectal cancer screening: No longer required.   {Mammogram status:21018020}  {Bone Density status:21018021}  Lung Cancer Screening: (Low Dose CT Chest recommended if Age 70-80 years, 30 pack-year currently smoking OR have quit w/in 15years.) does not qualify.    Additional Screening:  Hepatitis C Screening: does qualify; Completed ***  Vision Screening: Recommended annual ophthalmology exams for early detection of glaucoma and other disorders of the eye. Is the patient up to date with their annual eye exam?  {YES/NO:21197} Who is the provider or what is the name of the office in which the patient  attends annual eye exams? *** If pt is not established with a provider, would they like to be referred to a provider to establish care? {YES/NO:21197}.   Dental Screening: Recommended annual dental exams for proper oral hygiene  Community Resource Referral / Chronic Care Management: CRR required this visit?  {YES/NO:21197}  CCM required this visit?  {YES/NO:21197}     Plan:  I have personally reviewed and noted the following in the patient's chart:   Medical and social history Use of alcohol, tobacco or illicit drugs  Current medications and supplements including opioid prescriptions. {Opioid Prescriptions:[phone]} Functional ability and status Nutritional status Physical activity Advanced directives List of other physicians Hospitalizations, surgeries, and ER visits in previous 12 months Vitals Screenings to include cognitive, depression, and falls Referrals and appointments  In addition, I have reviewed and discussed with patient certain preventive protocols, quality metrics, and best practice recommendations. A written personalized care plan for preventive services as well as general preventive health recommendations were provided to patient.   Due to this being a *** visit, the after visit summary with patients personalized plan was offered to patient via mail or my-chart. ***Patient declined at this time./ Patient would like to access on my-chart/ per request, patient was mailed a copy of AVS./ Patient preferred to pick up at office at next visit.   Marta Antu, LPN   [id number]  Nurse Health Advisor  Nurse Notes: ***

## 2021-10-10 ENCOUNTER — Telehealth: Payer: Self-pay

## 2021-10-10 NOTE — Telephone Encounter (Signed)
Transition Care Management Follow-up Telephone Call Date of discharge and from where: 09/30/2021  [facility] How have you been since you were released from the hospital? I have been constipated Any questions or concerns? No  Items Reviewed: Did the pt receive and understand the discharge instructions provided? Yes  Medications obtained and verified? Yes  Other? No  Any new allergies since your discharge? No  Dietary orders reviewed? No Do you have support at home? Yes   Home Care and Equipment/Supplies: Were home health services ordered? not applicable If so, what is the name of the agency?  Has the agency set up a time to come to the patient's home? not applicable Were any new equipment or medical supplies ordered?  No What is the name of the medical supply agency?  Were you able to get the supplies/equipment? not applicable Do you have any questions related to the use of the equipment or supplies? No  Functional Questionnaire: (I = Independent and D = Dependent) ADLs: I  Bathing/Dressing- I  Meal Prep- I  Eating- I  Maintaining continence- I  Transferring/Ambulation- I  Managing Meds- I  Follow up appointments reviewed:  PCP Hospital f/u appt confirmed? No   Specialist Hospital f/u appt confirmed? Yes  Are transportation arrangements needed? No  If their condition worsens, is the pt aware to call PCP or go to the Emergency Dept.? Yes Was the patient provided with contact information for the PCP's office or ED? Yes Was to pt encouraged to call back with questions or concerns? Yes  Patient with many questions about bowels.  I encouraged patient to be as active as possible and stay hydrated. Encouraged patient to call surgeon for direction on miralax and metamucil  Tomasa Rand, RN, BSN, [facility] [employer] [phone]

## 2021-10-14 ENCOUNTER — Other Ambulatory Visit (HOSPITAL_COMMUNITY): Payer: Self-pay | Admitting: Cardiology

## 2021-10-16 ENCOUNTER — Other Ambulatory Visit (HOSPITAL_COMMUNITY): Payer: Self-pay | Admitting: *Deleted

## 2021-10-16 MED ORDER — APIXABAN 5 MG PO TABS: 5.0000 mg | ORAL_TABLET | Freq: Two times a day (BID) | ORAL | 3 refills | Status: DC

## 2021-10-16 MED ORDER — VALSARTAN 80 MG PO TABS: 80.0000 mg | ORAL_TABLET | Freq: Two times a day (BID) | ORAL | 3 refills | Status: DC

## 2021-11-18 ENCOUNTER — Encounter (HOSPITAL_COMMUNITY): Payer: Medicare Other | Admitting: Cardiology

## 2021-12-19 ENCOUNTER — Encounter (HOSPITAL_COMMUNITY): Payer: Medicare Other | Admitting: Cardiology

## 2022-01-06 ENCOUNTER — Encounter (HOSPITAL_COMMUNITY): Payer: Medicare Other | Admitting: Cardiology

## 2022-02-04 ENCOUNTER — Encounter (HOSPITAL_COMMUNITY): Payer: Medicare Other | Admitting: Cardiology

## 2022-03-02 ENCOUNTER — Other Ambulatory Visit (HOSPITAL_COMMUNITY): Payer: Self-pay | Admitting: *Deleted

## 2022-03-02 MED ORDER — DOFETILIDE 250 MCG PO CAPS: 500.0000 ug | ORAL_CAPSULE | Freq: Two times a day (BID) | ORAL | 0 refills | Status: DC

## 2022-03-02 MED ORDER — DOFETILIDE 500 MCG PO CAPS: 500.0000 ug | ORAL_CAPSULE | Freq: Two times a day (BID) | ORAL | 0 refills | Status: DC

## 2022-03-02 MED ORDER — DOFETILIDE 500 MCG PO CAPS
500.0000 ug | ORAL_CAPSULE | Freq: Two times a day (BID) | ORAL | Status: DC
Start: 2022-03-02 — End: 2022-05-13

## 2022-03-31 ENCOUNTER — Ambulatory Visit (HOSPITAL_COMMUNITY)
Admission: RE | Admit: 2022-03-31 | Discharge: 2022-03-31 | Disposition: A | Discharge status: Home / Self Care | Payer: Medicare Other | Source: Ambulatory Visit | Attending: Cardiology | Admitting: Cardiology

## 2022-03-31 ENCOUNTER — Encounter (HOSPITAL_COMMUNITY): Payer: Self-pay | Admitting: Cardiology

## 2022-03-31 ENCOUNTER — Encounter (HOSPITAL_COMMUNITY): Payer: Medicare Other | Admitting: Cardiology

## 2022-03-31 VITALS — BP 160/90 | HR 127 | Wt 124.6 lb

## 2022-03-31 DIAGNOSIS — I10 Essential (primary) hypertension: Secondary | ICD-10-CM | POA: Diagnosis not present

## 2022-03-31 DIAGNOSIS — J45909 Unspecified asthma, uncomplicated: Secondary | ICD-10-CM | POA: Diagnosis not present

## 2022-03-31 DIAGNOSIS — Z7901 Long term (current) use of anticoagulants: Secondary | ICD-10-CM | POA: Diagnosis not present

## 2022-03-31 DIAGNOSIS — I480 Paroxysmal atrial fibrillation: Secondary | ICD-10-CM | POA: Diagnosis not present

## 2022-03-31 DIAGNOSIS — I5181 Takotsubo syndrome: Secondary | ICD-10-CM | POA: Insufficient documentation

## 2022-03-31 DIAGNOSIS — I5022 Chronic systolic (congestive) heart failure: Secondary | ICD-10-CM | POA: Diagnosis not present

## 2022-03-31 DIAGNOSIS — C7800 Secondary malignant neoplasm of unspecified lung: Secondary | ICD-10-CM | POA: Diagnosis not present

## 2022-03-31 DIAGNOSIS — I110 Hypertensive heart disease with heart failure: Secondary | ICD-10-CM | POA: Diagnosis present

## 2022-03-31 DIAGNOSIS — Z79899 Other long term (current) drug therapy: Secondary | ICD-10-CM | POA: Diagnosis not present

## 2022-03-31 DIAGNOSIS — E785 Hyperlipidemia, unspecified: Secondary | ICD-10-CM | POA: Diagnosis not present

## 2022-03-31 LAB — LIPID PANEL
Cholesterol: 196 mg/dL (ref 0–200)
HDL: 96 mg/dL (ref >40)
LDL Cholesterol: 85 mg/dL (ref 0–99)
Total CHOL/HDL Ratio: 2 RATIO
Triglycerides: 74 mg/dL (ref <150)
VLDL: 15 mg/dL (ref 0–40)

## 2022-03-31 LAB — BASIC METABOLIC PANEL
Anion gap: 8 (ref 5–15)
BUN: 12 mg/dL (ref 8–23)
CO2: 28 mmol/L (ref 22–32)
Calcium: 9.8 mg/dL (ref 8.9–10.3)
Chloride: 103 mmol/L (ref 98–111)
Creatinine, Ser: 0.7 mg/dL (ref 0.44–1.00)
GFR, Estimated: >60 mL/min (ref >60)
Glucose, Bld: 111 mg/dL — ABNORMAL HIGH (ref 70–99)
Potassium: 4.8 mmol/L (ref 3.5–5.1)
Sodium: 139 mmol/L (ref 135–145)

## 2022-03-31 LAB — CBC
HCT: 38.4 % (ref 36.0–46.0)
Hemoglobin: 12.6 g/dL (ref 12.0–15.0)
MCH: 34.2 pg — ABNORMAL HIGH (ref 26.0–34.0)
MCHC: 32.8 g/dL (ref 30.0–36.0)
MCV: 104.3 fL — ABNORMAL HIGH (ref 80.0–100.0)
Platelets: 204 10*3/uL (ref 150–400)
RBC: 3.68 MIL/uL — ABNORMAL LOW (ref 3.87–5.11)
RDW: 13.4 % (ref 11.5–15.5)
WBC: 4.3 10*3/uL (ref 4.0–10.5)
nRBC: 0 % (ref 0.0–0.2)

## 2022-03-31 LAB — IRON AND TIBC
Iron: 107 ug/dL (ref 28–170)
Saturation Ratios: 20 % (ref 10.4–31.8)
TIBC: 528 ug/dL — ABNORMAL HIGH (ref 250–450)
UIBC: 421 ug/dL

## 2022-03-31 LAB — BRAIN NATRIURETIC PEPTIDE: B Natriuretic Peptide: 248 pg/mL — ABNORMAL HIGH (ref 0.0–100.0)

## 2022-03-31 LAB — FERRITIN: Ferritin: 68 ng/mL (ref 11–307)

## 2022-03-31 NOTE — Patient Instructions (Signed)
There has been no changes to your medications.  Labs done today, your results will be available in MyChart, we will contact you for abnormal readings.  Your physician has requested that you have an echocardiogram. Echocardiography is a painless test that uses sound waves to create images of your heart. It provides your doctor with information about the size and shape of your heart and how well your heart's chambers and valves are working. This procedure takes approximately one hour. There are no restrictions for this procedure.  Your physician recommends that you schedule a follow-up appointment in: 3 months  If you have any questions or concerns before your next appointment please send us a message through mychart or call our office at [phone].    TO LEAVE A MESSAGE FOR THE NURSE SELECT OPTION 2, PLEASE LEAVE A MESSAGE INCLUDING: YOUR NAME DATE OF BIRTH CALL BACK NUMBER REASON FOR CALL**this is important as we prioritize the call backs  YOU WILL RECEIVE A CALL BACK THE SAME DAY AS LONG AS YOU CALL BEFORE 4:00 PM  At the [facility], you and your health needs are our priority. As part of our continuing mission to provide you with exceptional heart care, we have created designated Provider Care Teams. These Care Teams include your primary Cardiologist (physician) and Advanced Practice Providers (APPs- Physician Assistants and Nurse Practitioners) who all work together to provide you with the care you need, when you need it.   You may see any of the following providers on your designated Care Team at your next follow up: Dr Daniel Bensimhon Dr Dalton McLean Amy Clegg, NP Brittainy Simmons, PA Jessica Milford,NP Lindsay Finch, PA Lauren Kemp, PharmD   Please be sure to bring in all your medications bottles to every appointment.    

## 2022-03-31 NOTE — Progress Notes (Signed)
Date:  03/31/2022   ID:  Maria Arnold, DOB 08-Oct-1946, MRN [id number]   Provider location: [place] [facility] Type of Visit: Established patient   PCP:  Copland, Gay Filler, MD  Cardiologist: Dr. Aundra Dubin   History of Present Illness: Maria Arnold is a 76 y.o. 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  For about a week prior to initial admission in 2/12, she had had congestion and shortness of breath. She thought she had a bad cold. She was seen by her PCP and ECG showed atrial fibrillation with RVR. She was admitted to [facility] for evaluation. CXR did not show PNA. She had wheezing consistent with a prior asthma diagnosis. She was then noted by echo to have EF 30-35% with peri-apical severe hypokinesis. LHC was done, there was no angiographic coronary disease. 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 She had suffered an emotional shock about a week prior to admission when her daughter-in-law had a miscarriage.    She underwent TEE-guided cardioversion, but this failed to convert her to NSR. She was then started on Tikosyn, which did convert her to NSR but her QTc prolonged in the setting of using moxifloxacin for acute bronchitis treatment. Moxifloxacin was stopped and the dofetilide dose was cut back to 250 micrograms two times a day. At this dose, QTc was normal.  Repeat echo done in 7/12 showed EF improved to 55-60% with normal RV and valves.  She underwent atrial fibrillation ablation in 11/13.  In 4/14, we talked about stopping Tikosyn and she got very nervous.  She actually went into atrial fibrillation in the office so I did not stop Tikosyn.   She had a re-do atrial fibrillation ablation in 4/16.    She saw Dr Rayann Heman in 3/17 and stopped Tikosyn and bisoprolol.  After stopping bisoprolol, she felt much better, no further wheezing or dyspnea.  She  went back into atrial fibrillation and was reloaded on Tikosyn and cardioverted in 9/17.   She later saw Dr Lehman Prom at [facility] and was planning on undergoing a 3rd ablation but he left [facility].    She has been somewhat sporadic about followup, which has been challenging given Tikosyn use.   Last echo in 4/22 at [facility] showed EF 60-65% with normal RV.   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  More recently, she was found to have metastatic non-small cell lung cancer.  She is currently on Tagrisso which she seems to have had a good response to so far.     She returns for followup of CHF.  She is in atrial fibrillation today with rate around 120 bpm.  She says that the AF started this morning.  Prior to today, pulse had been regular and controlled. She has occasional transient atrial fibrillation runs, maybe once a month.  She can feel palpitations but no dyspnea or chest pain.  Generally, good exercise tolerance and able to walk as far as she wants on flat ground.  No recent bronchitis/asthma flares.  She has been taking azithromycin for possible ear infection.      Labs (2/12): K 4.8, creatinine 0.67  Labs (3/12): K 4.2, creatinine 0.5  Labs (11/13): K 3.8, creatinine 0.5 Labs (4/14): K 4.1, creatinine 0.7, HCT 44.7 Labs (12/14): K 4.1, creatinine 0.6, HCT 42, TSH normal, Mg 2.1 Labs (  8/15): K 4.2, creatinine 0.5, HCT 41, thyroid indices normal Labs (4/16): K 3.8, creatinine 0.46, Mg 1.8 Labs (9/17): K 5.1, creatinine 0.5, Mg 2 Labs (2/18): LDL 101, HDL 72, hgb 13.2, K 4.6, creatinine 0.49, Mg 1.8 Labs (8/19): K 4.3, creatinine 0.54 Labs (8/21): K 4.3, creatinine 0.5, hgb 12.5 Labs (4/23): K 5.1, creatinine 0.79  ECG (personally reviewed): Atrial fibrillation with QTc 430 msec    Past Medical History:  1. Paroxysmal atrial fibrillation with rapid ventricular response. She is on coumadin and dofetilide. She has had some breakthrough atrial fib  on dofetilide. Atrial fibrillation ablation in 11/13 and redo in 4/16. Dofetilide stopped 3/17, had breakthrough atrial fibrillation after this. Reloaded with Tikosyn in 9/17 and cardioverted to NSR.  2. Cardiomyopathy: Nonischemic. LHC (2/12): No coronary disease, EF 30-35% with periapical severe hypokinesis. Echo (2/12): EF 30-35%, periapical severe hypokinesis, normal RV, mild to moderate MR. 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  Repeat echo in 7/12 showed EF 55-60%, mild biatrial enlargement, normal RV, normal valves.  - TEE (4/16) with EF 55-60%. - Echo (6/17) with EF 55-60%.  - Echo (4/22): EF 55-60%, normal RV.  3. Asthma: Beta blocker intolerance.  4. HTN  5. Foot pain from bone spur  6. Low back pain  7. Narrow angle glaucoma 8. 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  10. Non-small cell lung cancer: Metastatic.  - Treated currently with Tagrisso.   Current Outpatient Medications  Medication Sig Dispense Refill   albuterol (PROVENTIL) (2.5 MG/3ML) 0.083% nebulizer solution SMARTSIG:3 Milliliter(s) Via Nebulizer Every 6 Hours PRN     apixaban (ELIQUIS) 5 MG TABS tablet Take 1 tablet (5 mg total) by mouth 2 (two) times daily. 180 tablet 3   azithromycin (ZITHROMAX) 250 MG tablet Take two tablets on the first day and then one tablet every day after.     carboxymethylcellulose (REFRESH PLUS) 0.5 % SOLN Apply 1-2 drops to eye 3 (three) times daily as needed (dry eyes).     Cholecalciferol 50 MCG (2000 UT) TABS Take by mouth.     dofetilide (TIKOSYN) 500 MCG capsule Take 1 capsule (500 mcg total) by mouth 2 (two) times daily.     magnesium oxide (MAG-OX) 400 MG tablet Take 400 mg by mouth daily.     Multiple Vitamin (MULTI-VITAMIN) tablet Take 1 tablet by mouth daily.     osimertinib mesylate (TAGRISSO) 80 MG tablet Take 1 tablet by mouth daily.      valsartan (DIOVAN) 80 MG tablet Take 1 tablet (80 mg total) by mouth 2 (two) times daily. 180 tablet 3   No current facility-administered medications for this encounter.    Allergies:   Cephalexin and Lisinopril   Social History:  The patient  reports that she has quit smoking. Her smoking use included cigarettes. She has never used smokeless tobacco. She reports current alcohol use of about 7.0 standard drinks per week. She reports that she does not use drugs.   Family History:  The patient's family history includes Emphysema in her mother; Prostate cancer in her father; Skin cancer in her father.   ROS:  Please see the history of present illness.   All other systems are personally reviewed and negative.   Exam:   BP (!) 160/90   Pulse (!) 127   Wt 56.5 kg (124 lb 9.6 oz)   SpO2 95%  BMI 22.79 kg/m  General: NAD Neck: No JVD, no thyromegaly or thyroid nodule.  Lungs: Clear to auscultation bilaterally with normal respiratory effort. CV: Nondisplaced PMI.  Heart tachy, irregular S1/S2, no S3/S4, no murmur.  No peripheral edema.  No carotid bruit.  Normal pedal pulses.  Abdomen: Soft, nontender, no hepatosplenomegaly, no distention.  Skin: Intact without lesions or rashes.  Neurologic: Alert and oriented x 3.  Psych: Normal affect. Extremities: No clubbing or cyanosis.  HEENT: Normal.   Recent Labs: 03/31/2022: B Natriuretic Peptide 248.0; BUN 12; Creatinine, Ser 0.70; Hemoglobin 12.6; Platelets 204; Potassium 4.8; Sodium 139  Personally reviewed   Wt Readings from Last 3 Encounters:  03/31/22 56.5 kg (124 lb 9.6 oz)  11/04/20 65.8 kg (145 lb)  09/18/20 63.5 kg (140 lb)      ASSESSMENT AND PLAN:  1. Takotsubo cardiomyopathy: Resolved, EF normal on last echo in 4/22.  - She will remain on valsartan 80 mg bid. Recent BMET stable.   - She has stopped bisoprolol.  I think that she may develop dyspnea/wheezing/respiratory decompensation on beta blockers and would avoid (had  trouble with Coreg then bisoprolol).   2. Atrial fibrillation: Paroxysmal.  She has had afib ablation x 2. She stopped Tikosyn in 3/17 but restarted in 9/17 and had DCCV to NSR.  She had planned a 3rd atrial fibrillation ablation with Dr. Clyda Hurdle at [facility] but he has moved.  She went into atrial fibrillation today, but prior to today, it seems like the AF burden has been low (she feels it when it occurs.   - Continue Eliquis. CBC today.   - Continue dofetilide.  Check BMET/Mg today.  QTc acceptable on ECG.  Needs to avoid meds that give risk for QT prolongation => would not use azithromycin (stop today), in the future would use doxycycline for bronchitis, etc.    - She tends not to stay in AF long.  She will call me if she does not convert back to NSR over the next day.  3. HTN: BP is high today.  She is on valsartan.  Says BP is generally controlled.  - Follow, recheck next visit.  She was on amlodipine in past but is off now.  4. NSCLC: She is on Tagrisso for this.  This carries < 3% risk for cardiomyopathy and < 10% risk for QT prolongation.  - Need to be careful to follow QTc given Tikosyn and Tagrisso use. QTc ok today. Avoid other QT prolonging meds.  - Would obtain repeat echo (last was 1 year ago).  Will get ECG again when she returns for echo.   She will call me if she does not convert back over to NSR in the next day or so.  Followup in 3 months.   Signed, Loralie Champagne, MD  03/31/2022   [place] [address] [facility] [place] [address] [phone] (office) [phone] (fax)

## 2022-04-07 ENCOUNTER — Other Ambulatory Visit (HOSPITAL_COMMUNITY): Payer: Self-pay

## 2022-04-07 DIAGNOSIS — I5022 Chronic systolic (congestive) heart failure: Secondary | ICD-10-CM

## 2022-04-14 ENCOUNTER — Ambulatory Visit (HOSPITAL_COMMUNITY): Payer: Medicare Other

## 2022-04-15 ENCOUNTER — Encounter (HOSPITAL_COMMUNITY): Payer: Medicare Other

## 2022-04-22 ENCOUNTER — Ambulatory Visit (HOSPITAL_COMMUNITY): Payer: Medicare Other

## 2022-04-23 ENCOUNTER — Encounter (HOSPITAL_COMMUNITY): Payer: Medicare Other

## 2022-04-29 ENCOUNTER — Encounter (HOSPITAL_COMMUNITY): Payer: Medicare Other

## 2022-04-29 ENCOUNTER — Ambulatory Visit (HOSPITAL_COMMUNITY): Payer: Medicare Other

## 2022-04-30 ENCOUNTER — Ambulatory Visit (HOSPITAL_COMMUNITY): Payer: Medicare Other

## 2022-04-30 ENCOUNTER — Encounter (HOSPITAL_COMMUNITY): Payer: Medicare Other

## 2022-05-08 ENCOUNTER — Ambulatory Visit (HOSPITAL_BASED_OUTPATIENT_CLINIC_OR_DEPARTMENT_OTHER)
Admission: RE | Admit: 2022-05-08 | Discharge: 2022-05-08 | Disposition: A | Discharge status: Home / Self Care | Payer: Medicare Other | Source: Ambulatory Visit | Attending: Internal Medicine | Admitting: Internal Medicine

## 2022-05-08 ENCOUNTER — Ambulatory Visit (HOSPITAL_COMMUNITY)
Admission: RE | Admit: 2022-05-08 | Discharge: 2022-05-08 | Disposition: A | Discharge status: Home / Self Care | Payer: Medicare Other | Source: Ambulatory Visit | Attending: Family Medicine | Admitting: Family Medicine

## 2022-05-08 ENCOUNTER — Telehealth (HOSPITAL_COMMUNITY): Payer: Self-pay | Admitting: *Deleted

## 2022-05-08 VITALS — BP 144/72 | HR 80 | Resp 16

## 2022-05-08 DIAGNOSIS — I4891 Unspecified atrial fibrillation: Secondary | ICD-10-CM

## 2022-05-08 DIAGNOSIS — I5022 Chronic systolic (congestive) heart failure: Secondary | ICD-10-CM | POA: Diagnosis not present

## 2022-05-08 DIAGNOSIS — I110 Hypertensive heart disease with heart failure: Secondary | ICD-10-CM | POA: Diagnosis not present

## 2022-05-08 NOTE — Telephone Encounter (Signed)
Pt left vm stating she has a bp check and echo scheduled for today but she was prescribed flomax for her bladder and wants to know if she can take this prior to her appt. Pt said flomax can affect her bp.

## 2022-05-08 NOTE — Progress Notes (Signed)
Pt in for f/u EKG and BP check  EKG showed NSR, HR 74  BP 144/72  Pt states she is feeling well. Oncologist called her today and are switching her Tagrisso to Sotorasib 960 mg daily, she has also been started on Tamulosin, she wanted Dr Aundra Dubin to be aware of changes. She also wants to make sure tamsulosin is safe with her other meds as the pharmacist told her it may drop her BP. Advised that is a possibility, BP good today, per Dr Claris Gladden phone note ok to take, advised if has any SE (dizziness, low bp) to let us know. She is agreeable

## 2022-05-08 NOTE — Telephone Encounter (Signed)
OK to take 

## 2022-05-08 NOTE — Telephone Encounter (Signed)
Called pt no answer/left vm for return call.

## 2022-05-09 LAB — ECHOCARDIOGRAM COMPLETE
AR max vel: 2.11 cm2
AV Peak grad: 9.2 mmHg
Ao pk vel: 1.52 m/s
Area-P 1/2: 4.96 cm2
S' Lateral: 3.4 cm
Single Plane A4C EF: 40.5 %

## 2022-05-09 NOTE — Progress Notes (Signed)
ECG with NSR, QTc is ok.

## 2022-05-11 ENCOUNTER — Other Ambulatory Visit (HOSPITAL_COMMUNITY): Payer: Self-pay | Admitting: Cardiology

## 2022-07-02 ENCOUNTER — Encounter (HOSPITAL_COMMUNITY): Payer: Medicare Other | Admitting: Cardiology

## 2022-08-14 ENCOUNTER — Encounter (HOSPITAL_COMMUNITY): Payer: Medicare Other | Admitting: Cardiology

## 2022-08-18 ENCOUNTER — Telehealth: Payer: Self-pay | Admitting: *Deleted

## 2022-08-18 ENCOUNTER — Encounter: Payer: Self-pay | Admitting: *Deleted

## 2022-08-18 NOTE — Patient Outreach (Signed)
  Care Coordination   08/18/2022 Name: Maria Arnold MRN: [id number] DOB: [birth year]   Care Coordination Outreach Attempts:  An unsuccessful telephone outreach was attempted today to offer the patient information about available care coordination services as a benefit of their health plan.   Follow Up Plan:  Additional outreach attempts will be made to offer the patient care coordination information and services.   Encounter Outcome:  No Answer left voice message requesting call back  Care Coordination Interventions Activated:  No   Care Coordination Interventions:  No, not indicated unsuccessful outreach attempt # 1   Oneta Rack, RN, BSN, CCRN Alumnus RN CM Care Coordination/ Transition of [place] [facility] [phone]: direct office

## 2022-08-19 ENCOUNTER — Other Ambulatory Visit (HOSPITAL_COMMUNITY): Payer: Self-pay | Admitting: Cardiology

## 2022-08-20 ENCOUNTER — Telehealth: Payer: Self-pay | Admitting: *Deleted

## 2022-08-20 ENCOUNTER — Encounter: Payer: Self-pay | Admitting: *Deleted

## 2022-08-20 NOTE — Patient Outreach (Signed)
  Care Coordination   08/20/2022 Name: Maria Arnold MRN: [id number] DOB: 1946/09/15   Care Coordination Outreach Attempts:  A second unsuccessful outreach was attempted today to offer the patient with information about available care coordination services as a benefit of their health plan.     Follow Up Plan:  Additional outreach attempts will be made to offer the patient care coordination information and services.   Encounter Outcome:  No Answer left voice message requesting call back  Care Coordination Interventions Activated:  No   Care Coordination Interventions:  No, not indicated unsuccessful outreach attempt # 2   Oneta Rack, RN, BSN, CCRN Alumnus RN CM Care Coordination/ Transition of [place] Management [phone]: direct office

## 2022-08-21 ENCOUNTER — Encounter: Payer: Self-pay | Admitting: *Deleted

## 2022-08-21 ENCOUNTER — Telehealth: Payer: Self-pay | Admitting: *Deleted

## 2022-08-21 NOTE — Patient Outreach (Signed)
  Care Coordination   08/21/2022 Name: TYRINA HINES MRN: [id number] DOB: 1946-03-04   Care Coordination Outreach Attempts:  A third unsuccessful outreach was attempted today to offer the patient with information about available care coordination services as a benefit of their health plan.   Follow Up Plan:  No further outreach attempts will be made at this time. We have been unable to contact the patient to offer or enroll patient in care coordination services  Encounter Outcome:  No Answer unable to leave voice message requesting call back with attempt # 3  Care Coordination Interventions Activated:  No   Care Coordination Interventions:  No, not indicated unsuccessful consecutive outreach attempt # 3 without patient/ caregiver call back   Oneta Rack, RN, BSN, CCRN Alumnus RN CM Care Coordination/ Transition of [place] Management [phone]: direct office

## 2022-09-11 ENCOUNTER — Encounter (HOSPITAL_COMMUNITY): Payer: Self-pay | Admitting: Cardiology

## 2022-09-11 ENCOUNTER — Ambulatory Visit (HOSPITAL_COMMUNITY)
Admission: RE | Admit: 2022-09-11 | Discharge: 2022-09-11 | Disposition: A | Discharge status: Home / Self Care | Payer: Medicare Other | Source: Ambulatory Visit | Attending: Cardiology | Admitting: Cardiology

## 2022-09-11 VITALS — BP 150/80 | HR 72 | Wt 123.6 lb

## 2022-09-11 DIAGNOSIS — I4819 Other persistent atrial fibrillation: Secondary | ICD-10-CM | POA: Diagnosis not present

## 2022-09-11 DIAGNOSIS — R1011 Right upper quadrant pain: Secondary | ICD-10-CM | POA: Insufficient documentation

## 2022-09-11 DIAGNOSIS — I5022 Chronic systolic (congestive) heart failure: Secondary | ICD-10-CM | POA: Diagnosis present

## 2022-09-11 DIAGNOSIS — I429 Cardiomyopathy, unspecified: Secondary | ICD-10-CM | POA: Diagnosis not present

## 2022-09-11 DIAGNOSIS — Z7901 Long term (current) use of anticoagulants: Secondary | ICD-10-CM | POA: Insufficient documentation

## 2022-09-11 DIAGNOSIS — Z79899 Other long term (current) drug therapy: Secondary | ICD-10-CM | POA: Diagnosis not present

## 2022-09-11 DIAGNOSIS — I110 Hypertensive heart disease with heart failure: Secondary | ICD-10-CM | POA: Diagnosis not present

## 2022-09-11 DIAGNOSIS — C3490 Malignant neoplasm of unspecified part of unspecified bronchus or lung: Secondary | ICD-10-CM | POA: Diagnosis not present

## 2022-09-11 DIAGNOSIS — I480 Paroxysmal atrial fibrillation: Secondary | ICD-10-CM | POA: Diagnosis not present

## 2022-09-11 DIAGNOSIS — Z87891 Personal history of nicotine dependence: Secondary | ICD-10-CM | POA: Insufficient documentation

## 2022-09-11 DIAGNOSIS — J45909 Unspecified asthma, uncomplicated: Secondary | ICD-10-CM | POA: Diagnosis not present

## 2022-09-11 LAB — BASIC METABOLIC PANEL
Anion gap: 7 (ref 5–15)
BUN: 8 mg/dL (ref 8–23)
CO2: 27 mmol/L (ref 22–32)
Calcium: 9.4 mg/dL (ref 8.9–10.3)
Chloride: 105 mmol/L (ref 98–111)
Creatinine, Ser: 0.64 mg/dL (ref 0.44–1.00)
GFR, Estimated: >60 mL/min (ref >60)
Glucose, Bld: 105 mg/dL — ABNORMAL HIGH (ref 70–99)
Potassium: 4.9 mmol/L (ref 3.5–5.1)
Sodium: 139 mmol/L (ref 135–145)

## 2022-09-11 LAB — MAGNESIUM: Magnesium: 2.1 mg/dL (ref 1.7–2.4)

## 2022-09-11 NOTE — Patient Instructions (Signed)
There has been no changes to your medications.  Labs done today, your results will be available in MyChart, we will contact you for abnormal readings.  You have been referred to the Electrophysiologist. They will call you to arrange your appointment.  Your physician recommends that you schedule a follow-up appointment in: 6 months (May 2024)  ** please call the office in February to arrange your follow up appointment **  If you have any questions or concerns before your next appointment please send [place] a message through [place] or call our office at [phone].    TO LEAVE A MESSAGE FOR THE NURSE SELECT OPTION 2, PLEASE LEAVE A MESSAGE INCLUDING: YOUR NAME DATE OF BIRTH CALL BACK NUMBER REASON FOR CALL**this is important as we prioritize the call backs  YOU WILL RECEIVE A CALL BACK THE SAME DAY AS LONG AS YOU CALL BEFORE 4:00 PM  At the [facility], you and your health needs are our priority. As part of our continuing mission to provide you with exceptional heart care, we have created designated Provider Care Teams. These Care Teams include your primary Cardiologist (physician) and Advanced Practice Providers (APPs- Physician Assistants and Nurse Practitioners) who all work together to provide you with the care you need, when you need it.   You may see any of the following providers on your designated Care Team at your next follow up: Dr Glori Bickers Dr Loralie Champagne Dr. Roxana Hires, NP Lyda Jester, [place] [facility] [place], [place] Forestine Na, NP Audry Riles, PharmD   Please be sure to bring in all your medications bottles to every appointment.

## 2022-09-13 ENCOUNTER — Other Ambulatory Visit (HOSPITAL_COMMUNITY): Payer: Self-pay | Admitting: Cardiology

## 2022-09-13 NOTE — Progress Notes (Signed)
Date:  09/13/2022   ID:  Maria Arnold, DOB 1946-02-05, MRN [id number]   Provider location: [place] [facility] Type of Visit: Established patient   PCP:  Copland, Gay Filler, MD  Cardiologist: Dr. Aundra Dubin   History of Present Illness: Maria Arnold is a 76 y.o. 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  For about a week prior to initial admission in 2/12, she had had congestion and shortness of breath. She thought she had a bad cold. She was seen by her PCP and ECG showed atrial fibrillation with RVR. She was admitted to [facility] for evaluation. CXR did not show PNA. She had wheezing consistent with a prior asthma diagnosis. She was then noted by echo to have EF 30-35% with peri-apical severe hypokinesis. LHC was done, there was no angiographic coronary disease. 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 She had suffered an emotional shock about a week prior to admission when her daughter-in-law had a miscarriage.    She underwent TEE-guided cardioversion, but this failed to convert her to NSR. She was then started on Tikosyn, which did convert her to NSR but her QTc prolonged in the setting of using moxifloxacin for acute bronchitis treatment. Moxifloxacin was stopped and the dofetilide dose was cut back to 250 micrograms two times a day. At this dose, QTc was normal.  Repeat echo done in 7/12 showed EF improved to 55-60% with normal RV and valves.  She underwent atrial fibrillation ablation in 11/13.  In 4/14, we talked about stopping Tikosyn and she got very nervous.  She actually went into atrial fibrillation in the office so I did not stop Tikosyn.   She had a re-do atrial fibrillation ablation in 4/16.    She saw Dr Rayann Heman in 3/17 and stopped Tikosyn and bisoprolol.  After stopping bisoprolol, she felt much better, no further wheezing or dyspnea.  She  went back into atrial fibrillation and was reloaded on Tikosyn and cardioverted in 9/17.   She later saw Dr Lehman Prom at [facility] and was planning on undergoing a 3rd ablation but he left [facility].    She has been somewhat sporadic about followup, which has been challenging given Tikosyn use.   Echo in 4/22 at [facility] showed EF 60-65% with normal RV.   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  More recently, she was found to have metastatic non-small cell lung cancer.  She is currently on Tagrisso which she seems to have had a good response to so far.    Echo in 6/23 showed EF 55%, normal RV, PASP 38, severe LAE, moderate RAE, mild MR, dilated IVC.    She returns for followup of CHF.  She is in NSR today.  She was in atrial fibrillation in 10/23 during an admission at outside hospital for PNA.  She continues to have periodic short atrial fibrillation bouts, notes on Apple Watch.  Main complaint recently has been some abdominal discomfort after eating.  She is going to see a gastroenterologist next week.  No significant dyspnea unless she walks fast up stairs.  No chest pain.  BP is mildly elevated today but has been 120s-130s at home.     Labs (2/12): K 4.8, creatinine 0.67  Labs (3/12): K 4.2, creatinine 0.5  Labs (11/13): K 3.8, creatinine 0.5 Labs (4/14): K 4.1, creatinine  0.7, HCT 44.7 Labs (12/14): K 4.1, creatinine 0.6, HCT 42, TSH normal, Mg 2.1 Labs (8/15): K 4.2, creatinine 0.5, HCT 41, thyroid indices normal Labs (4/16): K 3.8, creatinine 0.46, Mg 1.8 Labs (9/17): K 5.1, creatinine 0.5, Mg 2 Labs (2/18): LDL 101, HDL 72, hgb 13.2, K 4.6, creatinine 0.49, Mg 1.8 Labs (8/19): K 4.3, creatinine 0.54 Labs (8/21): K 4.3, creatinine 0.5, hgb 12.5 Labs (4/23): K 5.1, creatinine 0.79 Labs (5/23): LDL 85, HDL 96  ECG (personally reviewed): NSR, QTc 437   Past Medical History:  1. Paroxysmal atrial fibrillation with rapid ventricular response.  She is on coumadin and dofetilide. She has had some breakthrough atrial fib on dofetilide. Atrial fibrillation ablation in 11/13 and redo in 4/16. Dofetilide stopped 3/17, had breakthrough atrial fibrillation after this. Reloaded with Tikosyn in 9/17 and cardioverted to NSR.  2. Cardiomyopathy: Nonischemic. LHC (2/12): No coronary disease, EF 30-35% with periapical severe hypokinesis. Echo (2/12): EF 30-35%, periapical severe hypokinesis, normal RV, mild to moderate MR. 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  Repeat echo in 7/12 showed EF 55-60%, mild biatrial enlargement, normal RV, normal valves.  - TEE (4/16) with EF 55-60%. - Echo (6/17) with EF 55-60%.  - Echo (4/22): EF 55-60%, normal RV.  - Echo (6/23): EF 55%, normal RV, PASP 38, severe LAE, moderate RAE, mild MR, dilated IVC.  3. Asthma: Beta blocker intolerance.  4. HTN  5. Foot pain from bone spur  6. Low back pain  7. Narrow angle glaucoma 8. 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  10. Non-small cell lung cancer: Metastatic.  - Treated currently with Tagrisso.   Current Outpatient Medications  Medication Sig Dispense Refill   albuterol (PROVENTIL) (2.5 MG/3ML) 0.083% nebulizer solution SMARTSIG:3 Milliliter(s) Via Nebulizer Every 6 Hours PRN     apixaban (ELIQUIS) 5 MG TABS tablet Take 1 tablet (5 mg total) by mouth 2 (two) times daily. 180 tablet 3   carboxymethylcellulose (REFRESH PLUS) 0.5 % SOLN Apply 1-2 drops to eye 3 (three) times daily as needed (dry eyes).     osimertinib mesylate (TAGRISSO) 80 MG tablet Take 80 mg by mouth daily.     tamsulosin (FLOMAX) 0.4 MG CAPS capsule Take 0.4 mg by mouth every other day.     TIKOSYN 500 MCG capsule TAKE ONE CAPSULE BY MOUTH TWICE A DAY 180 capsule 1   valsartan (DIOVAN) 80 MG tablet TAKE 1 TABLET BY MOUTH 2 TIMES DAILY. 60 tablet 0   No current  facility-administered medications for this encounter.    Allergies:   Cephalexin and Lisinopril   Social History:  The patient  reports that she has quit smoking. Her smoking use included cigarettes. She has never used smokeless tobacco. She reports current alcohol use of about 7.0 standard drinks of alcohol per week. She reports that she does not use drugs.   Family History:  The patient's family history includes Emphysema in her mother; Prostate cancer in her father; Skin cancer in her father.   ROS:  Please see the history of present illness.   All other systems are personally reviewed and negative.   Exam:   BP (!) 150/80   Pulse 72   Wt 56.1 kg (123 lb 9.6 oz)   SpO2 100%   BMI 22.61 kg/m  General: NAD Neck: JVP 7-8 cm, no thyromegaly or thyroid nodule.  Lungs: Clear to  auscultation bilaterally with normal respiratory effort. CV: Nondisplaced PMI.  Heart regular S1/S2, no S3/S4, 1/6 early SEM RUSB.  No peripheral edema.  No carotid bruit.  Normal pedal pulses.  Abdomen: Soft, nontender, no hepatosplenomegaly, no distention.  Skin: Intact without lesions or rashes.  Neurologic: Alert and oriented x 3.  Psych: Normal affect. Extremities: No clubbing or cyanosis.  HEENT: Normal.   Recent Labs: 03/31/2022: B Natriuretic Peptide 248.0; Hemoglobin 12.6; Platelets 204 09/11/2022: BUN 8; Creatinine, Ser 0.64; Magnesium 2.1; Potassium 4.9; Sodium 139  Personally reviewed   Wt Readings from Last 3 Encounters:  09/11/22 56.1 kg (123 lb 9.6 oz)  03/31/22 56.5 kg (124 lb 9.6 oz)  11/04/20 65.8 kg (145 lb)      ASSESSMENT AND PLAN:  1. Takotsubo cardiomyopathy: Resolved, EF normal on last echo in 6/23.  She does have some evidence for diastolic CHF with 1/66 echo showing mildly elevated PA pressure and dilated IVC.  Today, she does not appear significantly volume overloaded.  - She will remain on valsartan 80 mg bid. BMET today.   - She has stopped bisoprolol.  I think that she may  develop dyspnea/wheezing/respiratory decompensation on beta blockers and would avoid (had trouble with Coreg then bisoprolol).   - Will avoid adding additional medication today but will need to consider SGLT2 inhibitor in future.  2. Atrial fibrillation: Paroxysmal.  She has had afib ablation x 2. She stopped Tikosyn in 3/17 but restarted in 9/17 and had DCCV to NSR.  She had planned a 3rd atrial fibrillation ablation with Dr. Clyda Hurdle at [facility] but he has moved.  She continues to have periodic symptomatic AF episodes but remains generally in NSR.   - Continue Eliquis. CBC today.   - Continue dofetilide.  Check BMET/Mg today.  QTc acceptable on ECG.  Needs to avoid meds that give risk for QT prolongation.  - I will refer her to EP, used to see Dr. Rayann Heman but now retired.  Given symptomatic PAF, would like her considered for redo ablation.    3. HTN: BP is high today.  She is on valsartan.  Says BP is generally controlled when checked at home.  4. NSCLC: She is on Tagrisso for this.  This carries < 3% risk for cardiomyopathy and < 10% risk for QT prolongation.  - Need to be careful to follow QTc given Tikosyn and Tagrisso use. QTc ok today. Avoid other QT prolonging meds.  5. Abdominal pain: Generally RUQ after eating.  CT abdomen recently was not remarkable.  She will see GI Monday, probably need RUQ [place] to assess for gallbladder disease.   Followup 6 months.   Signed, Loralie Champagne, MD  09/13/2022   [facility] [address] [facility] [place] [address] [phone] (office) [phone] (fax)

## 2022-10-28 ENCOUNTER — Other Ambulatory Visit (HOSPITAL_COMMUNITY): Payer: Self-pay | Admitting: Cardiology

## 2023-03-22 ENCOUNTER — Ambulatory Visit (HOSPITAL_COMMUNITY)
Admission: RE | Admit: 2023-03-22 | Discharge: 2023-03-22 | Disposition: A | Discharge status: Home / Self Care | Payer: Medicare Other | Source: Ambulatory Visit | Attending: Cardiology | Admitting: Cardiology

## 2023-03-22 ENCOUNTER — Encounter (HOSPITAL_COMMUNITY): Payer: Self-pay | Admitting: Cardiology

## 2023-03-22 VITALS — BP 164/98 | HR 124 | Ht 62.0 in | Wt 110.8 lb

## 2023-03-22 DIAGNOSIS — Z79899 Other long term (current) drug therapy: Secondary | ICD-10-CM | POA: Insufficient documentation

## 2023-03-22 DIAGNOSIS — I480 Paroxysmal atrial fibrillation: Secondary | ICD-10-CM | POA: Insufficient documentation

## 2023-03-22 DIAGNOSIS — C3490 Malignant neoplasm of unspecified part of unspecified bronchus or lung: Secondary | ICD-10-CM | POA: Insufficient documentation

## 2023-03-22 DIAGNOSIS — J45909 Unspecified asthma, uncomplicated: Secondary | ICD-10-CM | POA: Diagnosis not present

## 2023-03-22 DIAGNOSIS — I110 Hypertensive heart disease with heart failure: Secondary | ICD-10-CM | POA: Insufficient documentation

## 2023-03-22 DIAGNOSIS — I5022 Chronic systolic (congestive) heart failure: Secondary | ICD-10-CM

## 2023-03-22 DIAGNOSIS — I4819 Other persistent atrial fibrillation: Secondary | ICD-10-CM | POA: Diagnosis not present

## 2023-03-22 DIAGNOSIS — Z87891 Personal history of nicotine dependence: Secondary | ICD-10-CM | POA: Insufficient documentation

## 2023-03-22 DIAGNOSIS — Z7901 Long term (current) use of anticoagulants: Secondary | ICD-10-CM | POA: Diagnosis not present

## 2023-03-22 LAB — BRAIN NATRIURETIC PEPTIDE: B Natriuretic Peptide: 329 pg/mL — ABNORMAL HIGH (ref 0.0–100.0)

## 2023-03-22 LAB — BASIC METABOLIC PANEL
Anion gap: 12 (ref 5–15)
BUN: 9 mg/dL (ref 8–23)
CO2: 26 mmol/L (ref 22–32)
Calcium: 9.3 mg/dL (ref 8.9–10.3)
Chloride: 101 mmol/L (ref 98–111)
Creatinine, Ser: 0.65 mg/dL (ref 0.44–1.00)
GFR, Estimated: >60 mL/min (ref >60)
Glucose, Bld: 97 mg/dL (ref 70–99)
Potassium: 4.1 mmol/L (ref 3.5–5.1)
Sodium: 139 mmol/L (ref 135–145)

## 2023-03-22 MED ORDER — POTASSIUM CHLORIDE ER 10 MEQ PO TBCR: 10.0000 meq | EXTENDED_RELEASE_TABLET | Freq: Every day | ORAL | 3 refills | Status: DC

## 2023-03-22 MED ORDER — METOPROLOL SUCCINATE ER 25 MG PO TB24: 25.0000 mg | ORAL_TABLET | Freq: Every day | ORAL | 3 refills | Status: DC

## 2023-03-22 MED ORDER — FUROSEMIDE 20 MG PO TABS: 20.0000 mg | ORAL_TABLET | Freq: Every day | ORAL | 3 refills | Status: DC

## 2023-03-22 MED ORDER — DAPAGLIFLOZIN PROPANEDIOL 10 MG PO TABS: 10.0000 mg | ORAL_TABLET | Freq: Every day | ORAL | 11 refills | Status: DC

## 2023-03-22 NOTE — Progress Notes (Signed)
Date:  03/22/2023   ID:  BLAYRE BUCHKO, DOB 05-25-1946, MRN [id number]   Provider location: [place] [facility] Type of Visit: Established patient   PCP:  Copland, Gwenlyn Found, MD  Cardiologist: Dr. Shirlee Latch   History of Present Illness: Michell LOURENE MULHERN is a 77 y.o. 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  For about a week prior to initial admission in 2/12, she had had congestion and shortness of breath. She thought she had a bad cold. She was seen by her PCP and ECG showed atrial fibrillation with RVR. She was admitted to [facility] for evaluation. CXR did not show PNA. She had wheezing consistent with a prior asthma diagnosis. She was then noted by echo to have EF 30-35% with peri-apical severe hypokinesis. LHC was done, there was no angiographic coronary disease. 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 She had suffered an emotional shock about a week prior to admission when her daughter-in-law had a miscarriage.    She underwent TEE-guided cardioversion, but this failed to convert her to NSR. She was then started on Tikosyn, which did convert her to NSR but her QTc prolonged in the setting of using moxifloxacin for acute bronchitis treatment. Moxifloxacin was stopped and the dofetilide dose was cut back to 250 micrograms two times a day. At this dose, QTc was normal.  Repeat echo done in 7/12 showed EF improved to 55-60% with normal RV and valves.  She underwent atrial fibrillation ablation in 11/13.  In 4/14, we talked about stopping Tikosyn and she got very nervous.  She actually went into atrial fibrillation in the office so I did not stop Tikosyn.   She had a re-do atrial fibrillation ablation in 4/16.    She saw Dr Johney Frame in 3/17 and stopped Tikosyn and bisoprolol.  After stopping bisoprolol, she felt much better, no further wheezing or dyspnea.  She  went back into atrial fibrillation and was reloaded on Tikosyn and cardioverted in 9/17.   She later saw Dr Hurman Horn at [facility] and was planning on undergoing a 3rd ablation but he left [facility].    She has been somewhat sporadic about followup, which has been challenging given Tikosyn use.   Echo in 4/22 at [facility] showed EF 60-65% with normal RV.   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  More recently, she was found to have metastatic non-small cell lung cancer.  She is currently on osimertinib and sotorasib.    Echo in 6/23 showed EF 55%, normal RV, PASP 38, severe LAE, moderate RAE, mild MR, dilated IVC.    She returns for followup of CHF.   Sotorasib has been causing GI discomfort and poor po intake.  Weight is down 13 lbs.  Last week, her oncologist sent her for IV fluid on Wednesday.  She developed significant lower extremity edema after this and she also has noted her heart racing since that time. Today, she is in atrial fibrillation with HR in 120s.  BP elevated. She has shortness of breath walking up stairs, which is unusual for her.  No dyspnea walking on flat ground.  No orthopnea/PND. No chest pain.  No lightheadedness or syncope.  I started her on Lasix 20 mg daily + KCl 10 daily on Saturday, she has taken 2 doses of each and says that her swelling has  decreased and breathing seems a bit better.    Labs (2/12): K 4.8, creatinine 0.67  Labs (3/12): K 4.2, creatinine 0.5  Labs (11/13): K 3.8, creatinine 0.5 Labs (4/14): K 4.1, creatinine 0.7, HCT 44.7 Labs (12/14): K 4.1, creatinine 0.6, HCT 42, TSH normal, Mg 2.1 Labs (8/15): K 4.2, creatinine 0.5, HCT 41, thyroid indices normal Labs (4/16): K 3.8, creatinine 0.46, Mg 1.8 Labs (9/17): K 5.1, creatinine 0.5, Mg 2 Labs (2/18): LDL 101, HDL 72, hgb 13.2, K 4.6, creatinine 0.49, Mg 1.8 Labs (8/19): K 4.3, creatinine 0.54 Labs (8/21): K 4.3, creatinine 0.5, hgb 12.5 Labs (4/23): K 5.1,  creatinine 0.79 Labs (5/23): LDL 85, HDL 96 Labs (5/24): LFTs normal, lipase not elevated, K 3.9, creatinine 0.68, hgb 10.4  ECG (personally reviewed): Atrial fibrillation rate 120, nonspecific T wave changes, QTc 486 msec   Past Medical History:  1. Paroxysmal atrial fibrillation with rapid ventricular response. She is on coumadin and dofetilide. She has had some breakthrough atrial fib on dofetilide. Atrial fibrillation ablation in 11/13 and redo in 4/16. Dofetilide stopped 3/17, had breakthrough atrial fibrillation after this. Reloaded with Tikosyn in 9/17 and cardioverted to NSR.  2. Cardiomyopathy: Nonischemic. LHC (2/12): No coronary disease, EF 30-35% with periapical severe hypokinesis. Echo (2/12): EF 30-35%, periapical severe hypokinesis, normal RV, mild to moderate MR. 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  Repeat echo in 7/12 showed EF 55-60%, mild biatrial enlargement, normal RV, normal valves.  - TEE (4/16) with EF 55-60%. - Echo (6/17) with EF 55-60%.  - Echo (4/22): EF 55-60%, normal RV.  - Echo (6/23): EF 55%, normal RV, PASP 38, severe LAE, moderate RAE, mild MR, dilated IVC.  3. Asthma: Beta blocker intolerance.  4. HTN  5. Foot pain from bone spur  6. Low back pain  7. Narrow angle glaucoma 8. 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  10. Non-small cell lung cancer: Metastatic.  - Treated currently with osimertinib and sotorasib.   Current Outpatient Medications  Medication Sig Dispense Refill   albuterol (PROVENTIL) (2.5 MG/3ML) 0.083% nebulizer solution SMARTSIG:3 Milliliter(s) Via Nebulizer Every 6 Hours PRN     dapagliflozin propanediol (FARXIGA) 10 MG TABS tablet Take 1 tablet (10 mg total) by mouth daily before breakfast. 30 tablet 11   DIOVAN 80 MG tablet TAKE ONE TABLET BY MOUTH TWICE A DAY 180 tablet 3   ELIQUIS 5 MG TABS tablet  TAKE ONE TABLET BY MOUTH TWICE A DAY 180 tablet 3   furosemide (LASIX) 20 MG tablet Take 1 tablet (20 mg total) by mouth daily. 90 tablet 3   metoprolol succinate (TOPROL XL) 25 MG 24 hr tablet Take 1 tablet (25 mg total) by mouth daily. 90 tablet 3   osimertinib mesylate (TAGRISSO) 80 MG tablet Take 80 mg by mouth daily.     potassium chloride (KLOR-CON) 10 MEQ tablet Take 1 tablet (10 mEq total) by mouth daily. 90 tablet 3   sotorasib (LUMAKRAS) 120 MG tablet Take 240 mg by mouth daily.     tamsulosin (FLOMAX) 0.4 MG CAPS capsule Take 0.4 mg by mouth every other day.     TIKOSYN 500 MCG capsule TAKE ONE CAPSULE BY MOUTH TWICE A DAY 180 capsule 1   carboxymethylcellulose (REFRESH PLUS) 0.5 % SOLN Apply 1-2 drops to eye 3 (three) times daily as needed (dry eyes). (Patient not taking: Reported on 03/22/2023)  No current facility-administered medications for this encounter.    Allergies:   Cephalexin and Lisinopril   Social History:  The patient  reports that she has quit smoking. Her smoking use included cigarettes. She has never used smokeless tobacco. She reports current alcohol use of about 7.0 standard drinks of alcohol per week. She reports that she does not use drugs.   Family History:  The patient's family history includes Emphysema in her mother; Prostate cancer in her father; Skin cancer in her father.   ROS:  Please see the history of present illness.   All other systems are personally reviewed and negative.   Exam:   BP (!) 164/98   Pulse (!) 124   Ht 5\' 2"  (1.575 m)   Wt 50.3 kg (110 lb 12.8 oz)   SpO2 97%   BMI 20.27 kg/m  General: NAD Neck: JVP 8-9 cm, no thyromegaly or thyroid nodule.  Lungs: Clear to auscultation bilaterally with normal respiratory effort. CV: Nondisplaced PMI.  Heart mildly tachy, irregular S1/S2, no S3/S4, no murmur.  1+ edema 1/2 to knees bilaterally.  No carotid bruit.  Normal pedal pulses.  Abdomen: Soft, nontender, no hepatosplenomegaly, no  distention.  Skin: Intact without lesions or rashes.  Neurologic: Alert and oriented x 3.  Psych: Normal affect. Extremities: No clubbing or cyanosis.  HEENT: Normal.   Recent Labs: 03/31/2022: Hemoglobin 12.6; Platelets 204 09/11/2022: Magnesium 2.1 03/22/2023: B Natriuretic Peptide 329.0; BUN 9; Creatinine, Ser 0.65; Potassium 4.1; Sodium 139  Personally reviewed   Wt Readings from Last 3 Encounters:  03/22/23 50.3 kg (110 lb 12.8 oz)  09/11/22 56.1 kg (123 lb 9.6 oz)  03/31/22 56.5 kg (124 lb 9.6 oz)     ASSESSMENT AND PLAN:  1. Takotsubo cardiomyopathy: Resolved, EF normal on last echo in 6/23.  She did have some evidence for diastolic CHF with 6/23 echo showing mildly elevated PA pressure and dilated IVC.  She has likely been in atrial fibrillation with RVR since last week and is volume overloaded on exam in setting of AF/RVR and volume load with IVF last week. NYHA class II symptoms.  - Continue valsartan 80 mg bid.  - She has had dyspnea/wheezing/respiratory decompensation on beta blockers (especially Coreg).  With RVR today, I will start her on low dose Toprol XL 25 mg daily. Can stop this once she is back in NSR.    - Continue Lasix 20 mg daily and KCl 10 mEq daily (started 2 days ago and peripheral edema is improving).  BMET/BNP today.  - Add Farxiga 10 mg daily.  BMET 10 days.  Hopefully we can transition her off Lasix/KCl and just leave her on Farxiga when she is back in sinus rhythm and well-diuresed.   - She needs echo, will arrange.  2. Atrial fibrillation: Paroxysmal.  She has had afib ablation x 2. She stopped Tikosyn in 3/17 but restarted in 9/17 and had DCCV to NSR. She had planned a 3rd atrial fibrillation ablation with Dr. Smith Robert at [facility] but he has moved.  She continues to have periodic symptomatic AF episodes and has been in AF/RVR now for about 5 days.  - Continue Eliquis. CBC today.   - Continue dofetilide.  Check BMET/Mg today.  QTc acceptable on ECG.  Needs to  avoid meds that give risk for QT prolongation.  - Add Toprol XL to 25 mg daily for rate control.  She has historically not tolerated beta blockers well so will try to stop when she  is back in NSR.  - She will return in 1 week.  If she is still in atrial fibrillation, I will arrange for DCCV.  She has not missed any Eliquis doses.  - I will refer her back to EP, used to see Dr. Johney Frame but now retired.  Given symptomatic PAF, would like her considered for redo ablation.    3. HTN: BP is high today.  She is on valsartan, adding Toprol XL. 4. NSCLC: She is on osimertinib and sotorasib for this.   5. Abdominal pain: Has had extensive workup, so far negative.  Poor appetite and weight loss.  May be due to sotorasib.   - Following with GI in [place].   Followup next week.   Signed, Marca Ancona, MD  03/22/2023   [facility] [address] [facility] [place] [address] [phone] (office) [phone] (fax)

## 2023-03-22 NOTE — Patient Instructions (Signed)
START Farxiga 10 mg daily  START Toprol XL 25 mg daily.  CONTINUE Taking Lasix 20 mg daily and potassium 10 mEq daily  Labs done today, your results will be available in MyChart, we will contact you for abnormal readings.   Your physician has requested that you have an echocardiogram. Echocardiography is a painless test that uses sound waves to create images of your heart. It provides your doctor with information about the size and shape of your heart and how well your heart's chambers and valves are working. This procedure takes approximately one hour. There are no restrictions for this procedure. Please do NOT wear cologne, perfume, aftershave, or lotions (deodorant is allowed). Please arrive 15 minutes prior to your appointment time.  WE WILL CALL YOU TOMORROW ABOUT THE ECHO.   You have been referred to Dr. Elberta Fortis. His office will call you to arrange your appointment.  Your physician recommends that you schedule a follow-up appointment in: 1 week  If you have any questions or concerns before your next appointment please send [place] a message through [place] or call our office at [phone].    TO LEAVE A MESSAGE FOR THE NURSE SELECT OPTION 2, PLEASE LEAVE A MESSAGE INCLUDING: YOUR NAME DATE OF BIRTH CALL BACK NUMBER REASON FOR CALL**this is important as we prioritize the call backs  YOU WILL RECEIVE A CALL BACK THE SAME DAY AS LONG AS YOU CALL BEFORE 4:00 PM  At the [facility], you and your health needs are our priority. As part of our continuing mission to provide you with exceptional heart care, we have created designated Provider Care Teams. These Care Teams include your primary Cardiologist (physician) and Advanced Practice Providers (APPs- Physician Assistants and Nurse Practitioners) who all work together to provide you with the care you need, when you need it.   You may see any of the following providers on your designated Care Team at your next follow  up: Dr Arvilla Meres Dr Marca Ancona Dr. Marcos Eke, NP Robbie Lis, [place] [facility] [place], [place] Brynda Peon, NP Karle Plumber, PharmD   Please be sure to bring in all your medications bottles to every appointment.    Thank you for choosing [facility]

## 2023-03-23 ENCOUNTER — Telehealth (HOSPITAL_COMMUNITY): Payer: Self-pay

## 2023-03-23 NOTE — Telephone Encounter (Signed)
Called and left message for patient that her echo is scheduled for Tuesday 03/30/23 at 1 pm

## 2023-03-24 ENCOUNTER — Telehealth (HOSPITAL_COMMUNITY): Payer: Self-pay

## 2023-03-24 NOTE — Telephone Encounter (Signed)
Patient called concerned about medications and her BP lowering. She is currently taking [place] an hour after breakfast, took her Toprol at 10:30 this morning, takes Tikosyn, Valsartan, and Eliquis at noon and in the evening, takes her Lasix and Potassium in the evening.  It looks like she is not spreading them out enough. She said her BP was very low this morning along with HR. Please advise on any changes.

## 2023-03-24 NOTE — Telephone Encounter (Signed)
LMOM for her to call with detailed readings of her BP and HR

## 2023-03-24 NOTE — Telephone Encounter (Signed)
Please see if she can provide readings. Her blood pressure was elevated at f/u 2 days ago

## 2023-03-25 NOTE — Telephone Encounter (Signed)
She can cut back on valsartan to 40 mg bid.  Take other meds as ordered from clinic.  Please go over all meds with her.

## 2023-03-25 NOTE — Telephone Encounter (Signed)
LMOM

## 2023-03-25 NOTE — Telephone Encounter (Signed)
Patient left message on triage VM with details regarding medications and b/p  5/13 metoprolol, farxiga 5/14 AM tikosyn, eliquis, valsartan PM KcL , and lasix  5/15 metoprol and farxiga taken   With above medications pt now reports dizziness and decrease in b/p, and decrease in swelling   133/70 now 128/68   Was told to return call with above details Pt would like to discontinue some of these meds

## 2023-03-26 ENCOUNTER — Telehealth (HOSPITAL_COMMUNITY): Payer: Self-pay

## 2023-03-26 MED ORDER — VALSARTAN 40 MG PO TABS: 40.0000 mg | ORAL_TABLET | Freq: Every day | ORAL | 1 refills | Status: DC

## 2023-03-26 NOTE — Telephone Encounter (Signed)
Spoke to patient and updated her on the Valsartan.  She said she does not want to take Metoprolol because it can cause hair loss.  She will discuss at appointment.

## 2023-03-26 NOTE — Telephone Encounter (Signed)
LMOM

## 2023-03-26 NOTE — Telephone Encounter (Signed)
error 

## 2023-03-29 ENCOUNTER — Telehealth (HOSPITAL_COMMUNITY): Payer: Self-pay

## 2023-03-29 NOTE — Telephone Encounter (Signed)
I spoke with patient and updated her on medications. She verbalized understanding.

## 2023-03-29 NOTE — Telephone Encounter (Signed)
She can stop taking the metoprolol for now if she thinks it is making her dizzy.  Needs to take the [place].  It is not for diabetes.

## 2023-03-29 NOTE — Telephone Encounter (Signed)
Patient is calling about her medications again. She thinks she is taking too much medication and does not want to take it, especially the metoprolol.  She says she has to go to her testing this morning and does not want to take the medications because it makes her dizzy. She stated she is taking her metoprolol, Farxiga, Eliquis and Losartan in the mornings. She does not understand the [place] because she thinks it is for diabetes. I have been over her medications with her several times and each time it is different on how she is taking.  She stated she is not taking the medications this morning because of her test and she does not want to be dizzy. I have advised her previously not to do that.  She has an echo Tuesday and an appointment with you on Wed. But she wants an answer today because she has commitments later in the week and needs to know asap if she cannot make those because of any changes or further procedures. Is there anything you want to adjust today?

## 2023-03-30 ENCOUNTER — Ambulatory Visit (HOSPITAL_COMMUNITY)
Admission: RE | Admit: 2023-03-30 | Discharge: 2023-03-30 | Disposition: A | Discharge status: Home / Self Care | Payer: Medicare Other | Source: Ambulatory Visit | Attending: Cardiology | Admitting: Cardiology

## 2023-03-30 DIAGNOSIS — I4891 Unspecified atrial fibrillation: Secondary | ICD-10-CM | POA: Diagnosis not present

## 2023-03-30 DIAGNOSIS — I5022 Chronic systolic (congestive) heart failure: Secondary | ICD-10-CM | POA: Diagnosis not present

## 2023-03-30 DIAGNOSIS — I509 Heart failure, unspecified: Secondary | ICD-10-CM | POA: Diagnosis not present

## 2023-03-30 DIAGNOSIS — I429 Cardiomyopathy, unspecified: Secondary | ICD-10-CM | POA: Diagnosis not present

## 2023-03-30 DIAGNOSIS — I110 Hypertensive heart disease with heart failure: Secondary | ICD-10-CM | POA: Insufficient documentation

## 2023-03-30 LAB — ECHOCARDIOGRAM COMPLETE
Calc EF: 39.5 %
MV M vel: 4.72 m/s
MV Peak grad: 88.9 mmHg
Radius: 0.4 cm
S' Lateral: 3.8 cm
Single Plane A2C EF: 42 %
Single Plane A4C EF: 38.3 %

## 2023-03-30 NOTE — Progress Notes (Signed)
Echocardiogram 2D Echocardiogram has been performed.  Maria Arnold 03/30/2023, 1:47 PM

## 2023-03-31 ENCOUNTER — Ambulatory Visit (HOSPITAL_COMMUNITY)
Admission: RE | Admit: 2023-03-31 | Discharge: 2023-03-31 | Disposition: A | Discharge status: Home / Self Care | Payer: Medicare Other | Source: Ambulatory Visit | Attending: Cardiology | Admitting: Cardiology

## 2023-03-31 ENCOUNTER — Encounter (HOSPITAL_COMMUNITY): Payer: Self-pay | Admitting: Cardiology

## 2023-03-31 ENCOUNTER — Other Ambulatory Visit (HOSPITAL_COMMUNITY): Payer: Self-pay

## 2023-03-31 VITALS — BP 124/60 | HR 118 | Wt 106.0 lb

## 2023-03-31 DIAGNOSIS — Z7901 Long term (current) use of anticoagulants: Secondary | ICD-10-CM | POA: Diagnosis not present

## 2023-03-31 DIAGNOSIS — J45909 Unspecified asthma, uncomplicated: Secondary | ICD-10-CM | POA: Diagnosis not present

## 2023-03-31 DIAGNOSIS — I5022 Chronic systolic (congestive) heart failure: Secondary | ICD-10-CM | POA: Diagnosis not present

## 2023-03-31 DIAGNOSIS — Z681 Body mass index (BMI) 19 or less, adult: Secondary | ICD-10-CM | POA: Insufficient documentation

## 2023-03-31 DIAGNOSIS — F419 Anxiety disorder, unspecified: Secondary | ICD-10-CM | POA: Insufficient documentation

## 2023-03-31 DIAGNOSIS — I4819 Other persistent atrial fibrillation: Secondary | ICD-10-CM

## 2023-03-31 DIAGNOSIS — Z79899 Other long term (current) drug therapy: Secondary | ICD-10-CM | POA: Diagnosis not present

## 2023-03-31 DIAGNOSIS — R630 Anorexia: Secondary | ICD-10-CM | POA: Diagnosis not present

## 2023-03-31 DIAGNOSIS — I110 Hypertensive heart disease with heart failure: Secondary | ICD-10-CM | POA: Insufficient documentation

## 2023-03-31 DIAGNOSIS — C3490 Malignant neoplasm of unspecified part of unspecified bronchus or lung: Secondary | ICD-10-CM | POA: Diagnosis not present

## 2023-03-31 LAB — BRAIN NATRIURETIC PEPTIDE: B Natriuretic Peptide: 207.9 pg/mL — ABNORMAL HIGH (ref 0.0–100.0)

## 2023-03-31 MED ORDER — METOPROLOL SUCCINATE ER 25 MG PO TB24: 25.0000 mg | ORAL_TABLET | Freq: Every evening | ORAL | 3 refills | Status: DC

## 2023-03-31 NOTE — Progress Notes (Signed)
Date:  03/31/2023   ID:  Maria Arnold, DOB 04-12-1946, MRN [id number]   Provider location: [place] [facility] Type of Visit: Established patient   PCP:  Copland, Gwenlyn Found, MD  Cardiologist: Dr. Shirlee Latch   History of Present Illness: Maria Arnold is a 77 y.o. 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  For about a week prior to initial admission in 2/12, she had had congestion and shortness of breath. She thought she had a bad cold. She was seen by her PCP and ECG showed atrial fibrillation with RVR. She was admitted to [facility] for evaluation. CXR did not show PNA. She had wheezing consistent with a prior asthma diagnosis. She was then noted by echo to have EF 30-35% with peri-apical severe hypokinesis. LHC was done, there was no angiographic coronary disease. 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 She had suffered an emotional shock about a week prior to admission when her daughter-in-law had a miscarriage.    She underwent TEE-guided cardioversion, but this failed to convert her to NSR. She was then started on Tikosyn, which did convert her to NSR but her QTc prolonged in the setting of using moxifloxacin for acute bronchitis treatment. Moxifloxacin was stopped and the dofetilide dose was cut back to 250 micrograms two times a day. At this dose, QTc was normal.  Repeat echo done in 7/12 showed EF improved to 55-60% with normal RV and valves.  She underwent atrial fibrillation ablation in 11/13.  In 4/14, we talked about stopping Tikosyn and she got very nervous.  She actually went into atrial fibrillation in the office so I did not stop Tikosyn.   She had a re-do atrial fibrillation ablation in 4/16.    She saw Dr Johney Frame in 3/17 and stopped Tikosyn and bisoprolol.  After stopping bisoprolol, she felt much better, no further wheezing or dyspnea.  She  went back into atrial fibrillation and was reloaded on Tikosyn and cardioverted in 9/17.   She later saw Dr Hurman Horn at [facility] and was planning on undergoing a 3rd ablation but he left [facility].    She has been somewhat sporadic about followup, which has been challenging given Tikosyn use.   Echo in 4/22 at [facility] showed EF 60-65% with normal RV.   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  More recently, she was found to have metastatic non-small cell lung cancer.  She is currently on osimertinib and sotorasib.  Sotorasib has been causing GI discomfort and poor po intake.   Echo in 6/23 showed EF 55%, normal RV, PASP 38, severe LAE, moderate RAE, mild MR, dilated IVC.   Patient went back into persistent atrial fibrillation in 5/24.  Echo in 5/24 showed EF 35-40%, diffuse hypokinesis, mild RV dysfunction, mild MR.    She returns for followup of CHF.   I started her on Lasix earlier this month, weight down 4 lbs and decreased peripheral edema.  She is not taking Marcelline Deist (thought was for diabetes) or Toprol XL (scared about losing her hair). She is still in atrial fibrillation, rate 100s today.  She tires easily but denies dyspnea walking on flat ground or up stairs.  No chest pain.  No orthopnea/PND.     Labs (2/12): K 4.8, creatinine 0.67  Labs (3/12): K 4.2, creatinine 0.5  Labs (11/13): K  3.8, creatinine 0.5 Labs (4/14): K 4.1, creatinine 0.7, HCT 44.7 Labs (12/14): K 4.1, creatinine 0.6, HCT 42, TSH normal, Mg 2.1 Labs (8/15): K 4.2, creatinine 0.5, HCT 41, thyroid indices normal Labs (4/16): K 3.8, creatinine 0.46, Mg 1.8 Labs (9/17): K 5.1, creatinine 0.5, Mg 2 Labs (2/18): LDL 101, HDL 72, hgb 13.2, K 4.6, creatinine 0.49, Mg 1.8 Labs (8/19): K 4.3, creatinine 0.54 Labs (8/21): K 4.3, creatinine 0.5, hgb 12.5 Labs (4/23): K 5.1, creatinine 0.79 Labs (5/23): LDL 85, HDL 96 Labs (5/24): LFTs normal, lipase not elevated, K 3.9, creatinine  0.68, hgb 10.4  ECG (personally reviewed): Atrial fibrillation rate 120, nonspecific T wave changes, QTc 486 msec   Past Medical History:  1. Paroxysmal atrial fibrillation with rapid ventricular response. She is on coumadin and dofetilide. She has had some breakthrough atrial fib on dofetilide. Atrial fibrillation ablation in 11/13 and redo in 4/16. Dofetilide stopped 3/17, had breakthrough atrial fibrillation after this. Reloaded with Tikosyn in 9/17 and cardioverted to NSR.  2. Cardiomyopathy: Nonischemic. LHC (2/12): No coronary disease, EF 30-35% with periapical severe hypokinesis. Echo (2/12): EF 30-35%, periapical severe hypokinesis, normal RV, mild to moderate MR. 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  Repeat echo in 7/12 showed EF 55-60%, mild biatrial enlargement, normal RV, normal valves.  - TEE (4/16) with EF 55-60%. - Echo (6/17) with EF 55-60%.  - Echo (4/22): EF 55-60%, normal RV.  - Echo (6/23): EF 55%, normal RV, PASP 38, severe LAE, moderate RAE, mild MR, dilated IVC.  - Echo (5/24): EF 35-40%, diffuse hypokinesis, mild RV dysfunction, mild MR.  3. Asthma: Beta blocker intolerance.  4. HTN  5. Foot pain from bone spur  6. Low back pain  7. Narrow angle glaucoma 8. 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  10. Non-small cell lung cancer: Metastatic.  - Treated currently with osimertinib and sotorasib.   Current Outpatient Medications  Medication Sig Dispense Refill   albuterol (PROVENTIL) (2.5 MG/3ML) 0.083% nebulizer solution SMARTSIG:3 Milliliter(s) Via Nebulizer Every 6 Hours PRN     ELIQUIS 5 MG TABS tablet TAKE ONE TABLET BY MOUTH TWICE A DAY 180 tablet 3   osimertinib mesylate (TAGRISSO) 80 MG tablet Take 80 mg by mouth daily.     sotorasib (LUMAKRAS) 120 MG tablet Take 240 mg by mouth daily.     tamsulosin (FLOMAX) 0.4 MG CAPS  capsule Take 0.4 mg by mouth every other day.     TIKOSYN 500 MCG capsule TAKE ONE CAPSULE BY MOUTH TWICE A DAY 180 capsule 1   valsartan (DIOVAN) 40 MG tablet Take 1 tablet (40 mg total) by mouth daily. 60 tablet 1   dapagliflozin propanediol (FARXIGA) 10 MG TABS tablet Take 1 tablet (10 mg total) by mouth daily before breakfast. (Patient not taking: Reported on 03/31/2023) 30 tablet 11   metoprolol succinate (TOPROL XL) 25 MG 24 hr tablet Take 1 tablet (25 mg total) by mouth at bedtime. 90 tablet 3   No current facility-administered medications for this encounter.    Allergies:   Cephalexin and Lisinopril   Social History:  The patient  reports that she has quit smoking. Her smoking use included cigarettes. She has never used smokeless tobacco. She reports current alcohol use of about 7.0 standard drinks of alcohol per week. She reports that she does not use drugs.   Family History:  The  patient's family history includes Emphysema in her mother; Prostate cancer in her father; Skin cancer in her father.   ROS:  Please see the history of present illness.   All other systems are personally reviewed and negative.   Exam:   BP 124/60   Pulse (!) 118   Wt 48.1 kg (106 lb)   SpO2 98%   BMI 19.39 kg/m  General: NAD Neck: No JVD, no thyromegaly or thyroid nodule.  Lungs: Clear to auscultation bilaterally with normal respiratory effort. CV: Nondisplaced PMI.  Heart irregular S1/S2, no S3/S4, no murmur.  No peripheral edema.  No carotid bruit.  Normal pedal pulses.  Abdomen: Soft, nontender, no hepatosplenomegaly, no distention.  Skin: Intact without lesions or rashes.  Neurologic: Alert and oriented x 3.  Psych: Normal affect. Extremities: No clubbing or cyanosis.  HEENT: Normal.   Recent Labs: 09/11/2022: Magnesium 2.1 03/22/2023: BUN 9; Creatinine, Ser 0.65; Potassium 4.1; Sodium 139 03/31/2023: B Natriuretic Peptide 207.9  Personally reviewed   Wt Readings from Last 3 Encounters:   03/31/23 48.1 kg (106 lb)  03/22/23 50.3 kg (110 lb 12.8 oz)  09/11/22 56.1 kg (123 lb 9.6 oz)     ASSESSMENT AND PLAN:  1. 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  Cath at that time showed no coronary disease.  EF improved to normal range with time and resolution of atrial fibrillation. She has been persistently in atrial fibrillation with RVR again for several weeks.  Echo in 5/24 showed EF back down to 35-40% with diffuse hypokinesis and mild RV dysfunction.  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  No chest pain.  NYHA class II symptoms now with weight and peripheral edema down now that she has started Lasix.  She looks euvolemic today.  - Continue valsartan 40 mg daily.  - Start Toprol XL 25 mg daily, take at night to limit BP drop during the day.  - Stop Lasix and start Farxiga 10 mg daily. BMET/BNP today and BMET in 10 days.  - We need to get her back in NSR (see below) and then repeat echo in a few months.  2. Atrial fibrillation: Paroxysmal.  She has had afib ablation x 2. She stopped Tikosyn in 3/17 but restarted in 9/17 and had DCCV to NSR. She had planned a 3rd atrial fibrillation ablation with Dr. Smith Robert at [facility] this was never done.  She continues to have periodic symptomatic AF episodes and has been in AF/RVR now for at least 3 wks.  Echo showed fall in EF that may be tachycardia-mediated. She remains in atrial fibrillation with mild RVR today.  - Continue Eliquis. She has not missed any recent doses.  - Continue dofetilide.  Check BMET/Mg today.  QTc acceptable on ECG.  Needs to avoid meds that give risk for QT prolongation.  - Start Toprol XL 25 mg daily.  - I will arrange for DCCV next week (no missed Eliquis doses).  We discussed risks/benefits and she agrees to procedure.   - I will refer her back to EP, used to see Dr. Johney Frame but now retired.  Given symptomatic PAF, would like her considered for  redo ablation.    3. HTN: BP controlled.  4. NSCLC: She is on osimertinib and sotorasib for this.   5. Abdominal pain: Has had extensive workup, so far negative.  Poor appetite and weight loss.  May be due to sotorasib.   - Following with GI in [place].  -  Would add Ensure bid.  6. Anxiety: Significant anxiety.  Would consider SSRI. I asked her to discuss this with her PCP.   I will arrange for followup in 1 month.   Signed, Marca Ancona, MD  03/31/2023   [facility] [place] [address] [facility] [place] [address] [phone] (office) [phone] (fax)

## 2023-03-31 NOTE — Patient Instructions (Signed)
RESTART Farxiga 10 mg daily ( in the morning)  RESTART Toprol XL 25 mg at bedtime.  STOP Lasix  STOP Potassium  Labs done today, your results will be available in MyChart, we will contact you for abnormal readings.  You are scheduled for a Cardioversion on Tuesday, May 28 with Dr. Shirlee Latch.  Please arrive at the [facility] (Main Entrance A) at [facility]: [address] [place], [place] [address] at 7:30 AM (This time is 1 hour(s) before your procedure to ensure your preparation). Free valet parking service is available. You will check in at ADMITTING. The support person will be asked to wait in the waiting room.  It is OK to have someone drop you off and come back when you are ready to be discharged.      DIET:  Nothing to eat or drink after midnight except a sip of water with medications (see medication instructions below)  MEDICATION INSTRUCTIONS:   HOLD Farxiga the morning of your procedure.         Continue taking your anticoagulant (blood thinner): Apixaban (Eliquis).  You will need to continue this after your procedure until you are told by your provider that it is safe to stop.    FYI:  For your safety, and to allow [place] to monitor your vital signs accurately during the surgery/procedure we request: If you have artificial nails, gel coating, SNS etc, please have those removed prior to your surgery/procedure. Not having the nail coverings /polish removed may result in cancellation or delay of your surgery/procedure.  You must have a responsible person to drive you home and stay in the waiting area during your procedure. Failure to do so could result in cancellation.  Bring your insurance cards.  *Special Note: Every effort is made to have your procedure done on time. Occasionally there are emergencies that occur at the hospital that may cause delays. Please be patient if a delay does occur.   Your physician recommends that you schedule a follow-up appointment as  scheduled.  If you have any questions or concerns before your next appointment please send [place] a message through [place] or call our office at [phone].    TO LEAVE A MESSAGE FOR THE NURSE SELECT OPTION 2, PLEASE LEAVE A MESSAGE INCLUDING: YOUR NAME DATE OF BIRTH CALL BACK NUMBER REASON FOR CALL**this is important as we prioritize the call backs  YOU WILL RECEIVE A CALL BACK THE SAME DAY AS LONG AS YOU CALL BEFORE 4:00 PM  At the [facility], you and your health needs are our priority. As part of our continuing mission to provide you with exceptional heart care, we have created designated Provider Care Teams. These Care Teams include your primary Cardiologist (physician) and Advanced Practice Providers (APPs- Physician Assistants and Nurse Practitioners) who all work together to provide you with the care you need, when you need it.   You may see any of the following providers on your designated Care Team at your next follow up: Dr Arvilla Meres Dr Marca Ancona Dr. Marcos Eke, NP Robbie Lis, [place] [facility] [place], [place] Brynda Peon, NP Karle Plumber, PharmD   Please be sure to bring in all your medications bottles to every appointment.    Thank you for choosing [facility]

## 2023-04-02 ENCOUNTER — Telehealth (HOSPITAL_COMMUNITY): Payer: Self-pay

## 2023-04-02 ENCOUNTER — Telehealth (HOSPITAL_COMMUNITY): Payer: Self-pay | Admitting: Cardiology

## 2023-04-02 NOTE — Telephone Encounter (Signed)
Patient called and states she cannot do cardioversion on Tuesday and will need to have rescheduled.

## 2023-04-02 NOTE — Telephone Encounter (Signed)
She continues to call and leave messages. Can someone set up a new cardioversion?

## 2023-04-02 NOTE — Telephone Encounter (Signed)
Please call regarding r/s cat on 05/28, pt left 2 vm. Thanks

## 2023-04-02 NOTE — Telephone Encounter (Signed)
Patient had called triage needing to reschedule cardioversion. Called patient rescheduled for June 7 arriving @8 :30 am at entrance A.Pt agreeable to date and time.

## 2023-04-05 NOTE — Pre-Procedure Instructions (Signed)
Patient takes Contractor. This drug has to be held for 72 hours before anesthesia procedure.  Left voice mail for the patient.  Hendricks Limes- last dose Monday June

## 2023-04-15 ENCOUNTER — Telehealth (HOSPITAL_COMMUNITY): Payer: Self-pay | Admitting: *Deleted

## 2023-04-15 ENCOUNTER — Telehealth (HOSPITAL_COMMUNITY): Payer: Self-pay

## 2023-04-15 NOTE — Progress Notes (Signed)
Spoke to pt and instructed her to arrive at 0830 and to remain NPO after 0000. Instructed to take ac in am with a small sip of water and confirmed no missed doses. Confirmed that pt will have a ride home and a responsible person stay with them for 24 hours.

## 2023-04-15 NOTE — Telephone Encounter (Signed)
Spoke to patient about her concerns about cardioversion for tomorrow.  Dr. Shirlee Latch aware and patient to come tomorrow as scheduled for cardioversion Instructions reviewed with patient. Aware of nothing to eat or drink after midnight. Holding [place]. Continue Eliquis. Has transportation to and from procedure.

## 2023-04-15 NOTE — Telephone Encounter (Signed)
Pt called stating she thinks she has pneumonia and has a fever of 100. She asked if she should keep her DCCV appt tomorrow.  Routed to Dr.McLean for advice

## 2023-04-15 NOTE — Telephone Encounter (Signed)
As long as she is not febrile tomorrow, would come in and get it done.

## 2023-04-16 ENCOUNTER — Encounter (HOSPITAL_COMMUNITY): Payer: Self-pay | Admitting: Anesthesiology

## 2023-04-16 ENCOUNTER — Ambulatory Visit (HOSPITAL_COMMUNITY): Admission: RE | Admit: 2023-04-16 | Payer: Medicare Other | Source: Home / Self Care | Admitting: Cardiology

## 2023-04-16 ENCOUNTER — Encounter (HOSPITAL_COMMUNITY): Admission: RE | Payer: Self-pay | Source: Home / Self Care

## 2023-04-16 ENCOUNTER — Telehealth (HOSPITAL_COMMUNITY): Payer: Self-pay

## 2023-04-16 DIAGNOSIS — I4819 Other persistent atrial fibrillation: Secondary | ICD-10-CM

## 2023-04-16 SURGERY — CARDIOVERSION
Anesthesia: Monitor Anesthesia Care

## 2023-04-16 NOTE — Anesthesia Preprocedure Evaluation (Signed)
Anesthesia Evaluation  Patient identified by MRN, date of birth, ID band Patient awake    Reviewed: Allergy & Precautions, H&P , NPO status , Patient's Chart, lab work & pertinent test results  Airway        Dental no notable dental hx.    Pulmonary asthma , former smoker   Pulmonary exam normal        Cardiovascular hypertension, Pt. on medications and Pt. on home beta blockers +CHF  + dysrhythmias Atrial Fibrillation      Neuro/Psych negative neurological ROS  negative psych ROS   GI/Hepatic negative GI ROS, Neg liver ROS,,,  Endo/Other  negative endocrine ROS    Renal/GU negative Renal ROS  negative genitourinary   Musculoskeletal   Abdominal   Peds  Hematology negative hematology ROS (+)   Anesthesia Other Findings   Reproductive/Obstetrics negative OB ROS                             Anesthesia Physical Anesthesia Plan  ASA: 4  Anesthesia Plan: General   Post-op Pain Management: Minimal or no pain anticipated   Induction: Intravenous  PONV Risk Score and Plan: 3 and Propofol infusion and Treatment may vary due to age or medical condition  Airway Management Planned: Mask and Natural Airway  Additional Equipment:   Intra-op Plan:   Post-operative Plan:   Informed Consent: I have reviewed the patients History and Physical, chart, labs and discussed the procedure including the risks, benefits and alternatives for the proposed anesthesia with the patient or authorized representative who has indicated his/her understanding and acceptance.     Dental advisory given  Plan Discussed with: CRNA  Anesthesia Plan Comments:        Anesthesia Quick Evaluation

## 2023-04-16 NOTE — Telephone Encounter (Signed)
As per Dr.McLean called patient to make an appointment for her to see app to obtain EKG. Appointment is for Friday  14 @1200  Cardioversion canceled due to patient being sick

## 2023-04-21 NOTE — Progress Notes (Signed)
Date:  04/23/2023   ID:  Maria Arnold, DOB 11-Sep-1946, MRN [id number]   Provider location: [facility] Type of Visit: Established patient   PCP:  Copland, Gwenlyn Found, MD  Cardiologist: Dr. Shirlee Latch   History of Present Illness: Maria Arnold is a 77 y.o. 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  For about a week prior to initial admission in 2/12, she had had congestion and shortness of breath. She thought she had a bad cold. She was seen by her PCP and ECG showed atrial fibrillation with RVR. She was admitted to [facility] for evaluation. CXR did not show PNA. She had wheezing consistent with a prior asthma diagnosis. She was then noted by echo to have EF 30-35% with peri-apical severe hypokinesis. LHC was done, there was no angiographic coronary disease. 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 She had suffered an emotional shock about a week prior to admission when her daughter-in-law had a miscarriage.    She underwent TEE-guided cardioversion, but this failed to convert her to NSR. She was then started on Tikosyn, which did convert her to NSR but her QTc prolonged in the setting of using moxifloxacin for acute bronchitis treatment. Moxifloxacin was stopped and the dofetilide dose was cut back to 250 micrograms two times a day. At this dose, QTc was normal.  Repeat echo done in 7/12 showed EF improved to 55-60% with normal RV and valves. She underwent atrial fibrillation ablation in 11/13.  In 4/14, we talked about stopping Tikosyn and she got very nervous.  She actually went into atrial fibrillation in the office so I did not stop Tikosyn.   She had a re-do atrial fibrillation ablation in 4/16.    She saw Dr Johney Frame in 3/17 and stopped Tikosyn and bisoprolol.  After stopping bisoprolol, she felt much better, no further wheezing or dyspnea.  She went  back into atrial fibrillation and was reloaded on Tikosyn and cardioverted in 9/17.   She later saw Dr Hurman Horn at [facility] and was planning on undergoing a 3rd ablation but he left [facility].    She has been somewhat sporadic about followup, which has been challenging given Tikosyn use.   Echo in 4/22 at [facility] showed EF 60-65% with normal RV.   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  More recently, she was found to have metastatic non-small cell lung cancer.  She is currently on osimertinib and sotorasib.  Sotorasib has been causing GI discomfort and poor po intake.   Echo in 6/23 showed EF 55%, normal RV, PASP 38, severe LAE, moderate RAE, mild MR, dilated IVC.   Patient went back into persistent atrial fibrillation in 5/24.  Echo in 5/24 showed EF 35-40%, diffuse hypokinesis, mild RV dysfunction, mild MR.    Follow up 5/24, NYHA II symptoms, weight down but now with peripheral edema. Lasix changed to PRN and started on Farxiga. She remained in AF with mild RVR, started on Toprol XL 25 mg with plans for DCCV.  She cancelled her DCCV due to fevers. She saw her Urologist 04/01/23 for bladder pain and fevers. 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   Today she returns for HF follow up. Overall feeling fine. She has SOB walking up steps but otherwise no issues. She  feels she converted to NSR. She has some pedal swelling yesterday and took Lasix. She has been strugging with diarrhea, she suspects from her CA treatments, and has been taking immodium. She feels very weak an hour after taking her [place]. Her appetite is poor. Denies palpitations, abnormal bleeding, CP, dizziness, or PND/Orthopnea. Appetite ok. No fever or chills. Weight at home 108 pounds. Taking all medications. She snores, has been told she has mild OSA but does not wear CPAP. Had fever 99 today, no other symptoms. Urologist  recently started her on Flomax for ureteral stenosis. Of note, her husband recently passed.  ECG (personally reviewed): NSR 78 bpm, QTc 437 msec  Labs (2/12): K 4.8, creatinine 0.67  Labs (3/12): K 4.2, creatinine 0.5  Labs (11/13): K 3.8, creatinine 0.5 Labs (4/14): K 4.1, creatinine 0.7, HCT 44.7 Labs (12/14): K 4.1, creatinine 0.6, HCT 42, TSH normal, Mg 2.1 Labs (8/15): K 4.2, creatinine 0.5, HCT 41, thyroid indices normal Labs (4/16): K 3.8, creatinine 0.46, Mg 1.8 Labs (9/17): K 5.1, creatinine 0.5, Mg 2 Labs (2/18): LDL 101, HDL 72, hgb 13.2, K 4.6, creatinine 0.49, Mg 1.8 Labs (8/19): K 4.3, creatinine 0.54 Labs (8/21): K 4.3, creatinine 0.5, hgb 12.5 Labs (4/23): K 5.1, creatinine 0.79 Labs (5/23): LDL 85, HDL 96 Labs (5/24): LFTs normal, lipase not elevated, K 4.1, creatinine 0.65, hgb 10.4    Past Medical History:  1. Paroxysmal atrial fibrillation with rapid ventricular response. She is on coumadin and dofetilide. She has had some breakthrough atrial fib on dofetilide. Atrial fibrillation ablation in 11/13 and redo in 4/16. Dofetilide stopped 3/17, had breakthrough atrial fibrillation after this. Reloaded with Tikosyn in 9/17 and cardioverted to NSR.  2. Cardiomyopathy: Nonischemic. LHC (2/12): No coronary disease, EF 30-35% with periapical severe hypokinesis. Echo (2/12): EF 30-35%, periapical severe hypokinesis, normal RV, mild to moderate MR. 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  Repeat echo in 7/12 showed EF 55-60%, mild biatrial enlargement, normal RV, normal valves.  - TEE (4/16) with EF 55-60%. - Echo (6/17) with EF 55-60%.  - Echo (4/22): EF 55-60%, normal RV.  - Echo (6/23): EF 55%, normal RV, PASP 38, severe LAE, moderate RAE, mild MR, dilated IVC.  - Echo (5/24): EF 35-40%, diffuse hypokinesis, mild RV dysfunction, mild MR.  3. Asthma: Beta blocker intolerance.  4. HTN  5. Foot  pain from bone spur  6. Low back pain  7. Narrow angle glaucoma 8. 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  10. Non-small cell lung cancer: Metastatic.  - Treated currently with osimertinib and sotorasib.   Current Outpatient Medications  Medication Sig Dispense Refill   albuterol (PROVENTIL) (2.5 MG/3ML) 0.083% nebulizer solution Take 2.5 mg by nebulization every 6 (six) hours as needed for wheezing or shortness of breath.     albuterol (VENTOLIN HFA) 108 (90 Base) MCG/ACT inhaler Inhale 1-2 puffs into the lungs every 6 (six) hours as needed for wheezing or shortness of breath.     amoxicillin-clavulanate (AUGMENTIN) 875-125 MG tablet Take 1 tablet by mouth 2 (two) times daily.     dapagliflozin propanediol (FARXIGA) 10 MG TABS tablet Take 1 tablet (10 mg total) by mouth daily before breakfast. (Patient taking differently: Take 10 mg by mouth daily after lunch.) 30 tablet 11   ELIQUIS 5 MG TABS tablet TAKE ONE TABLET BY MOUTH TWICE A DAY 180 tablet 3  loperamide (IMODIUM) 2 MG capsule Take 2-4 mg by mouth 4 (four) times daily as needed for diarrhea or loose stools.     metoprolol succinate (TOPROL XL) 25 MG 24 hr tablet Take 1 tablet (25 mg total) by mouth at bedtime. 90 tablet 3   osimertinib mesylate (TAGRISSO) 80 MG tablet Take 80 mg by mouth daily at 10 pm.     sotorasib (LUMAKRAS) 120 MG tablet Take 480 mg by mouth daily at 8 pm.     tamsulosin (FLOMAX) 0.4 MG CAPS capsule Take 0.4 mg by mouth every evening.     TIKOSYN 500 MCG capsule TAKE ONE CAPSULE BY MOUTH TWICE A DAY 180 capsule 1   valsartan (DIOVAN) 40 MG tablet Take 1 tablet (40 mg total) by mouth daily. 60 tablet 1   pantoprazole (PROTONIX) 40 MG tablet Take by mouth. (Patient not taking: Reported on 04/23/2023)     No current facility-administered medications for this encounter.    Allergies:   Cephalexin and Lisinopril   Social History:  The patient  reports that she has  quit smoking. Her smoking use included cigarettes. She has never used smokeless tobacco. She reports current alcohol use of about 7.0 standard drinks of alcohol per week. She reports that she does not use drugs.   Family History:  The patient's family history includes Emphysema in her mother; Prostate cancer in her father; Skin cancer in her father.   ROS:  Please see the history of present illness.   All other systems are personally reviewed and negative.   Exam:   BP 130/86   Pulse 71   Wt 49.4 kg (109 lb)   SpO2 97%   BMI 19.94 kg/m  General:  NAD. No resp difficulty, walked into clinic, anxious HEENT: Normal Neck: Supple. No JVD. Carotids 2+ bilat; no bruits. No lymphadenopathy or thryomegaly appreciated. Cor: PMI nondisplaced. Regular rate & rhythm. No rubs, gallops or murmurs. Lungs: Clear Abdomen: Soft, nontender, nondistended. No hepatosplenomegaly. No bruits or masses. Good bowel sounds. Extremities: No cyanosis, clubbing, rash, trace pedal edema Neuro: Alert & oriented x 3, cranial nerves grossly intact. Moves all 4 extremities w/o difficulty. Affect pleasant.  Recent Labs: 09/11/2022: Magnesium 2.1 03/22/2023: BUN 9; Creatinine, Ser 0.65; Potassium 4.1; Sodium 139 03/31/2023: B Natriuretic Peptide 207.9  Personally reviewed   Wt Readings from Last 3 Encounters:  04/23/23 49.4 kg (109 lb)  03/31/23 48.1 kg (106 lb)  03/22/23 50.3 kg (110 lb 12.8 oz)     ASSESSMENT AND PLAN:  1. 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  Cath at that time showed no coronary disease.  EF improved to normal range with time and resolution of atrial fibrillation. She had been persistently in atrial fibrillation with RVR again for several weeks.  Echo in 5/24 showed EF back down to 35-40% with diffuse hypokinesis and mild RV dysfunction.  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  No chest pain.  NYHA class II symptoms,  she is not volume overloaded today. She has had recent diarrhea with poor po intake, concern for volume depletion. GDMT limited by orthostasis - Place compression hose. - Decrease Farxiga to 5 mg daily. Recent labs reviewed. - Continue valsartan 40 mg daily.  - Continue Toprol XL 25 mg qhs to limit BP drop during the day.  - Continue Lasix PRN. - We need to get her back in NSR (see below) and then repeat echo in a few months.  2. Atrial fibrillation:  Paroxysmal.  She has had afib ablation x 2. She stopped Tikosyn in 3/17 but restarted in 9/17 and had DCCV to NSR. She had planned a 3rd atrial fibrillation ablation with Dr. Smith Robert at [facility] this was never done.  She continues to have periodic symptomatic AF episodes and had been in AF/RVR now for several weeks when she had most recent echo (5/24), which showed fall in EF that may be tachycardia-mediated. Arranged for DCCV, however she cancelled due to fevers. She is back in NSR on ECG today.  - Continue Eliquis. No bleeding issues. - Continue dofetilide.  QTc 437 msec on ECG today. - Continue Toprol XL 25 mg qhs - She has been referred back to EP, used to see Dr. Johney Frame but now retired.  Given symptomatic PAF, would like her considered for redo ablation.    3. HTN: BP controlled.  4. NSCLC: She is on osimertinib and sotorasib for this.   5. Abdominal pain: Has had extensive workup, so far negative.  Poor appetite and weight loss.  May be due to sotorasib.   - Following with GI and Urology in [facility].  6. Anxiety: Significant anxiety.  Would consider SSRI. I asked her to discuss this with her PCP.   Follow up in 2 weeks with Dr. Shirlee Latch, as scheduled.  Signed, Jacklynn Ganong, FNP  04/23/2023   [facility] [place] [address] Heart and Vascular [place] [address] [phone] (office) [phone] (fax)

## 2023-04-22 ENCOUNTER — Telehealth (HOSPITAL_COMMUNITY): Payer: Self-pay

## 2023-04-22 NOTE — Telephone Encounter (Signed)
Called and was unable to leave a voice message  to confirm/remind patient of their appointment at the [facility] on 04/23/23.

## 2023-04-23 ENCOUNTER — Ambulatory Visit (HOSPITAL_COMMUNITY)
Admission: RE | Admit: 2023-04-23 | Discharge: 2023-04-23 | Disposition: A | Discharge status: Home / Self Care | Payer: Medicare Other | Source: Ambulatory Visit | Attending: Family Medicine | Admitting: Family Medicine

## 2023-04-23 ENCOUNTER — Encounter (HOSPITAL_COMMUNITY): Payer: Self-pay

## 2023-04-23 VITALS — BP 130/86 | HR 71 | Wt 109.0 lb

## 2023-04-23 DIAGNOSIS — Z7901 Long term (current) use of anticoagulants: Secondary | ICD-10-CM | POA: Insufficient documentation

## 2023-04-23 DIAGNOSIS — R0602 Shortness of breath: Secondary | ICD-10-CM | POA: Insufficient documentation

## 2023-04-23 DIAGNOSIS — I110 Hypertensive heart disease with heart failure: Secondary | ICD-10-CM | POA: Diagnosis not present

## 2023-04-23 DIAGNOSIS — Z87891 Personal history of nicotine dependence: Secondary | ICD-10-CM | POA: Diagnosis not present

## 2023-04-23 DIAGNOSIS — I4819 Other persistent atrial fibrillation: Secondary | ICD-10-CM | POA: Insufficient documentation

## 2023-04-23 DIAGNOSIS — I10 Essential (primary) hypertension: Secondary | ICD-10-CM

## 2023-04-23 DIAGNOSIS — I480 Paroxysmal atrial fibrillation: Secondary | ICD-10-CM

## 2023-04-23 DIAGNOSIS — I5022 Chronic systolic (congestive) heart failure: Secondary | ICD-10-CM | POA: Diagnosis present

## 2023-04-23 DIAGNOSIS — R197 Diarrhea, unspecified: Secondary | ICD-10-CM | POA: Diagnosis not present

## 2023-04-23 DIAGNOSIS — R531 Weakness: Secondary | ICD-10-CM | POA: Insufficient documentation

## 2023-04-23 DIAGNOSIS — G4733 Obstructive sleep apnea (adult) (pediatric): Secondary | ICD-10-CM | POA: Insufficient documentation

## 2023-04-23 DIAGNOSIS — Z7984 Long term (current) use of oral hypoglycemic drugs: Secondary | ICD-10-CM | POA: Diagnosis not present

## 2023-04-23 DIAGNOSIS — M7989 Other specified soft tissue disorders: Secondary | ICD-10-CM | POA: Diagnosis not present

## 2023-04-23 DIAGNOSIS — C3490 Malignant neoplasm of unspecified part of unspecified bronchus or lung: Secondary | ICD-10-CM | POA: Diagnosis not present

## 2023-04-23 DIAGNOSIS — Z79899 Other long term (current) drug therapy: Secondary | ICD-10-CM | POA: Diagnosis not present

## 2023-04-23 DIAGNOSIS — I428 Other cardiomyopathies: Secondary | ICD-10-CM | POA: Diagnosis not present

## 2023-04-23 DIAGNOSIS — F419 Anxiety disorder, unspecified: Secondary | ICD-10-CM | POA: Insufficient documentation

## 2023-04-23 DIAGNOSIS — R109 Unspecified abdominal pain: Secondary | ICD-10-CM | POA: Diagnosis not present

## 2023-04-23 MED ORDER — DAPAGLIFLOZIN PROPANEDIOL 5 MG PO TABS: 5.0000 mg | ORAL_TABLET | Freq: Every day | ORAL | 3 refills | Status: DC

## 2023-04-23 MED ORDER — VALSARTAN 40 MG PO TABS: 40.0000 mg | ORAL_TABLET | Freq: Every day | ORAL | 3 refills | Status: DC

## 2023-04-23 NOTE — Patient Instructions (Signed)
DECREASE Farxiga to 5 mg daily.  PLEASE WEAR COMPRESSION HOSE.  Please keep your follow up with Dr. Shirlee Latch.  If you have any questions or concerns before your next appointment please send [place] a message through [place] or call our office at [phone].    TO LEAVE A MESSAGE FOR THE NURSE SELECT OPTION 2, PLEASE LEAVE A MESSAGE INCLUDING: YOUR NAME DATE OF BIRTH CALL BACK NUMBER REASON FOR CALL**this is important as we prioritize the call backs  YOU WILL RECEIVE A CALL BACK THE SAME DAY AS LONG AS YOU CALL BEFORE 4:00 PM  At the [facility], you and your health needs are our priority. As part of our continuing mission to provide you with exceptional heart care, we have created designated Provider Care Teams. These Care Teams include your primary Cardiologist (physician) and Advanced Practice Providers (APPs- Physician Assistants and Nurse Practitioners) who all work together to provide you with the care you need, when you need it.   You may see any of the following providers on your designated Care Team at your next follow up: Dr Arvilla Meres Dr Marca Ancona Dr. Marcos Eke, NP Robbie Lis, [place] [facility], [place] Brynda Peon, NP Karle Plumber, PharmD   Please be sure to bring in all your medications bottles to every appointment.    Thank you for choosing [facility]

## 2023-04-30 ENCOUNTER — Encounter: Payer: Self-pay | Admitting: Cardiology

## 2023-04-30 ENCOUNTER — Ambulatory Visit: Payer: Medicare Other | Attending: Cardiology | Admitting: Cardiology

## 2023-04-30 ENCOUNTER — Ambulatory Visit: Payer: Medicare Other | Admitting: Cardiology

## 2023-04-30 VITALS — BP 142/86 | HR 70 | Ht 62.0 in | Wt 108.0 lb

## 2023-04-30 DIAGNOSIS — I480 Paroxysmal atrial fibrillation: Secondary | ICD-10-CM | POA: Diagnosis not present

## 2023-04-30 DIAGNOSIS — D6869 Other thrombophilia: Secondary | ICD-10-CM | POA: Diagnosis not present

## 2023-04-30 DIAGNOSIS — Z01812 Encounter for preprocedural laboratory examination: Secondary | ICD-10-CM | POA: Insufficient documentation

## 2023-04-30 DIAGNOSIS — I5022 Chronic systolic (congestive) heart failure: Secondary | ICD-10-CM | POA: Diagnosis not present

## 2023-04-30 MED ORDER — METOPROLOL SUCCINATE ER 50 MG PO TB24: 50.0000 mg | ORAL_TABLET | Freq: Every day | ORAL | 2 refills | Status: DC

## 2023-04-30 MED ORDER — METOPROLOL SUCCINATE ER 25 MG PO TB24: 25.0000 mg | ORAL_TABLET | Freq: Every day | ORAL | 2 refills | Status: DC

## 2023-04-30 NOTE — Progress Notes (Signed)
Electrophysiology Office Note:   Date:  04/30/2023  ID:  Maria Arnold, DOB 12/24/1945, MRN [id number]  Primary Cardiologist: None Electrophysiologist: None      History of Present Illness:   Maria Arnold is a 77 y.o. female with h/o chronic systolic heart failure, atrial fibrillation, hypertension, non-small cell lung cancer seen today for  for Electrophysiology evaluation of atrial fibrillation at the request of [facility].    She is under quite a bit of stress.  She has been having issues with atrial fibrillation as well.  She feels that she went into atrial fibrillation a week ago.  She has symptoms of palpitations and fatigue associated with her atrial fibrillation.  She is on dofetilide, and would prefer to avoid further medication management.    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  She presented to the hospital in 2012 with shortness of breath and was found to have an ejection fraction of 30 to 35% and angiographically normal coronaries.  She was having emotional shock about her daughter-in-law having a miscarriage.  She does have atrial fibrillation.  She is currently on Tikosyn.  She has had recovery of her ejection fraction.  She went back into atrial fibrillation May 2024.  Echo showed an ejection fraction of 30 to 35%.     Review of systems complete and found to be negative unless listed in HPI.   Studies Reviewed:    EKG is ordered today. Personal review as below.  EKG Interpretation  Date/Time:  Friday April 30 2023 12:41:31 EDT Ventricular Rate:  121 PR Interval:    QRS Duration: 90 QT Interval:  338 QTC Calculation: 479 R Axis:   93 Text Interpretation: Atrial fibrillation with rapid ventricular response Rightward axis When compared with ECG of 23-Apr-2023 12:10, Atrial fibrillation has replaced Sinus rhythm Confirmed by Shammara Jarrett ([id number]) on 04/30/2023 12:44:02 PM   TTE 03/30/23  1. Left ventricular ejection  fraction, by estimation, is 35 to 40%. The  left ventricle has moderately decreased function. The left ventricle  demonstrates global hypokinesis. Left ventricular diastolic function could  not be evaluated.   2. Right ventricular systolic function is mildly reduced. The right  ventricular size is normal. There is normal pulmonary artery systolic  pressure. The estimated right ventricular systolic pressure is 33.0 mmHg.   3. Left atrial size was mildly dilated.   4. Right atrial size was mildly dilated.   5. The mitral valve is grossly normal. Mild mitral valve regurgitation.  No evidence of mitral stenosis.   6. The aortic valve is tricuspid. There is mild calcification of the  aortic valve. Aortic valve regurgitation is not visualized. No aortic  stenosis is present.   7. The inferior vena cava is normal in size with greater than 50%  respiratory variability, suggesting right atrial pressure of 3 mmHg.   Risk Assessment/Calculations:    CHA2DS2-VASc Score = 5   This indicates a 7.2% annual risk of stroke. The patient's score is based upon: CHF History: 1 HTN History: 1 Diabetes History: 0 Stroke History: 0 Vascular Disease History: 0 Age Score: 2 Gender Score: 1    HYPERTENSION CONTROL Vitals:   04/30/23 1223 04/30/23 1323  BP: (!) 160/94 (!) 142/86    The patient's blood pressure is elevated above target today.  In order to address the patient's elevated BP: Blood pressure Maria Arnold be monitored at home to determine if medication changes need to be made.  Physical Exam:   VS:  BP (!) 142/86   Pulse 70   Ht 5\' 2"  (1.575 m)   Wt 108 lb (49 kg)   SpO2 98%   BMI 19.75 kg/m    Wt Readings from Last 3 Encounters:  04/30/23 108 lb (49 kg)  04/23/23 109 lb (49.4 kg)  03/31/23 106 lb (48.1 kg)     GEN: Well nourished, well developed in no acute distress NECK: No JVD; No carotid bruits CARDIAC: Irregularly irregular rate and rhythm, no murmurs, rubs,  gallops RESPIRATORY:  Clear to auscultation without rales, wheezing or rhonchi  ABDOMEN: Soft, non-tender, non-distended EXTREMITIES:  No edema; No deformity   ASSESSMENT AND PLAN:    1.  Paroxysmal atrial fibrillation: Status post ablation x 2.  Currently on dofetilide, Eliquis, Toprol-XL.  She is back in atrial fibrillation with rapid rates.  She feels that she did convert to sinus rhythm while in clinic.  Maria Arnold plan for repeat ablation.  Risk and benefits have been discussed.  She understands the risks and is agreed to the procedure.  Risk, benefits, and alternatives to EP study and radiofrequency ablation for afib were also discussed in detail today. These risks include but are not limited to stroke, bleeding, vascular damage, tamponade, perforation, damage to the esophagus, lungs, and other structures, pulmonary vein stenosis, worsening renal function, and death. The patient understands these risk and wishes to proceed.  [name].  Carto, ICE, anesthesia are requested for the procedure.  Maria Arnold also obtain CT PV protocol prior to the procedure to exclude LAA thrombus and further evaluate atrial anatomy.  2.  Chronic systolic heart failure: Currently on optimal apical therapy per heart failure cardiology.  No obvious volume overload.  3.  Hypertension: Elevated today, improving on recheck.  Plan per primary physician  Follow up with Dr. Elberta Fortis as usual post procedure  Signed, Maria Edelman Jorja Loa, MD

## 2023-04-30 NOTE — Patient Instructions (Addendum)
Medication Instructions:  Your physician recommends that you continue on your current medications as directed. Please refer to the Current Medication list given to you today.  *If you need a refill on your cardiac medications before your next appointment, please call your pharmacy*   Lab Work: Pre procedure labs 08/27/23 -- see procedure instruction letter:  BMP & CBC  If you have labs (blood work) drawn today and your tests are completely normal, you will receive your results only by: MyChart Message (if you have MyChart) OR A paper copy in the mail If you have any lab test that is abnormal or we need to change your treatment, we will call you to review the results.   Testing/Procedures: Your physician has requested that you have cardiac CT within 7 days PRIOR to your ablation. Cardiac computed tomography (CT) is a painless test that uses an x-ray machine to take clear, detailed pictures of your heart.  Please follow instruction below located under "other instructions". You will get a call from our office to schedule the date for this test.  Your physician has recommended that you have an ablation. Catheter ablation is a medical procedure used to treat some cardiac arrhythmias (irregular heartbeats). During catheter ablation, a long, thin, flexible tube is put into a blood vessel in your groin (upper thigh), or neck. This tube is called an ablation catheter. It is then guided to your heart through the blood vessel. Radio frequency waves destroy small areas of heart tissue where abnormal heartbeats may cause an arrhythmia to start.   You will be scheduled for 09/10/2023,  the EP procedure scheduler, April, will be in contact with instruction letters   Follow-Up: At [facility], you and your health needs are our priority.  As part of our continuing mission to provide you with exceptional heart care, we have created designated Provider Care Teams.  These Care Teams include your primary  Cardiologist (physician) and Advanced Practice Providers (APPs -  Physician Assistants and Nurse Practitioners) who all work together to provide you with the care you need, when you need it.   Your next appointment:   1 month(s) after your ablation  The format for your next appointment:   In Person  Provider:   [facility]   Thank you for choosing [facility]!!   Dory Horn, RN [phone]    Other Instructions   Cardiac Ablation Cardiac ablation is a procedure to destroy (ablate) some heart tissue that is sending bad signals. These bad signals cause problems in heart rhythm. The heart has many areas that make these signals. If there are problems in these areas, they can make the heart beat in a way that is not normal. Destroying some tissues can help make the heart rhythm normal. Tell your doctor about: Any allergies you have. All medicines you are taking. These include vitamins, herbs, eye drops, creams, and over-the-counter medicines. Any problems you or family members have had with medicines that make you fall asleep (anesthetics). Any blood disorders you have. Any surgeries you have had. Any medical conditions you have, such as kidney failure. Whether you are pregnant or may be pregnant. What are the risks? This is a safe procedure. But problems may occur, including: Infection. Bruising and bleeding. Bleeding into the chest. Stroke or blood clots. Damage to nearby areas of your body. Allergies to medicines or dyes. The need for a pacemaker if the normal system is damaged. Failure of the procedure to treat the problem. What happens  before the procedure? Medicines Ask your doctor about: Changing or stopping your normal medicines. This is important. Taking aspirin and ibuprofen. Do not take these medicines unless your doctor tells you to take them. Taking other medicines, vitamins, herbs, and supplements. General instructions Follow instructions from your  doctor about what you cannot eat or drink. Plan to have someone take you home from the hospital or clinic. If you will be going home right after the procedure, plan to have someone with you for 24 hours. Ask your doctor what steps will be taken to prevent infection. What happens during the procedure?  An IV tube will be put into one of your veins. You will be given a medicine to help you relax. The skin on your neck or groin will be numbed. A cut (incision) will be made in your neck or groin. A needle will be put through your cut and into a large vein. A tube (catheter) will be put into the needle. The tube will be moved to your heart. Dye may be put through the tube. This helps your doctor see your heart. Small devices (electrodes) on the tube will send out signals. A type of energy will be used to destroy some heart tissue. The tube will be taken out. Pressure will be held on your cut. This helps stop bleeding. A bandage will be put over your cut. The exact procedure may vary among doctors and hospitals. What happens after the procedure? You will be watched until you leave the hospital or clinic. This includes checking your heart rate, breathing rate, oxygen, and blood pressure. Your cut will be watched for bleeding. You will need to lie still for a few hours. Do not drive for 24 hours or as long as your doctor tells you. Summary Cardiac ablation is a procedure to destroy some heart tissue. This is done to treat heart rhythm problems. Tell your doctor about any medical conditions you may have. Tell him or her about all medicines you are taking to treat them. This is a safe procedure. But problems may occur. These include infection, bruising, bleeding, and damage to nearby areas of your body. Follow what your doctor tells you about food and drink. You may also be told to change or stop some of your medicines. After the procedure, do not drive for 24 hours or as long as your doctor tells  you. This information is not intended to replace advice given to you by your health care provider. Make sure you discuss any questions you have with your health care provider. Document Revised: 01/16/2022 Document Reviewed: 09/28/2019 Elsevier Patient Education  2023 [employer].

## 2023-05-06 ENCOUNTER — Other Ambulatory Visit (HOSPITAL_COMMUNITY): Payer: Self-pay | Admitting: Cardiology

## 2023-05-06 MED ORDER — FUROSEMIDE 20 MG PO TABS: 20.0000 mg | ORAL_TABLET | ORAL | 11 refills | Status: DC | PRN

## 2023-05-06 NOTE — Telephone Encounter (Signed)
Pt called to report her PRN script of lasix is needed at pharmacy -reports she was told to continue at followup -reports she taking PRN swelling~4 days  Per OV 04/23/23 with Shanda Bumps [place],NP  - Continue Lasix PRN   Script sent

## 2023-05-07 ENCOUNTER — Encounter (HOSPITAL_COMMUNITY): Payer: Medicare Other | Admitting: Cardiology

## 2023-05-18 ENCOUNTER — Encounter (HOSPITAL_COMMUNITY): Payer: Medicare Other | Admitting: Cardiology

## 2023-06-14 ENCOUNTER — Telehealth: Payer: Self-pay

## 2023-06-14 NOTE — Telephone Encounter (Signed)
LM for pt to call back to offer her 9/19 procedure date for her Ablation.

## 2023-06-21 ENCOUNTER — Telehealth: Payer: Self-pay

## 2023-06-21 NOTE — Transitions of Care (Post Inpatient/ED Visit) (Signed)
   06/21/2023  Name: ARGELIA THUROW MRN: [id number] DOB: 05-19-1946  Today's TOC FU Call Status: Today's TOC FU Call Status:: Unsuccessful Call (1st Attempt) Unsuccessful Call (1st Attempt) Date: 06/21/23  Attempted to reach the patient regarding the most recent Inpatient/ED visit.  Follow Up Plan: Additional outreach attempts will be made to reach the patient to complete the Transitions of Care (Post Inpatient/ED visit) call.   Signature Karena Addison, LPN [facility] Nurse Health Advisor Direct Dial [phone]

## 2023-06-22 NOTE — Transitions of Care (Post Inpatient/ED Visit) (Signed)
   06/22/2023  Name: Maria Arnold MRN: [id number] DOB: 03/08/1946  Today's TOC FU Call Status: Today's TOC FU Call Status:: Unsuccessful Call (2nd Attempt) Unsuccessful Call (1st Attempt) Date: 06/21/23 Unsuccessful Call (2nd Attempt) Date: 06/22/23  Attempted to reach the patient regarding the most recent Inpatient/ED visit.  Follow Up Plan: Additional outreach attempts will be made to reach the patient to complete the Transitions of Care (Post Inpatient/ED visit) call.   Signature Karena Addison, LPN [facility] Nurse Health Advisor Direct Dial [phone]

## 2023-07-11 DEATH — deceased

## 2023-08-03 ENCOUNTER — Other Ambulatory Visit (HOSPITAL_COMMUNITY): Payer: Medicare Other

## 2023-08-06 ENCOUNTER — Encounter (HOSPITAL_COMMUNITY): Payer: Medicare Other | Admitting: Cardiology

## 2023-08-27 ENCOUNTER — Other Ambulatory Visit: Payer: Medicare Other

## 2023-09-03 ENCOUNTER — Other Ambulatory Visit (HOSPITAL_COMMUNITY): Payer: Medicare Other

## 2023-09-10 ENCOUNTER — Encounter (HOSPITAL_COMMUNITY): Payer: Self-pay

## 2023-09-10 ENCOUNTER — Ambulatory Visit (HOSPITAL_COMMUNITY): Admit: 2023-09-10 | Payer: Medicare Other | Admitting: Cardiology

## 2023-09-10 SURGERY — ATRIAL FIBRILLATION ABLATION
Anesthesia: General
